# Patient Record
Sex: Male | Born: 1940 | Race: White | Hispanic: No | Marital: Married | Smoking: Current every day smoker
Health system: Southern US, Community
[De-identification: ages and names within clinical notes are randomized; demographics above are authoritative.]

## PROBLEM LIST (undated history)

## (undated) DIAGNOSIS — M1990 Unspecified osteoarthritis, unspecified site: Secondary | ICD-10-CM

## (undated) DIAGNOSIS — K219 Gastro-esophageal reflux disease without esophagitis: Secondary | ICD-10-CM

## (undated) DIAGNOSIS — I714 Abdominal aortic aneurysm, without rupture, unspecified: Secondary | ICD-10-CM

## (undated) DIAGNOSIS — I10 Essential (primary) hypertension: Secondary | ICD-10-CM

## (undated) DIAGNOSIS — R911 Solitary pulmonary nodule: Secondary | ICD-10-CM

## (undated) DIAGNOSIS — G459 Transient cerebral ischemic attack, unspecified: Secondary | ICD-10-CM

## (undated) DIAGNOSIS — E785 Hyperlipidemia, unspecified: Secondary | ICD-10-CM

## (undated) HISTORY — PX: MULTIPLE TOOTH EXTRACTIONS: SHX2053

## (undated) HISTORY — PX: CHOLECYSTECTOMY: SHX55

## (undated) HISTORY — PX: APPENDECTOMY: SHX54

## (undated) HISTORY — PX: BACK SURGERY: SHX140

---

## 2002-02-03 ENCOUNTER — Ambulatory Visit (HOSPITAL_COMMUNITY): Admission: RE | Admit: 2002-02-03 | Discharge: 2002-02-03 | Payer: Self-pay | Admitting: Internal Medicine

## 2003-06-24 ENCOUNTER — Ambulatory Visit (HOSPITAL_COMMUNITY): Admission: RE | Admit: 2003-06-24 | Discharge: 2003-06-24 | Payer: Self-pay | Admitting: Unknown Physician Specialty

## 2003-07-07 ENCOUNTER — Ambulatory Visit (HOSPITAL_COMMUNITY): Admission: RE | Admit: 2003-07-07 | Discharge: 2003-07-07 | Payer: Self-pay | Admitting: Unknown Physician Specialty

## 2003-08-19 ENCOUNTER — Encounter (INDEPENDENT_AMBULATORY_CARE_PROVIDER_SITE_OTHER): Payer: Self-pay | Admitting: *Deleted

## 2003-08-19 ENCOUNTER — Ambulatory Visit (HOSPITAL_COMMUNITY): Admission: RE | Admit: 2003-08-19 | Discharge: 2003-08-20 | Payer: Self-pay

## 2003-10-07 ENCOUNTER — Ambulatory Visit (HOSPITAL_COMMUNITY): Admission: RE | Admit: 2003-10-07 | Discharge: 2003-10-07 | Payer: Self-pay | Admitting: Unknown Physician Specialty

## 2004-01-07 ENCOUNTER — Ambulatory Visit (HOSPITAL_COMMUNITY): Admission: RE | Admit: 2004-01-07 | Discharge: 2004-01-07 | Payer: Self-pay | Admitting: Unknown Physician Specialty

## 2004-01-12 ENCOUNTER — Encounter: Admission: RE | Admit: 2004-01-12 | Discharge: 2004-02-10 | Payer: Self-pay | Admitting: Orthopaedic Surgery

## 2004-01-27 ENCOUNTER — Ambulatory Visit (HOSPITAL_COMMUNITY): Admission: RE | Admit: 2004-01-27 | Discharge: 2004-01-27 | Payer: Self-pay | Admitting: Family Medicine

## 2004-02-24 ENCOUNTER — Encounter: Admission: RE | Admit: 2004-02-24 | Discharge: 2004-02-24 | Payer: Self-pay | Admitting: Orthopaedic Surgery

## 2004-03-30 ENCOUNTER — Ambulatory Visit (HOSPITAL_COMMUNITY): Admission: RE | Admit: 2004-03-30 | Discharge: 2004-03-30 | Payer: Self-pay | Admitting: Orthopaedic Surgery

## 2004-04-27 ENCOUNTER — Ambulatory Visit: Payer: Self-pay | Admitting: Family Medicine

## 2004-06-01 ENCOUNTER — Ambulatory Visit: Payer: Self-pay | Admitting: Family Medicine

## 2004-07-11 ENCOUNTER — Ambulatory Visit (HOSPITAL_COMMUNITY): Admission: RE | Admit: 2004-07-11 | Discharge: 2004-07-11 | Payer: Self-pay | Admitting: Family Medicine

## 2004-07-28 ENCOUNTER — Ambulatory Visit: Payer: Self-pay | Admitting: Family Medicine

## 2004-09-13 ENCOUNTER — Ambulatory Visit: Payer: Self-pay | Admitting: Family Medicine

## 2004-10-04 ENCOUNTER — Ambulatory Visit: Payer: Self-pay | Admitting: Family Medicine

## 2004-12-29 ENCOUNTER — Ambulatory Visit: Payer: Self-pay | Admitting: Family Medicine

## 2005-01-25 ENCOUNTER — Ambulatory Visit: Payer: Self-pay | Admitting: Family Medicine

## 2005-02-03 ENCOUNTER — Ambulatory Visit (HOSPITAL_COMMUNITY): Admission: RE | Admit: 2005-02-03 | Discharge: 2005-02-03 | Payer: Self-pay | Admitting: Family Medicine

## 2005-03-14 ENCOUNTER — Ambulatory Visit: Payer: Self-pay | Admitting: Family Medicine

## 2005-05-02 ENCOUNTER — Ambulatory Visit: Payer: Self-pay | Admitting: Family Medicine

## 2005-05-10 ENCOUNTER — Ambulatory Visit (HOSPITAL_COMMUNITY): Admission: RE | Admit: 2005-05-10 | Discharge: 2005-05-10 | Payer: Self-pay | Admitting: Family Medicine

## 2005-05-23 ENCOUNTER — Ambulatory Visit: Payer: Self-pay | Admitting: Family Medicine

## 2005-08-29 ENCOUNTER — Ambulatory Visit: Payer: Self-pay | Admitting: Family Medicine

## 2005-12-05 ENCOUNTER — Ambulatory Visit: Payer: Self-pay | Admitting: Family Medicine

## 2005-12-27 ENCOUNTER — Ambulatory Visit (HOSPITAL_COMMUNITY): Admission: RE | Admit: 2005-12-27 | Discharge: 2005-12-27 | Payer: Self-pay | Admitting: Family Medicine

## 2006-01-02 ENCOUNTER — Ambulatory Visit (HOSPITAL_COMMUNITY): Admission: RE | Admit: 2006-01-02 | Discharge: 2006-01-02 | Payer: Self-pay | Admitting: Family Medicine

## 2006-02-09 ENCOUNTER — Ambulatory Visit: Payer: Self-pay | Admitting: Family Medicine

## 2006-04-12 ENCOUNTER — Ambulatory Visit: Payer: Self-pay | Admitting: Family Medicine

## 2006-05-24 ENCOUNTER — Ambulatory Visit: Payer: Self-pay | Admitting: Family Medicine

## 2006-08-22 ENCOUNTER — Ambulatory Visit: Payer: Self-pay | Admitting: Family Medicine

## 2007-07-11 ENCOUNTER — Ambulatory Visit (HOSPITAL_COMMUNITY): Admission: RE | Admit: 2007-07-11 | Discharge: 2007-07-11 | Payer: Self-pay | Admitting: Family Medicine

## 2007-08-06 ENCOUNTER — Ambulatory Visit (HOSPITAL_COMMUNITY): Admission: RE | Admit: 2007-08-06 | Discharge: 2007-08-06 | Payer: Self-pay | Admitting: Family Medicine

## 2007-11-06 ENCOUNTER — Ambulatory Visit (HOSPITAL_COMMUNITY): Admission: RE | Admit: 2007-11-06 | Discharge: 2007-11-06 | Payer: Self-pay | Admitting: Family Medicine

## 2008-06-11 ENCOUNTER — Ambulatory Visit (HOSPITAL_COMMUNITY): Admission: RE | Admit: 2008-06-11 | Discharge: 2008-06-11 | Payer: Self-pay | Admitting: Family Medicine

## 2009-08-03 ENCOUNTER — Ambulatory Visit: Payer: Self-pay | Admitting: Vascular Surgery

## 2010-05-01 ENCOUNTER — Encounter: Payer: Self-pay | Admitting: Family Medicine

## 2010-08-23 NOTE — Consult Note (Signed)
NEW PATIENT CONSULTATION   Raulerson, Jaidan B  DOB:  1941/01/26                                       08/03/2009  YQMVH#:[id number]   The patient is a 70 year old male patient seen by me several years ago  for a small infrarenal abdominal aortic aneurysm which at that time  measured 3.4 cm in maximum diameter.  He was referred today for  continued examination of aneurysmal disease by Dr. Lysbeth Galas who recently  obtained a CT scan at [facility] of the abdomen and chest.  I  have reviewed that CT scan today as well as the report and also have  reviewed all the medical records supplied by Dr. Lysbeth Galas.  The CT scan  today reveals the maximal infrarenal aortic diameter to be 3.5 cm with  no evidence of leak or rupture and no evidence of thoracic aneurysm.  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   CHRONIC MEDICAL PROBLEMS:  1. Hypertension.  2. Hyperlipidemia.  3. History of TIAs.  4. Tobacco abuse.  5. Lumbar spine disease.  6. GERD.   FAMILY HISTORY:  Positive for coronary artery disease with massive  myocardial infarction as well as diabetes in his mother, negative for  stroke.   SOCIAL HISTORY:  He is married, has three children and is a retired  Psychologist, occupational.  He smoked a pack and a half of cigarettes per day for 50+ years  and now smokes a half pack per day.  Does not use alcohol.   REVIEW OF SYSTEMS:  Positive for dyspnea on exertion, esophageal reflux  with heartburn, diarrhea, history of mini stroke consisting of dizziness  and diplopia now resolved.  History of diffuse arthritis, joint pain and  decreased hearing.  All other systems in review of systems are negative.   PHYSICAL EXAM:  Vital signs:  Blood pressure 140/76, heart rate 64,  temperature 98.  General:  He is a well-developed, well-nourished male  who is in no  apparent distress, alert and oriented x3.  HEENT:  Exam is  normal.  EOMs intact.  Chest:  Clear to auscultation.  No wheezing.  Cardiovascular:  Regular rhythm.  No murmurs.  Carotid pulses 3+.  No  audible bruits.  Lower extremity exam reveals 3+ femoral and dorsalis  pedis pulses bilaterally with no edema.  Abdomen:  Soft with a prominent  diastasis recti.  No pulsatile masses palpable.  No tenderness.  Musculoskeletal:  Free of major deformities.  Neurological:  Normal.  Skin:  Free of rashes.   I think this patient does have a stable infrarenal abdominal aortic  aneurysm and he has no other symptoms of vascular occlusive disease at  this time.  We will follow him every 2 years in our vascular lab on the  aneurysm protocol with an ultrasound to check for any enlargement unless  he develops any new symptoms in the interim.     Quita Skye Hart [place], M.D.  Electronically Signed   JDL/MEDQ  D:  08/03/2009  T:  08/04/2009  Job:  [id number]   cc:   Delaney Meigs, M.D.

## 2010-08-26 NOTE — Op Note (Signed)
NAME:  Andrew Hunt, Andrew Hunt                        ACCOUNT NO.:  [id number]   MEDICAL RECORD NO.:  [id number]                   PATIENT TYPE:  AMB   LOCATION:  DAY                                  FACILITY:  [facility]   PHYSICIAN:  R. Roetta Sessions, M.D.              DATE OF BIRTH:  [birth year]   DATE OF PROCEDURE:  02/03/2002  DATE OF DISCHARGE:                                 OPERATIVE REPORT   PROCEDURE:  Screening colonoscopy.   ENDOSCOPIST:  Gerrit Friends. Rourk, M.D.   INDICATIONS FOR PROCEDURE:  The patient is a 70 year old male who comes for  colon cancer screening.  He has some left-sided abdominal pain and thought  to have diverticula previously. He was last seen 01/02/02 in my office.  He  was having some suprapubic pain and he underwent a CT scan which revealed  some diverticula tags, but no evidence of diverticulitis or other  significant abnormality.  He comes for a screening colonoscopy.  This  approach has been discussed with the patient previously, and again at the  bedside.  Please see my office dictation of 01/02/02 and handwritten update  and H&P on 02/03/02.  ASA II.   DESCRIPTION OF PROCEDURE:  O2 saturation, blood pressure, pulse and  respirations were monitored throughout the entire procedure.   CONSCIOUS SEDATION:  Demerol 75 mg IV and Versed 3 gm IV in divided doses.   INSTRUMENT:  Olympus video chip colonoscope.   FINDINGS:  Digital rectal exam revealed no abnormalities.   ENDOSCOPIC FINDINGS:  The prep was adequate.   RECTUM:  Examination of the rectal mucosa including the retroflex view of  the anal verge revealed no abnormalities.   COLON:  The colonic mucosa was surveyed from the rectosigmoid junction  through the left transverse and right colon to the area of the appendiceal  orifice, ileocecal valve/cecum.  These structures were well seen and  photographed for the record.  The patient was noted to have a few scattered  left-sided diverticula.  The  remainder of the colonic mucosa appeared  normal.   From the level of the cecum and ileocecal valve the scope was slowly  withdrawn; and all previously mentioned mucosal surfaces were again seen.  No abnormalities were observed.  The patient tolerated the procedure well  and was reacted in endoscopy.   IMPRESSION:  1. Normal rectum.  2. Scattered left-sided diverticula, the remainder fo the colonic mucosa     appeared normal.   RECOMMENDATIONS:  1. Diverticulosis literature provided to the patient. He is to increase the     fiber intake in his diet daily.  2. Repeat colonoscopy in 10 years.  Jonathon Bellows, M.D.    RMR/MEDQ  D:  02/03/2002  T:  02/03/2002  Job:  [id number]   cc:   Colon Flattery  [address]  [place]  [place] [address]  Fax: [phone]

## 2010-08-26 NOTE — Op Note (Signed)
NAME:  Andrew Hunt, Andrew Hunt                        ACCOUNT NO.:  [id number]   MEDICAL RECORD NO.:  [id number]                   PATIENT TYPE:  OIB   LOCATION:  5707                                 FACILITY:  [facility]   PHYSICIAN:  Lorre Munroe., M.D.            DATE OF BIRTH:  1940/08/10   DATE OF PROCEDURE:  08/19/2003  DATE OF DISCHARGE:                                 OPERATIVE REPORT   PREOPERATIVE DIAGNOSIS:  Symptomatic gallstones.   POSTOPERATIVE DIAGNOSIS:  Symptomatic gallstones.   OPERATION PERFORMED:  Laparoscopic cholecystectomy.   SURGEON:  Lebron Conners, M.D.   ASSISTANT:  Anselm Pancoast. Zachery Dakins, M.D.   ANESTHESIA:  General.   DESCRIPTION OF PROCEDURE:  After the patient was monitored and anesthetized  and had routine preparation and draping of the abdomen, I infiltrated local  anesthetic just beneath the umbilicus at one spot in the epigastrium and two  spots in the right mid-abdomen.  I made incisions at all of those locations  and first dissected down through the subcutaneous tissues just below the  umbilicus and incised the fascia longitudinally in the midline, entered the  peritoneum bluntly and placed a 0 Vicryl pursestring suture in the fascia.  After securing a Hasson cannula, I inflated the abdomen with CO2 and  examined the contents.  I saw no abnormalities except for adhesions of  omentum to the undersurface of the gallbladder.  I placed the three  additional ports under direct vision then retracted the fundus of the  gallbladder toward the right shoulder and took down the adhesions bluntly  and with cautery.  I then grasped the infundibulum of the gallbladder and  pulled it laterally and dissected out the triangle of Calot, noting the  cystic artery and clipped two branches with three clips each and cut each  between the two clips closest to the gallbladder.  I had a very large window  in the triangle of Calot and I could clearly see the cystic duct  emerging  from the infundibulum of the gallbladder.  I clipped the cystic duct with  four clips and cut between the two closest to the gallbladder.  I then  dissected the gallbladder from the liver using the hook cautery instrument.  I removed the gallbladder intact.  There was no significant bleeding and  hemostasis was excellent when I finished.  The clips appeared to be secure.  I removed the gallbladder from the body through the umbilical incision and  tied the pursestring suture.  I then  removed the lateral ports under direct vision and let the CO2 escape from  the epigastric port and removed it.  I closed all skin incisions with  intracuticular 4-0 Vicryl and Steri-Strips and applied bandages.  The  patient tolerated the operation well.  Lorre Munroe., M.D.    WB/MEDQ  D:  08/19/2003  T:  08/19/2003  Job:  [id number]   cc:   Colon Flattery, D.O.  [address]  [place]  [place] [address]  Fax: [phone]

## 2011-10-31 ENCOUNTER — Other Ambulatory Visit (HOSPITAL_COMMUNITY): Payer: Self-pay | Admitting: Adult Health Nurse Practitioner

## 2011-10-31 DIAGNOSIS — I714 Abdominal aortic aneurysm, without rupture: Secondary | ICD-10-CM

## 2011-11-02 ENCOUNTER — Ambulatory Visit (HOSPITAL_COMMUNITY)
Admission: RE | Admit: 2011-11-02 | Discharge: 2011-11-02 | Disposition: A | Discharge status: Home / Self Care | Payer: Medicare Other | Source: Ambulatory Visit | Attending: Adult Health Nurse Practitioner | Admitting: Adult Health Nurse Practitioner

## 2011-11-02 ENCOUNTER — Encounter (HOSPITAL_COMMUNITY): Payer: Self-pay

## 2011-11-02 DIAGNOSIS — I714 Abdominal aortic aneurysm, without rupture, unspecified: Secondary | ICD-10-CM | POA: Insufficient documentation

## 2011-11-02 MED ORDER — IOHEXOL 300 MG/ML  SOLN
100.0000 mL | Freq: Once | INTRAMUSCULAR | Status: AC | PRN
  Administered 2011-11-02: 100 mL via INTRAVENOUS

## 2013-06-19 ENCOUNTER — Telehealth: Payer: Self-pay

## 2013-06-19 ENCOUNTER — Telehealth: Payer: Self-pay | Admitting: Internal Medicine

## 2013-06-19 NOTE — Telephone Encounter (Signed)
Gastroenterology Pre-Procedure Review  Request Date: Requesting Physician: RMRM  PATIENT REVIEW QUESTIONS: The patient responded to the following health history questions as indicated:    1. Diabetes Melitis: NO 2. Joint replacements in the past 12 months: NO 3. Major health problems in the past 3 months: NO 4. Has an artificial valve or MVP: NO 5. Has a defibrillator: NO 6. Has been advised in past to take antibiotics in advance of a procedure like teeth cleaning: NO 7.Family history: NO 8. Acholic: NO     MEDICATIONS & ALLERGIES:   Mobic-  nausea Patient reports the following regarding taking any blood thinners:   Plavix? NO-stop in October Aspirin? NO Coumadin? NO  Patient confirms/reports the following medications:  No current outpatient prescriptions on file.   No current facility-administered medications for this visit.    Patient confirms/reports the following allergies:  Mobic  No orders of the defined types were placed in this encounter.    AUTHORIZATION INFORMATION Primary Insurance: Medicare  ID #:[id number] ,  Group #:  Pre-Cert / Auth required: Pre-Cert / Auth #:   Secondary Insurance: Medicaid,  ID #:[id number] , Group #:  Pre-Cert / Auth required:  Pre-Cert / Auth #:  SCHEDULE INFORMATION: Procedure has been scheduled as follows:  Date: , Time:   Location:   This Gastroenterology Pre-Precedure Review Form is being routed to the following provider(s):

## 2013-06-19 NOTE — Telephone Encounter (Signed)
Pt's wife was calling to set up colonoscopy and she said he was past due. Not sure if he needs OV first or if he can be a triage. Please advise [phone]

## 2013-06-23 ENCOUNTER — Other Ambulatory Visit: Payer: Self-pay

## 2013-06-23 ENCOUNTER — Encounter (HOSPITAL_COMMUNITY): Payer: Self-pay | Admitting: Pharmacy Technician

## 2013-06-23 DIAGNOSIS — Z1211 Encounter for screening for malignant neoplasm of colon: Secondary | ICD-10-CM

## 2013-06-23 NOTE — Telephone Encounter (Signed)
Pt is scheduled for tcs on 07/08/13 at 10:30am.  Pts wife is aware of appt.   Doris, please send Rx to [facility] in [place].   Routing to [place] for completion.

## 2013-06-23 NOTE — Telephone Encounter (Signed)
LMOM for pt to call to schedule the colonoscopy.  

## 2013-06-23 NOTE — Telephone Encounter (Signed)
See separate triage.  

## 2013-06-24 NOTE — Telephone Encounter (Signed)
Appropriate.

## 2013-06-25 MED ORDER — PEG-KCL-NACL-NASULF-NA ASC-C 100 G PO SOLR: 1.0000 | ORAL | Status: DC

## 2013-06-25 NOTE — Telephone Encounter (Signed)
Rx sent to the pharmacy and instructions mailed to pt.  

## 2013-06-25 NOTE — Addendum Note (Signed)
Addended by: Everardo All on: 06/25/2013 04:38 PM   Modules accepted: Orders

## 2013-07-07 ENCOUNTER — Telehealth: Payer: Self-pay | Admitting: Internal Medicine

## 2013-07-07 NOTE — Telephone Encounter (Signed)
LMOM to cancel his procedure for tomorrow with RMR due to being sick. VM transferred to nurse that scheduled patient

## 2013-07-07 NOTE — Telephone Encounter (Signed)
I called pt's wife. She said he has a very bad cough and congestion. She canceled his colonoscopy for 07/08/2013 and said she will call back to reschedule after he sees doctor. I called and LMOM for Maudie Mercury that it has been canceled and asked her to call me back to confirm.

## 2013-07-07 NOTE — Telephone Encounter (Signed)
Andrew Hunt returned my call and is aware.

## 2013-07-08 ENCOUNTER — Encounter (HOSPITAL_COMMUNITY): Admission: RE | Payer: Self-pay | Source: Ambulatory Visit

## 2013-07-08 ENCOUNTER — Ambulatory Visit (HOSPITAL_COMMUNITY): Admission: RE | Admit: 2013-07-08 | Payer: Medicare Other | Source: Ambulatory Visit | Admitting: Internal Medicine

## 2013-07-08 SURGERY — COLONOSCOPY
Anesthesia: Moderate Sedation

## 2014-04-22 ENCOUNTER — Ambulatory Visit: Payer: Self-pay | Admitting: Nurse Practitioner

## 2014-04-22 ENCOUNTER — Telehealth: Payer: Self-pay

## 2014-04-22 ENCOUNTER — Encounter: Payer: Self-pay | Admitting: Nurse Practitioner

## 2014-04-22 ENCOUNTER — Other Ambulatory Visit: Payer: Self-pay

## 2014-04-22 VITALS — BP 158/72 | HR 50 | Temp 98.1°F | Ht 66.0 in | Wt 164.0 lb

## 2014-04-22 DIAGNOSIS — Z1211 Encounter for screening for malignant neoplasm of colon: Secondary | ICD-10-CM

## 2014-04-22 NOTE — Telephone Encounter (Addendum)
Gastroenterology Pre-Procedure Review  Request Date:04/22/2014 Requesting Physician: HAD BEEN ON RECALL LAST COLONOSCOPY WAS 02/03/2002 BY DR. Gala Romney AND NEXT RECOMMENDED IN 10 YEARS  PATIENT REVIEW QUESTIONS: The patient responded to the following health history questions as indicated:    1. Diabetes Melitis: no 2. Joint replacements in the past 12 months: no 3. Major health problems in the past 3 months: no 4. Has an artificial valve or MVP: no 5. Has a defibrillator: no 6. Has been advised in past to take antibiotics in advance of a procedure like teeth cleaning: no    MEDICATIONS & ALLERGIES:    Patient reports the following regarding taking any blood thinners:   Plavix? no Aspirin? Yes Coumadin? no  Patient confirms/reports the following medications:  Current Outpatient Prescriptions  Medication Sig Dispense Refill  . amLODipine (NORVASC) 10 MG tablet Take 5 mg by mouth.     Marland Kitchen aspirin EC 81 MG tablet Take 81 mg by mouth every other day.    Marland Kitchen atorvastatin (LIPITOR) 40 MG tablet Take 40 mg by mouth daily.    . nebivolol (BYSTOLIC) 10 MG tablet Take 10 mg by mouth daily.    Marland Kitchen omeprazole (PRILOSEC) 20 MG capsule Take 20 mg by mouth daily.    . tamsulosin (FLOMAX) 0.4 MG CAPS capsule Take 0.4 mg by mouth daily.    . Glucosamine-Chondroit-Vit C-Mn (GLUCOSAMINE 1500 COMPLEX PO) Take 1,500 mg by mouth daily.    . peg 3350 powder (MOVIPREP) 100 G SOLR Take 1 kit (200 g total) by mouth as directed. (Patient not taking: Reported on 04/22/2014) 1 kit 0  . valsartan (DIOVAN) 320 MG tablet Take 320 mg by mouth daily.     No current facility-administered medications for this visit.    Patient confirms/reports the following allergies:  Allergies  Allergen Reactions  . Mobic [Meloxicam] Nausea Only    No orders of the defined types were placed in this encounter.    AUTHORIZATION INFORMATION Primary Insurance:   ID #:  Group #:  Pre-Cert / Auth required: Pre-Cert / Auth #:    Secondary Insurance:   ID #:   Group #:  Pre-Cert / Auth required:  Pre-Cert / Auth #:   SCHEDULE INFORMATION: Procedure has been scheduled as follows:  Date: 05/05/2014             Time: 1:00 pm  Location: [facility] Short Stay  This Gastroenterology Pre-Precedure Review Form is being routed to the following provider(s): R. Garfield Cornea, MD

## 2014-04-24 NOTE — Telephone Encounter (Signed)
Ok to schedule. Ok to continue to take ASA

## 2014-04-28 NOTE — Telephone Encounter (Signed)
Pt received instructions when he was in the office to schedule the colonoscopy.  He already had a prep and it is good til 2018. He did not open it when he had to cancel before.

## 2014-04-30 DIAGNOSIS — Z1211 Encounter for screening for malignant neoplasm of colon: Secondary | ICD-10-CM | POA: Insufficient documentation

## 2014-04-30 NOTE — Assessment & Plan Note (Signed)
No visit needed, phone triage completed. Visit cancelled with no charge

## 2014-04-30 NOTE — Progress Notes (Signed)
Note opened in error, patient visit unnecessary, phone triage completed.Andrew Hunt

## 2014-05-05 ENCOUNTER — Ambulatory Visit (HOSPITAL_COMMUNITY)
Admission: RE | Admit: 2014-05-05 | Discharge: 2014-05-05 | Disposition: A | Discharge status: Home / Self Care | Payer: Medicare Other | Source: Ambulatory Visit | Attending: Internal Medicine | Admitting: Internal Medicine

## 2014-05-05 ENCOUNTER — Encounter (HOSPITAL_COMMUNITY)
Admission: RE | Disposition: A | Discharge status: Home / Self Care | Payer: Self-pay | Source: Ambulatory Visit | Attending: Internal Medicine

## 2014-05-05 ENCOUNTER — Encounter (HOSPITAL_COMMUNITY): Payer: Self-pay | Admitting: *Deleted

## 2014-05-05 DIAGNOSIS — Z79899 Other long term (current) drug therapy: Secondary | ICD-10-CM | POA: Diagnosis not present

## 2014-05-05 DIAGNOSIS — M1990 Unspecified osteoarthritis, unspecified site: Secondary | ICD-10-CM | POA: Insufficient documentation

## 2014-05-05 DIAGNOSIS — D120 Benign neoplasm of cecum: Secondary | ICD-10-CM | POA: Diagnosis not present

## 2014-05-05 DIAGNOSIS — E785 Hyperlipidemia, unspecified: Secondary | ICD-10-CM | POA: Insufficient documentation

## 2014-05-05 DIAGNOSIS — I10 Essential (primary) hypertension: Secondary | ICD-10-CM | POA: Diagnosis not present

## 2014-05-05 DIAGNOSIS — Z1211 Encounter for screening for malignant neoplasm of colon: Secondary | ICD-10-CM | POA: Diagnosis present

## 2014-05-05 DIAGNOSIS — F17210 Nicotine dependence, cigarettes, uncomplicated: Secondary | ICD-10-CM | POA: Diagnosis not present

## 2014-05-05 DIAGNOSIS — K648 Other hemorrhoids: Secondary | ICD-10-CM | POA: Insufficient documentation

## 2014-05-05 DIAGNOSIS — K219 Gastro-esophageal reflux disease without esophagitis: Secondary | ICD-10-CM | POA: Insufficient documentation

## 2014-05-05 DIAGNOSIS — Z8673 Personal history of transient ischemic attack (TIA), and cerebral infarction without residual deficits: Secondary | ICD-10-CM | POA: Insufficient documentation

## 2014-05-05 DIAGNOSIS — Z7982 Long term (current) use of aspirin: Secondary | ICD-10-CM | POA: Insufficient documentation

## 2014-05-05 DIAGNOSIS — Z86010 Personal history of colonic polyps: Secondary | ICD-10-CM | POA: Insufficient documentation

## 2014-05-05 HISTORY — DX: Essential (primary) hypertension: I10

## 2014-05-05 HISTORY — DX: Gastro-esophageal reflux disease without esophagitis: K21.9

## 2014-05-05 HISTORY — PX: COLONOSCOPY: SHX5424

## 2014-05-05 HISTORY — DX: Unspecified osteoarthritis, unspecified site: M19.90

## 2014-05-05 HISTORY — DX: Transient cerebral ischemic attack, unspecified: G45.9

## 2014-05-05 HISTORY — DX: Hyperlipidemia, unspecified: E78.5

## 2014-05-05 SURGERY — COLONOSCOPY
Anesthesia: Moderate Sedation

## 2014-05-05 MED ORDER — MIDAZOLAM HCL 5 MG/5ML IJ SOLN
INTRAMUSCULAR | Status: AC
  Filled 2014-05-05: qty 10

## 2014-05-05 MED ORDER — SODIUM CHLORIDE 0.9 % IV SOLN
INTRAVENOUS | Status: DC
  Administered 2014-05-05: 1000 mL via INTRAVENOUS

## 2014-05-05 MED ORDER — MEPERIDINE HCL 100 MG/ML IJ SOLN
INTRAMUSCULAR | Status: AC
  Filled 2014-05-05: qty 2

## 2014-05-05 MED ORDER — MIDAZOLAM HCL 5 MG/5ML IJ SOLN
INTRAMUSCULAR | Status: DC | PRN
  Administered 2014-05-05 (×2): 1 mg via INTRAVENOUS
  Administered 2014-05-05: 2 mg via INTRAVENOUS
  Administered 2014-05-05: 1 mg via INTRAVENOUS

## 2014-05-05 MED ORDER — STERILE WATER FOR IRRIGATION IR SOLN
Status: DC | PRN
  Administered 2014-05-05: 14:00:00

## 2014-05-05 MED ORDER — MEPERIDINE HCL 100 MG/ML IJ SOLN
INTRAMUSCULAR | Status: DC | PRN
  Administered 2014-05-05 (×2): 25 mg via INTRAVENOUS

## 2014-05-05 MED ORDER — ONDANSETRON HCL 4 MG/2ML IJ SOLN
INTRAMUSCULAR | Status: AC
  Filled 2014-05-05: qty 2

## 2014-05-05 MED ORDER — ONDANSETRON HCL 4 MG/2ML IJ SOLN
INTRAMUSCULAR | Status: DC | PRN
  Administered 2014-05-05: 4 mg via INTRAVENOUS

## 2014-05-05 NOTE — Discharge Instructions (Addendum)
°Colonoscopy °Discharge Instructions ° °Read the instructions outlined below and refer to this sheet in the next few weeks. These discharge instructions provide you with general information on caring for yourself after you leave the hospital. Your doctor may also give you specific instructions. While your treatment has been planned according to the most current medical practices available, unavoidable complications occasionally occur. If you have any problems or questions after discharge, call Dr. Rourk at [phone]. °ACTIVITY °· You may resume your regular activity, but move at a slower pace for the next 24 hours.  °· Take frequent rest periods for the next 24 hours.  °· Walking will help get rid of the air and reduce the bloated feeling in your belly (abdomen).  °· No driving for 24 hours (because of the medicine (anesthesia) used during the test).   °· Do not sign any important legal documents or operate any machinery for 24 hours (because of the anesthesia used during the test).  °NUTRITION °· Drink plenty of fluids.  °· You may resume your normal diet as instructed by your doctor.  °· Begin with a light meal and progress to your normal diet. Heavy or fried foods are harder to digest and may make you feel sick to your stomach (nauseated).  °· Avoid alcoholic beverages for 24 hours or as instructed.  °MEDICATIONS °· You may resume your normal medications unless your doctor tells you otherwise.  °WHAT YOU CAN EXPECT TODAY °· Some feelings of bloating in the abdomen.  °· Passage of more gas than usual.  °· Spotting of blood in your stool or on the toilet paper.  °IF YOU HAD POLYPS REMOVED DURING THE COLONOSCOPY: °· No aspirin products for 7 days or as instructed.  °· No alcohol for 7 days or as instructed.  °· Eat a soft diet for the next 24 hours.  °FINDING OUT THE RESULTS OF YOUR TEST °Not all test results are available during your visit. If your test results are not back during the visit, make an appointment  with your caregiver to find out the results. Do not assume everything is normal if you have not heard from your caregiver or the medical facility. It is important for you to follow up on all of your test results.  °SEEK IMMEDIATE MEDICAL ATTENTION IF: °· You have more than a spotting of blood in your stool.  °· Your belly is swollen (abdominal distention).  °· You are nauseated or vomiting.  °· You have a temperature over 101.  °· You have abdominal pain or discomfort that is severe or gets worse throughout the day.  ° ° °Polyp information provided ° °Further recommendations to follow pending review of pathology report ° °Colon Polyps °Polyps are lumps of extra tissue growing inside the body. Polyps can grow in the large intestine (colon). Most colon polyps are noncancerous (benign). However, some colon polyps can become cancerous over time. Polyps that are larger than a pea may be harmful. To be safe, caregivers remove and test all polyps. °CAUSES  °Polyps form when mutations in the genes cause your cells to grow and divide even though no more tissue is needed. °RISK FACTORS °There are a number of risk factors that can increase your chances of getting colon polyps. They include: °· Being older than 50 years. °· Family history of colon polyps or colon cancer. °· Long-term colon diseases, such as colitis or Crohn disease. °· Being overweight. °· Smoking. °· Being inactive. °· Drinking too much alcohol. °SYMPTOMS  °  Most small polyps do not cause symptoms. If symptoms are present, they may include: °· Blood in the stool. The stool may look dark red or black. °· Constipation or diarrhea that lasts longer than 1 week. °DIAGNOSIS °People often do not know they have polyps until their caregiver finds them during a regular checkup. Your caregiver can use 4 tests to check for polyps: °· Digital rectal exam. The caregiver wears gloves and feels inside the rectum. This test would find polyps only in the rectum. °· Barium  enema. The caregiver puts a liquid called barium into your rectum before taking X-rays of your colon. Barium makes your colon look white. Polyps are dark, so they are easy to see in the X-ray pictures. °· Sigmoidoscopy. A thin, flexible tube (sigmoidoscope) is placed into your rectum. The sigmoidoscope has a light and tiny camera in it. The caregiver uses the sigmoidoscope to look at the last third of your colon. °· Colonoscopy. This test is like sigmoidoscopy, but the caregiver looks at the entire colon. This is the most common method for finding and removing polyps. °TREATMENT  °Any polyps will be removed during a sigmoidoscopy or colonoscopy. The polyps are then tested for cancer. °PREVENTION  °To help lower your risk of getting more colon polyps: °· Eat plenty of fruits and vegetables. Avoid eating fatty foods. °· Do not smoke. °· Avoid drinking alcohol. °· Exercise every day. °· Lose weight if recommended by your caregiver. °· Eat plenty of calcium and folate. Foods that are rich in calcium include milk, cheese, and broccoli. Foods that are rich in folate include chickpeas, kidney beans, and spinach. °HOME CARE INSTRUCTIONS °Keep all follow-up appointments as directed by your caregiver. You may need periodic exams to check for polyps. °SEEK MEDICAL CARE IF: °You notice bleeding during a bowel movement. °Document Released: 12/22/2003 Document Revised: 06/19/2011 Document Reviewed: 06/06/2011 °[employer]® Patient Information ©2015 [employer]. This information is not intended to replace advice given to you by your health care provider. Make sure you discuss any questions you have with your health care provider. ° °

## 2014-05-05 NOTE — H&P (Addendum)
@LOGO @   Primary Care Physician:  Sherrie Mustache, MD Primary Gastroenterologist:  Dr.   Pre-Procedure History & Physical: HPI:  Andrew Hunt is a 74 y.o. male is here for a screening colonoscopy. No bowel symptoms. No family history of colon cancer. Last colonoscopy -13 years ago.  Past Medical History  Diagnosis Date  . Hypertension   . TIA (transient ischemic attack)   . Hyperlipidemia   . GERD (gastroesophageal reflux disease)   . Arthritis     Past Surgical History  Procedure Laterality Date  . Appendectomy    . Cholecystectomy    . Back surgery    . Multiple tooth extractions      Prior to Admission medications   Medication Sig Start Date End Date Taking? Authorizing Provider  amLODipine (NORVASC) 10 MG tablet Take 5 mg by mouth daily.    Yes Historical Provider, MD  aspirin EC 81 MG tablet Take 81 mg by mouth every other day.   Yes Historical Provider, MD  atorvastatin (LIPITOR) 40 MG tablet Take 40 mg by mouth daily.   Yes Historical Provider, MD  Glucosamine-Chondroit-Vit C-Mn (GLUCOSAMINE 1500 COMPLEX PO) Take 1,500 mg by mouth daily.   Yes Historical Provider, MD  nebivolol (BYSTOLIC) 10 MG tablet Take 10 mg by mouth daily.   Yes Historical Provider, MD  omeprazole (PRILOSEC) 20 MG capsule Take 20 mg by mouth daily.   Yes Historical Provider, MD  tamsulosin (FLOMAX) 0.4 MG CAPS capsule Take 0.4 mg by mouth daily.   Yes Historical Provider, MD  valsartan (DIOVAN) 320 MG tablet Take 320 mg by mouth daily.   Yes Historical Provider, MD  peg 3350 powder (MOVIPREP) 100 G SOLR Take 1 kit (200 g total) by mouth as directed. Patient not taking: Reported on 04/22/2014 06/25/13   Daneil Dolin, MD    Allergies as of 04/22/2014 - Review Complete 04/22/2014  Allergen Reaction Noted  . Mobic [meloxicam] Nausea Only 06/19/2013    History reviewed. No pertinent family history.  History   Social History  . Marital Status: Married    Spouse Name: N/A    Number  of Children: N/A  . Years of Education: N/A   Occupational History  . Not on file.   Social History Main Topics  . Smoking status: Current Every Day Smoker -- 1.00 packs/day for 62 years    Types: Cigarettes  . Smokeless tobacco: Not on file  . Alcohol Use: No  . Drug Use: No  . Sexual Activity: Not on file   Other Topics Concern  . Not on file   Social History Narrative    Review of Systems: See HPI, otherwise negative ROS  Physical Exam: BP 159/70 mmHg  Temp(Src) 97.6 F (36.4 C) (Oral)  Resp 18  Ht 5' 7"  (1.702 m)  Wt 165 lb (74.844 kg)  BMI 25.84 kg/m2  SpO2 92% General:   Alert,  Well-developed, well-nourished, pleasant and cooperative in NAD Head:  Normocephalic and atraumatic. Eyes:  Sclera clear, no icterus.   Conjunctiva pink. Ears:  Normal auditory acuity. Nose:  No deformity, discharge,  or lesions. Mouth:  No deformity or lesions, dentition normal. Neck:  Supple; no masses or thyromegaly. Lungs:  Clear throughout to auscultation.   No wheezes, crackles, or rhonchi. No acute distress. Heart:  Regular rate and rhythm; no murmurs, clicks, rubs,  or gallops. Abdomen:  Soft, nontender and nondistended. No masses, hepatosplenomegaly or hernias noted. Normal bowel sounds, without guarding, and without rebound.  Msk:  Symmetrical without gross deformities. Normal posture. Pulses:  Normal pulses noted. Extremities:  Without clubbing or edema. Neurologic:  Alert and  oriented x4;  grossly normal neurologically. Skin:  Intact without significant lesions or rashes. Cervical Nodes:  No significant cervical adenopathy. Psych:  Alert and cooperative. Normal mood and affect.  Impression/Plan: Andrew Hunt is now here to undergo a screening colonoscopy.  Average risk screening examination  Risks, benefits, limitations, imponderables and alternatives regarding colonoscopy have been reviewed with the patient. Questions have been answered. All parties agreeable.      Notice:  This dictation was prepared with Dragon dictation along with smaller phrase technology. Any transcriptional errors that result from this process are unintentional and may not be corrected upon review.

## 2014-05-05 NOTE — Op Note (Signed)
[facility] [address] [place], [address]   COLONOSCOPY PROCEDURE REPORT  PATIENT: Andrew Hunt, Andrew Hunt  MR#: [id number] BIRTHDATE: [birth year] , 89  yrs. old GENDER: male ENDOSCOPIST: R.  Garfield Cornea, MD FACP [facility] REFERRED [id number] Edrick Oh, M.D. PROCEDURE DATE:  2014/05/10 PROCEDURE:   Colonoscopy with biopsy INDICATIONS:Average risk colorectal cancer screening examination. MEDICATIONS: Versed 5 mg IV and Demerol 50 mg IV in divided doses. Zofran 4 mg IV. ASA CLASS:       Class II  CONSENT: The risks, benefits, alternatives and imponderables including but not limited to bleeding, perforation as well as the possibility of a missed lesion have been reviewed.  The potential for biopsy, lesion removal, etc. have also been discussed. Questions have been answered.  All parties agreeable.  Please see the history and physical in the medical record for more information.  DESCRIPTION OF PROCEDURE:   After the risks benefits and alternatives of the procedure were thoroughly explained, informed consent was obtained.  The digital rectal exam revealed no abnormalities of the rectum.   The EC-[id number] ([id number])  endoscope was introduced through the anus and advanced to the cecum, which was identified by both the appendix and ileocecal valve. No adverse events experienced.   The quality of the prep was adequate.  The instrument was then slowly withdrawn as the colon was fully examined.      COLON FINDINGS: Internal hemorrhoids; otherwise, normal rectum.  (2) diminutive cecal polyps; otherwise, the remainder of the colonic mucosa appeared normal.  Retroflexion was performed. .  Withdrawal time=13 minutes 0 seconds.  The scope was withdrawn and the procedure completed. COMPLICATIONS: There were no immediate complications.  ENDOSCOPIC IMPRESSION: Colonic polyps?"removed as described above.  RECOMMENDATIONS: Follow up on pathology.  eSigned:  R. Garfield Cornea, MD  Rosalita Chessman Marval Regal 2014-05-10 2:21 PM   cc:  CPT CODES: ICD CODES:  The ICD and CPT codes recommended by this software are interpretations from the data that the clinical staff has captured with the software.  The verification of the translation of this report to the ICD and CPT codes and modifiers is the sole responsibility of the health care institution and practicing physician where this report was generated.  [employer] will not be held responsible for the validity of the ICD and CPT codes included on this report.  AMA assumes no liability for data contained or not contained herein. CPT is a Designer, television/film set of the [employer].

## 2014-05-06 ENCOUNTER — Encounter (HOSPITAL_COMMUNITY): Payer: Self-pay | Admitting: Internal Medicine

## 2014-05-08 ENCOUNTER — Encounter: Payer: Self-pay | Admitting: Internal Medicine

## 2016-12-16 ENCOUNTER — Emergency Department (HOSPITAL_COMMUNITY)
Admission: EM | Admit: 2016-12-16 | Discharge: 2016-12-16 | Disposition: A | Discharge status: Home / Self Care | Payer: Medicare Other | Attending: Emergency Medicine | Admitting: Emergency Medicine

## 2016-12-16 ENCOUNTER — Encounter (HOSPITAL_COMMUNITY): Payer: Self-pay

## 2016-12-16 DIAGNOSIS — Z79899 Other long term (current) drug therapy: Secondary | ICD-10-CM | POA: Insufficient documentation

## 2016-12-16 DIAGNOSIS — F17219 Nicotine dependence, cigarettes, with unspecified nicotine-induced disorders: Secondary | ICD-10-CM | POA: Diagnosis not present

## 2016-12-16 DIAGNOSIS — Y929 Unspecified place or not applicable: Secondary | ICD-10-CM | POA: Diagnosis not present

## 2016-12-16 DIAGNOSIS — Z7982 Long term (current) use of aspirin: Secondary | ICD-10-CM | POA: Insufficient documentation

## 2016-12-16 DIAGNOSIS — X58XXXA Exposure to other specified factors, initial encounter: Secondary | ICD-10-CM | POA: Diagnosis not present

## 2016-12-16 DIAGNOSIS — Y999 Unspecified external cause status: Secondary | ICD-10-CM | POA: Diagnosis not present

## 2016-12-16 DIAGNOSIS — S199XXA Unspecified injury of neck, initial encounter: Secondary | ICD-10-CM | POA: Diagnosis present

## 2016-12-16 DIAGNOSIS — S29012A Strain of muscle and tendon of back wall of thorax, initial encounter: Secondary | ICD-10-CM

## 2016-12-16 DIAGNOSIS — I10 Essential (primary) hypertension: Secondary | ICD-10-CM | POA: Diagnosis not present

## 2016-12-16 DIAGNOSIS — Y939 Activity, unspecified: Secondary | ICD-10-CM | POA: Diagnosis not present

## 2016-12-16 DIAGNOSIS — M542 Cervicalgia: Secondary | ICD-10-CM

## 2016-12-16 HISTORY — DX: Solitary pulmonary nodule: R91.1

## 2016-12-16 HISTORY — DX: Abdominal aortic aneurysm, without rupture, unspecified: I71.40

## 2016-12-16 HISTORY — DX: Abdominal aortic aneurysm, without rupture: I71.4

## 2016-12-16 MED ORDER — HYDROCODONE-ACETAMINOPHEN 5-325 MG PO TABS
2.0000 | ORAL_TABLET | Freq: Once | ORAL | Status: AC
  Administered 2016-12-16: 2 via ORAL
  Filled 2016-12-16: qty 2

## 2016-12-16 MED ORDER — NAPROXEN 500 MG PO TABS: 500.0000 mg | ORAL_TABLET | Freq: Two times a day (BID) | ORAL | 0 refills | Status: DC

## 2016-12-16 MED ORDER — METHOCARBAMOL 500 MG PO TABS
500.0000 mg | ORAL_TABLET | Freq: Once | ORAL | Status: AC
  Administered 2016-12-16: 500 mg via ORAL
  Filled 2016-12-16: qty 1

## 2016-12-16 MED ORDER — METHOCARBAMOL 500 MG PO TABS: 500.0000 mg | ORAL_TABLET | Freq: Two times a day (BID) | ORAL | 0 refills | Status: DC | PRN

## 2016-12-16 NOTE — ED Provider Notes (Addendum)
[place] [facility] DEPT Provider Note   CSN: [id number] Arrival date & time: 12/16/16  1247     History   Chief Complaint Chief Complaint  Patient presents with  . Neck Pain    HPI Andrew Hunt is a 76 y.o. male.  HPI  The patient is a 76 year old male who has a known history of high blood pressure, hyperlipidemia, transient ischemic attack and reports a prior history of an abdominal aortic aneurysm. He presents with right-sided neck pain going into his right shoulder and his right upper back which occurred approximately 5 days ago after he was on a long car ride, he fell asleep with his head bent to the left, when he woke up he was having pain in the right side of his neck. He states that it is gradually gotten worse and has become very stiff and with any range of motion of his head he has pain going into his back and underneath his arm on the right. He has no numbness or tingling going down his arm to his hand or fingers and has no pain in the arm itself. There is been no fevers chills rashes and no symptoms in his legs. He denies any chest pain and has no abdominal pain whatsoever. He has no medications for this, he has not seen his family doctor for this.  Past Medical History:  Diagnosis Date  . AAA (abdominal aortic aneurysm) ([place])   . Arthritis   . GERD (gastroesophageal reflux disease)   . Hyperlipidemia   . Hypertension   . Lung nodule   . TIA (transient ischemic attack)     Patient Active Problem List   Diagnosis Date Noted  . History of colonic polyps   . Encounter for screening colonoscopy 04/30/2014    Past Surgical History:  Procedure Laterality Date  . APPENDECTOMY    . BACK SURGERY    . CHOLECYSTECTOMY    . COLONOSCOPY N/A 05/05/2014   Procedure: COLONOSCOPY;  Surgeon: Daneil Dolin, MD;  Location: [facility];  Service: Endoscopy;  Laterality: N/A;  100  . MULTIPLE TOOTH EXTRACTIONS         Home Medications    Prior to Admission medications     Medication Sig Start Date End Date Taking? Authorizing Provider  amLODipine (NORVASC) 10 MG tablet Take 5 mg by mouth daily.     [provider]  aspirin EC 81 MG tablet Take 81 mg by mouth every other day.    [provider]  atorvastatin (LIPITOR) 40 MG tablet Take 40 mg by mouth daily.    [provider]  Glucosamine-Chondroit-Vit C-Mn (GLUCOSAMINE 1500 COMPLEX PO) Take 1,500 mg by mouth daily.    [provider]  nebivolol (BYSTOLIC) 10 MG tablet Take 10 mg by mouth daily.    [provider]  omeprazole (PRILOSEC) 20 MG capsule Take 20 mg by mouth daily.    [provider]  peg 3350 powder (MOVIPREP) 100 G SOLR Take 1 kit (200 g total) by mouth as directed. Patient not taking: Reported on 04/22/2014 06/25/13   Daneil Dolin, MD  tamsulosin (FLOMAX) 0.4 MG CAPS capsule Take 0.4 mg by mouth daily.    [provider]  valsartan (DIOVAN) 320 MG tablet Take 320 mg by mouth daily.    [provider]    Family History No family history on file.  Social History Social History  Substance Use Topics  . Smoking status: Current Every Day Smoker  Packs/day: 0.50    Years: 62.00    Types: Cigarettes  . Smokeless tobacco: Not on file  . Alcohol use No     Allergies   Mobic [meloxicam] and Vioxx [rofecoxib]   Review of Systems Review of Systems  All other systems reviewed and are negative.    Physical Exam Updated Vital Signs BP (!) 157/65 (BP Location: Right Arm)   Pulse (!) 57   Temp 97.8 F (36.6 C) (Oral)   Resp 18   Ht 5' 6"  (1.676 m)   Wt 74.8 kg (165 lb)   SpO2 97%   BMI 26.63 kg/m   Physical Exam  Constitutional: He appears well-developed and well-nourished. No distress.  HENT:  Head: Normocephalic and atraumatic.  Mouth/Throat: Oropharynx is clear and moist. No oropharyngeal exudate.  Eyes: Pupils are equal, round, and reactive to light. Conjunctivae and EOM are normal. Right eye exhibits  no discharge. Left eye exhibits no discharge. No scleral icterus.  Neck: No JVD present. No thyromegaly present.  Decreased range of motion secondary to stiffness of the neck. He has tenderness of the neck over the right paracervical and strap muscles going into the trapezius. There is also tenderness down into the rhomboid muscle.  Cardiovascular: Normal rate, regular rhythm, normal heart sounds and intact distal pulses.  Exam reveals no gallop and no friction rub.   No murmur heard. Pulmonary/Chest: Effort normal and breath sounds normal. No respiratory distress. He has no wheezes. He has no rales.  Abdominal: Soft. Bowel sounds are normal. He exhibits no distension and no mass. There is no tenderness.  Musculoskeletal: Normal range of motion. He exhibits no edema or tenderness.  Totally normal range of motion of the bilateral shoulders elbows wrists and hands. He has no pain with range of motion of the right shoulder or with use of the rotator cuff or deltoid muscles. Tenderness is reproducible upon palpation over the right trapezius and rhomboid muscles  tenderness over the left wrist over the radial surface, small lump, no redness, normal grip, normal range of motion of the fingers  Lymphadenopathy:    He has no cervical adenopathy.  Neurological: He is alert. Coordination normal.  Normal strength in all 4 extremities, bilateral upper extremities with normal grips and normal strength at the shoulders elbows and wrists.  Skin: Skin is warm and dry. No rash noted. No erythema.  No rash present  Psychiatric: He has a normal mood and affect. His behavior is normal.  Nursing note and vitals reviewed.    ED Treatments / Results  Labs (all labs ordered are listed, but only abnormal results are displayed) Labs Reviewed - No data to display   Radiology No results found.  Procedures Procedures (including critical care time)  Medications Ordered in ED Medications - No data to  display   Initial Impression / Assessment and Plan / ED Course  I have reviewed the triage vital signs and the nursing notes.  Pertinent labs & imaging results that were available during my care of the patient were reviewed by me and considered in my medical decision making (see chart for details).     The patient likely has muscle spasm and strain secondary to the event when he was in the car. At this point I would recommend anti-inflammatories, muscle relaxants and I will give him 2 Vicodin prior to discharge. I will recommend hot compressesand close follow-up with his family doctor on Monday. I discussed this with the patient and his spouse and  they are in agreement. This does not seem consistent with a coronary syndrome nor does it seem consistent with a abdominal aortic aneurysm.  Final Clinical Impressions(s) / ED Diagnoses   Final diagnoses:  Neck pain  Muscle strain of right upper back, initial encounter    New Prescriptions New Prescriptions   METHOCARBAMOL (ROBAXIN) 500 MG TABLET    Take 1 tablet (500 mg total) by mouth 2 (two) times daily as needed for muscle spasms.   NAPROXEN (NAPROSYN) 500 MG TABLET    Take 1 tablet (500 mg total) by mouth 2 (two) times daily with a meal.     Noemi Chapel, MD 12/16/16 1353    Noemi Chapel, MD 12/16/16 [phone]

## 2016-12-16 NOTE — ED Triage Notes (Addendum)
Pt reports neck pain for approx 4 days. Pt reports pain and difficulty turning  head from side to side and up and down. Pt reports pain is also going down left arm

## 2016-12-16 NOTE — Discharge Instructions (Signed)
Warm compresses Naprosyn twice daily Robaxin 2 or 3 times a day for msucle spasm ER for severe or worsening symptoms including chest pain, cough, difficultyi breathing, or numbness or weakness of your arm.

## 2018-04-07 ENCOUNTER — Emergency Department (HOSPITAL_COMMUNITY): Payer: Medicare Other

## 2018-04-07 ENCOUNTER — Encounter (HOSPITAL_COMMUNITY): Payer: Self-pay | Admitting: *Deleted

## 2018-04-07 ENCOUNTER — Inpatient Hospital Stay (HOSPITAL_COMMUNITY)
Admission: EM | Admit: 2018-04-07 | Discharge: 2018-04-09 | DRG: 176 | Disposition: A | Discharge status: Home / Self Care | Payer: Medicare Other | Attending: Family Medicine | Admitting: Family Medicine

## 2018-04-07 ENCOUNTER — Emergency Department (HOSPITAL_BASED_OUTPATIENT_CLINIC_OR_DEPARTMENT_OTHER): Payer: Medicare Other

## 2018-04-07 ENCOUNTER — Other Ambulatory Visit: Payer: Self-pay

## 2018-04-07 DIAGNOSIS — Z833 Family history of diabetes mellitus: Secondary | ICD-10-CM

## 2018-04-07 DIAGNOSIS — M1990 Unspecified osteoarthritis, unspecified site: Secondary | ICD-10-CM | POA: Diagnosis present

## 2018-04-07 DIAGNOSIS — G8929 Other chronic pain: Secondary | ICD-10-CM | POA: Diagnosis present

## 2018-04-07 DIAGNOSIS — Z801 Family history of malignant neoplasm of trachea, bronchus and lung: Secondary | ICD-10-CM | POA: Diagnosis not present

## 2018-04-07 DIAGNOSIS — Z7982 Long term (current) use of aspirin: Secondary | ICD-10-CM | POA: Diagnosis not present

## 2018-04-07 DIAGNOSIS — Z886 Allergy status to analgesic agent status: Secondary | ICD-10-CM | POA: Diagnosis not present

## 2018-04-07 DIAGNOSIS — Z87891 Personal history of nicotine dependence: Secondary | ICD-10-CM | POA: Diagnosis not present

## 2018-04-07 DIAGNOSIS — K219 Gastro-esophageal reflux disease without esophagitis: Secondary | ICD-10-CM | POA: Diagnosis not present

## 2018-04-07 DIAGNOSIS — I2699 Other pulmonary embolism without acute cor pulmonale: Secondary | ICD-10-CM | POA: Diagnosis not present

## 2018-04-07 DIAGNOSIS — M542 Cervicalgia: Secondary | ICD-10-CM

## 2018-04-07 DIAGNOSIS — E7800 Pure hypercholesterolemia, unspecified: Secondary | ICD-10-CM | POA: Diagnosis not present

## 2018-04-07 DIAGNOSIS — Z9049 Acquired absence of other specified parts of digestive tract: Secondary | ICD-10-CM

## 2018-04-07 DIAGNOSIS — R0602 Shortness of breath: Secondary | ICD-10-CM

## 2018-04-07 DIAGNOSIS — R911 Solitary pulmonary nodule: Secondary | ICD-10-CM | POA: Diagnosis present

## 2018-04-07 DIAGNOSIS — L03113 Cellulitis of right upper limb: Secondary | ICD-10-CM | POA: Diagnosis present

## 2018-04-07 DIAGNOSIS — R042 Hemoptysis: Secondary | ICD-10-CM | POA: Diagnosis present

## 2018-04-07 DIAGNOSIS — M47812 Spondylosis without myelopathy or radiculopathy, cervical region: Secondary | ICD-10-CM | POA: Diagnosis present

## 2018-04-07 DIAGNOSIS — Z841 Family history of disorders of kidney and ureter: Secondary | ICD-10-CM

## 2018-04-07 DIAGNOSIS — E785 Hyperlipidemia, unspecified: Secondary | ICD-10-CM | POA: Diagnosis present

## 2018-04-07 DIAGNOSIS — I2693 Single subsegmental pulmonary embolism without acute cor pulmonale: Secondary | ICD-10-CM | POA: Diagnosis not present

## 2018-04-07 DIAGNOSIS — I119 Hypertensive heart disease without heart failure: Secondary | ICD-10-CM | POA: Diagnosis present

## 2018-04-07 DIAGNOSIS — I10 Essential (primary) hypertension: Secondary | ICD-10-CM | POA: Diagnosis present

## 2018-04-07 DIAGNOSIS — I714 Abdominal aortic aneurysm, without rupture: Secondary | ICD-10-CM | POA: Diagnosis present

## 2018-04-07 DIAGNOSIS — Z8673 Personal history of transient ischemic attack (TIA), and cerebral infarction without residual deficits: Secondary | ICD-10-CM | POA: Diagnosis not present

## 2018-04-07 DIAGNOSIS — E872 Acidosis: Secondary | ICD-10-CM | POA: Diagnosis present

## 2018-04-07 DIAGNOSIS — G459 Transient cerebral ischemic attack, unspecified: Secondary | ICD-10-CM | POA: Diagnosis present

## 2018-04-07 DIAGNOSIS — M7989 Other specified soft tissue disorders: Secondary | ICD-10-CM

## 2018-04-07 LAB — COMPREHENSIVE METABOLIC PANEL
ALT: 21 U/L (ref 0–44)
ANION GAP: 11 (ref 5–15)
AST: 19 U/L (ref 15–41)
Albumin: 3.6 g/dL (ref 3.5–5.0)
Alkaline Phosphatase: 44 U/L (ref 38–126)
BUN: 14 mg/dL (ref 8–23)
CO2: 23 mmol/L (ref 22–32)
Calcium: 9.4 mg/dL (ref 8.9–10.3)
Chloride: 101 mmol/L (ref 98–111)
Creatinine, Ser: 0.94 mg/dL (ref 0.61–1.24)
GFR calc Af Amer: >60 mL/min (ref >60)
Glucose, Bld: 143 mg/dL — ABNORMAL HIGH (ref 70–99)
Potassium: 3.7 mmol/L (ref 3.5–5.1)
Sodium: 135 mmol/L (ref 135–145)
TOTAL PROTEIN: 7.7 g/dL (ref 6.5–8.1)
Total Bilirubin: 0.5 mg/dL (ref 0.3–1.2)

## 2018-04-07 LAB — CBC
HCT: 39.7 % (ref 39.0–52.0)
Hemoglobin: 13.3 g/dL (ref 13.0–17.0)
MCH: 30.9 pg (ref 26.0–34.0)
MCHC: 33.5 g/dL (ref 30.0–36.0)
MCV: 92.1 fL (ref 80.0–100.0)
PLATELETS: 238 10*3/uL (ref 150–400)
RBC: 4.31 MIL/uL (ref 4.22–5.81)
RDW: 12.4 % (ref 11.5–15.5)
WBC: 13.3 10*3/uL — ABNORMAL HIGH (ref 4.0–10.5)
nRBC: 0 % (ref 0.0–0.2)

## 2018-04-07 LAB — LACTIC ACID, PLASMA
Lactic Acid, Venous: 1.3 mmol/L (ref 0.5–1.9)
Lactic Acid, Venous: 1.1 mmol/L (ref 0.5–1.9)

## 2018-04-07 LAB — I-STAT CG4 LACTIC ACID, ED: Lactic Acid, Venous: 2.32 mmol/L (ref 0.5–1.9)

## 2018-04-07 LAB — BRAIN NATRIURETIC PEPTIDE: B Natriuretic Peptide: 113.1 pg/mL — ABNORMAL HIGH (ref 0.0–100.0)

## 2018-04-07 LAB — HEPARIN LEVEL (UNFRACTIONATED): Heparin Unfractionated: 0.29 IU/mL — ABNORMAL LOW (ref 0.30–0.70)

## 2018-04-07 MED ORDER — GLUCOSAMINE 1500 COMPLEX PO CAPS: 1.0000 | ORAL_CAPSULE | Freq: Every day | ORAL | Status: DC

## 2018-04-07 MED ORDER — PANTOPRAZOLE SODIUM 40 MG PO TBEC
40.0000 mg | DELAYED_RELEASE_TABLET | Freq: Every day | ORAL | Status: DC
  Administered 2018-04-08 – 2018-04-09 (×2): 40 mg via ORAL
  Filled 2018-04-07 (×2): qty 1

## 2018-04-07 MED ORDER — ACETAMINOPHEN 325 MG PO TABS: 650.0000 mg | ORAL_TABLET | Freq: Four times a day (QID) | ORAL | Status: DC | PRN

## 2018-04-07 MED ORDER — AMLODIPINE BESYLATE 5 MG PO TABS
5.0000 mg | ORAL_TABLET | Freq: Every day | ORAL | Status: DC
  Administered 2018-04-08 – 2018-04-09 (×2): 5 mg via ORAL
  Filled 2018-04-07 (×2): qty 1

## 2018-04-07 MED ORDER — LIDOCAINE 5 % EX PTCH
1.0000 | MEDICATED_PATCH | CUTANEOUS | Status: DC
  Administered 2018-04-07 – 2018-04-09 (×3): 1 via TRANSDERMAL
  Filled 2018-04-07 (×4): qty 1

## 2018-04-07 MED ORDER — VARENICLINE TARTRATE 0.5 MG PO TABS
0.5000 mg | ORAL_TABLET | Freq: Two times a day (BID) | ORAL | Status: DC
  Administered 2018-04-07 – 2018-04-09 (×4): 0.5 mg via ORAL
  Filled 2018-04-07 (×5): qty 1

## 2018-04-07 MED ORDER — METHOCARBAMOL 750 MG PO TABS
750.0000 mg | ORAL_TABLET | Freq: Three times a day (TID) | ORAL | Status: DC
  Administered 2018-04-07 – 2018-04-08 (×2): 750 mg via ORAL
  Filled 2018-04-07 (×2): qty 1

## 2018-04-07 MED ORDER — NEBIVOLOL HCL 10 MG PO TABS
10.0000 mg | ORAL_TABLET | Freq: Every day | ORAL | Status: DC
  Administered 2018-04-08 – 2018-04-09 (×2): 10 mg via ORAL
  Filled 2018-04-07 (×2): qty 1

## 2018-04-07 MED ORDER — HYDROMORPHONE HCL 1 MG/ML IJ SOLN
0.5000 mg | Freq: Once | INTRAMUSCULAR | Status: AC
  Administered 2018-04-07: 0.5 mg via INTRAVENOUS
  Filled 2018-04-07: qty 1

## 2018-04-07 MED ORDER — SODIUM CHLORIDE 0.9 % IV BOLUS
500.0000 mL | Freq: Once | INTRAVENOUS | Status: AC
  Administered 2018-04-07: 500 mL via INTRAVENOUS

## 2018-04-07 MED ORDER — IBUPROFEN 400 MG PO TABS
400.0000 mg | ORAL_TABLET | Freq: Four times a day (QID) | ORAL | Status: DC | PRN
  Administered 2018-04-07 – 2018-04-08 (×2): 400 mg via ORAL
  Filled 2018-04-07 (×2): qty 1

## 2018-04-07 MED ORDER — SODIUM CHLORIDE 0.9% FLUSH
3.0000 mL | Freq: Two times a day (BID) | INTRAVENOUS | Status: DC
  Administered 2018-04-07 – 2018-04-08 (×2): 3 mL via INTRAVENOUS

## 2018-04-07 MED ORDER — ACETAMINOPHEN 650 MG RE SUPP: 650.0000 mg | Freq: Four times a day (QID) | RECTAL | Status: DC | PRN

## 2018-04-07 MED ORDER — HEPARIN (PORCINE) 25000 UT/250ML-% IV SOLN
1500.0000 [IU]/h | INTRAVENOUS | Status: DC
  Administered 2018-04-07: 1400 [IU]/h via INTRAVENOUS
  Administered 2018-04-08 – 2018-04-09 (×2): 1500 [IU]/h via INTRAVENOUS
  Filled 2018-04-07 (×3): qty 250

## 2018-04-07 MED ORDER — IOPAMIDOL (ISOVUE-370) INJECTION 76%
75.0000 mL | Freq: Once | INTRAVENOUS | Status: AC | PRN
  Administered 2018-04-07: 75 mL via INTRAVENOUS

## 2018-04-07 MED ORDER — HYDROMORPHONE HCL 1 MG/ML IJ SOLN
1.0000 mg | INTRAMUSCULAR | Status: DC | PRN
  Administered 2018-04-07 – 2018-04-08 (×5): 1 mg via INTRAVENOUS
  Filled 2018-04-07 (×5): qty 1

## 2018-04-07 MED ORDER — TAMSULOSIN HCL 0.4 MG PO CAPS
0.4000 mg | ORAL_CAPSULE | Freq: Every day | ORAL | Status: DC
  Administered 2018-04-07 – 2018-04-08 (×2): 0.4 mg via ORAL
  Filled 2018-04-07 (×2): qty 1

## 2018-04-07 MED ORDER — HEPARIN BOLUS VIA INFUSION
5000.0000 [IU] | Freq: Once | INTRAVENOUS | Status: AC
  Administered 2018-04-07: 5000 [IU] via INTRAVENOUS
  Filled 2018-04-07: qty 5000

## 2018-04-07 MED ORDER — ASPIRIN EC 81 MG PO TBEC
81.0000 mg | DELAYED_RELEASE_TABLET | Freq: Every day | ORAL | Status: DC
  Administered 2018-04-08 – 2018-04-09 (×2): 81 mg via ORAL
  Filled 2018-04-07 (×2): qty 1

## 2018-04-07 MED ORDER — IOPAMIDOL (ISOVUE-370) INJECTION 76%
INTRAVENOUS | Status: AC
  Filled 2018-04-07: qty 100

## 2018-04-07 MED ORDER — ATORVASTATIN CALCIUM 40 MG PO TABS
40.0000 mg | ORAL_TABLET | Freq: Every day | ORAL | Status: DC
  Administered 2018-04-08 – 2018-04-09 (×2): 40 mg via ORAL
  Filled 2018-04-07 (×2): qty 1

## 2018-04-07 MED ORDER — DOXYCYCLINE HYCLATE 100 MG PO TABS
100.0000 mg | ORAL_TABLET | Freq: Two times a day (BID) | ORAL | Status: DC
  Administered 2018-04-07 – 2018-04-09 (×4): 100 mg via ORAL
  Filled 2018-04-07 (×4): qty 1

## 2018-04-07 MED ORDER — METHOCARBAMOL 500 MG PO TABS
1000.0000 mg | ORAL_TABLET | Freq: Once | ORAL | Status: AC
  Administered 2018-04-07: 1000 mg via ORAL
  Filled 2018-04-07: qty 2

## 2018-04-07 NOTE — Progress Notes (Signed)
ANTICOAGULATION CONSULT NOTE - Initial Consult  Pharmacy Consult for IV Heparin Indication: pulmonary embolus  Allergies  Allergen Reactions  . Vioxx [Rofecoxib] Swelling and Other (See Comments)    Caused mini-strokes    Patient Measurements: Height: 5\' 6"  (167.6 cm) Weight: 175 lb (79.4 kg) IBW/kg (Calculated) : 63.8 Heparin Dosing Weight: 79.4 kg  Vital Signs: Temp: 98.5 F (36.9 C) (12/29 1106) Temp Source: Oral (12/29 1106) BP: 136/58 (12/29 1138) Pulse Rate: 64 (12/29 1138)  Labs: Recent Labs    04/07/18 1136  HGB 13.3  HCT 39.7  PLT 238  CREATININE 0.94    Estimated Creatinine Clearance: 65.2 mL/min (by C-G formula based on SCr of 0.94 mg/dL).   Medical History: Past Medical History:  Diagnosis Date  . AAA (abdominal aortic aneurysm) ([place])   . Arthritis   . GERD (gastroesophageal reflux disease)   . Hyperlipidemia   . Hypertension   . Lung nodule   . TIA (transient ischemic attack)    Assessment: 77 year old male with confirmed PE on CT Angio (RV:LV 0.69) to start IV Heparin per pharmacy consult.   CBC is within normal limits. SCr 0.94.  No bleeding noted.  No anticoagulation prior to admission.   Goal of Therapy:  Heparin level 0.3-0.7 units/ml Monitor platelets by anticoagulation protocol: Yes   Plan:  Heparin bolus 5000 units x1 Heparin rate of 1400 units/hr.  Heparin level in 6-8 hours Daily Heparin level and CBC  Sloan Leiter, PharmD, BCPS, BCCCP Clinical Pharmacist Please refer to [facility] for [place] numbers 04/07/2018,2:49 PM

## 2018-04-07 NOTE — Progress Notes (Signed)
[place] for IV Heparin Indication: pulmonary embolus  Allergies  Allergen Reactions  . Vioxx [Rofecoxib] Swelling and Other (See Comments)    Caused mini-strokes    Patient Measurements: Height: 5\' 6"  (167.6 cm) Weight: 175 lb (79.4 kg) IBW/kg (Calculated) : 63.8 Heparin Dosing Weight: 79.4 kg  Vital Signs: Temp: 98.2 F (36.8 C) (12/29 1800) Temp Source: Oral (12/29 1800) BP: 144/82 (12/29 1800) Pulse Rate: 66 (12/29 1800)  Labs: Recent Labs    04/07/18 1136 04/07/18 2032  HGB 13.3  --   HCT 39.7  --   PLT 238  --   HEPARINUNFRC  --  0.29*  CREATININE 0.94  --     Estimated Creatinine Clearance: 65.2 mL/min (by C-G formula based on SCr of 0.94 mg/dL).   Medical History: Past Medical History:  Diagnosis Date  . AAA (abdominal aortic aneurysm) ([place])   . Arthritis   . GERD (gastroesophageal reflux disease)   . Hyperlipidemia   . Hypertension   . Lung nodule   . TIA (transient ischemic attack)    Assessment: 77 year old male with confirmed PE on CT Angio (RV:LV 0.69) to start IV Heparin per pharmacy consult.   Heparin level is slightly subtherapeutic at 0.29, on 1400 units/hr. Hgb 13.3, plt 238. No s/sx of bleeding. No infusion issues.   Goal of Therapy:  Heparin level 0.3-0.7 units/ml Monitor platelets by anticoagulation protocol: Yes   Plan:  Increase heparin infusion to 1500 units/hr Monitor heparin level in 6 hours Daily Heparin level and CBC  Antonietta Jewel, PharmD, BCCCP Clinical Pharmacist  Pager: [phone] Phone: [phone] Please refer to [facility] for [place] numbers 04/07/2018,10:08 PM

## 2018-04-07 NOTE — H&P (Addendum)
History and Physical    ERLE GUSTER EHM:[id number] DOB: 07/29/1940 DOA: 04/07/2018  PCP: Dione Housekeeper, MD  Patient coming from: Home  Chief Complaint: SOB, neck pain  HPI: Andrew Hunt is a 77 y.o. male with medical history significant of H/O TIA, HTN, HLD, GERD, AAA (7-8 years, gets yearly ultrasound), arthritis who presents for SOB and hemoptysis.  He reports noticing the hemoptysis about 2 weeks ago, streaky and with morning coughing.  He didn't think much of it.  He had also developed worsening SOB for about 2-3 weeks.  About 1 week ago, on Christmas day, he noticed right hand and arm swelling with decreased ability to move that arm.  Family states that he has had decreased movement of that arm as well.  He went to his primary doctor on 12/26 and was diagnosed with cellulitis and the swelling and erythema have slowly gotten better with doxycycline.  The main reason he came in to day was neck stiffness.  He has chronic neck stiffness and inability to turn the head a full 90 degrees, however, he slept on the couch 2 nights ago and awoke with neck pain and stiffness and then this morning was unable to turn his head to the right.  He has no fever, chills, nausea, vomiting, diarrhea, AMS, confusion, eye pain or difficulty moving his eyes.  He does have intermittent leg swelling but none today.  He has no recent travel or surgery.  He has no diagnosis of cancer that he is aware of (being monitored for pulmonary nodules).  He has no history of personal clotting or family history of clotting.   ED Course: In the ED, he was given robaxin and dilaudid for his neck pain and these helped some but have warn off.  Given his h/o hemoptysis and arm swelling, a CT chest was done and showed a new PE, without right heart strain.  CT spine was also done which showed some cervical spondylosis, but no acute issues.  He further had an elevated WBC to 13, an elevated lactate to 2.3.  He has not had any  tachycardia or hypoxia.  Heparin was started.   Review of Systems: As per HPI otherwise 10 point review of systems negative.   Past Medical History:  Diagnosis Date  . AAA (abdominal aortic aneurysm) ([place])   . Arthritis   . GERD (gastroesophageal reflux disease)   . Hyperlipidemia   . Hypertension   . Lung nodule   . TIA (transient ischemic attack)     Past Surgical History:  Procedure Laterality Date  . APPENDECTOMY    . BACK SURGERY    . CHOLECYSTECTOMY    . COLONOSCOPY N/A 05/05/2014   Procedure: COLONOSCOPY;  Surgeon: Daneil Dolin, MD;  Location[facility];  Service: Endoscopy;  Laterality: N/A;  100  . MULTIPLE TOOTH EXTRACTIONS     Reviewed with patient.   reports that he has quit smoking. His smoking use included cigarettes. He has a 31.00 pack-year smoking history. He has never used smokeless tobacco. He reports that he does not drink alcohol or use drugs.  Allergies  Allergen Reactions  . Vioxx [Rofecoxib] Swelling and Other (See Comments)    Caused mini-strokes   Reviewed with patient Family History  Problem Relation Age of Onset  . Diabetes Mellitus II Mother   . Kidney disease Mother   . Lung cancer Sister     Prior to Admission medications   Medication Sig Start Date End  Date Taking? Authorizing Provider  amLODipine (NORVASC) 10 MG tablet Take 5 mg by mouth daily.    Yes [provider]  aspirin EC 81 MG tablet Take 81 mg by mouth daily.    Yes [provider]  atorvastatin (LIPITOR) 40 MG tablet Take 40 mg by mouth daily.   Yes [provider]  doxycycline (VIBRAMYCIN) 100 MG capsule Take 100 mg by mouth 2 (two) times daily. 21 doses remaining 04/04/18 04/18/18 Yes [provider]  Glucosamine-Chondroit-Vit C-Mn (GLUCOSAMINE 1500 COMPLEX PO) Take 1,500 mg by mouth daily.   Yes [provider]  ibuprofen (ADVIL,MOTRIN) 200 MG tablet Take 600 mg by mouth every 6 (six) hours as needed for moderate pain.   Yes  [provider]  nebivolol (BYSTOLIC) 10 MG tablet Take 10 mg by mouth daily.   Yes [provider]  omeprazole (PRILOSEC) 20 MG capsule Take 20 mg by mouth daily.   Yes [provider]  tamsulosin (FLOMAX) 0.4 MG CAPS capsule Take 0.4 mg by mouth at bedtime.    Yes [provider]  varenicline (CHANTIX) 0.5 MG tablet Take 0.5 mg by mouth 2 (two) times daily.   Yes [provider]  methocarbamol (ROBAXIN) 500 MG tablet Take 1 tablet (500 mg total) by mouth 2 (two) times daily as needed for muscle spasms. Patient not taking: Reported on 04/07/2018 12/16/16   Noemi Chapel, MD  naproxen (NAPROSYN) 500 MG tablet Take 1 tablet (500 mg total) by mouth 2 (two) times daily with a meal. Patient not taking: Reported on 04/07/2018 12/16/16   Noemi Chapel, MD  peg 3350 powder (MOVIPREP) 100 G SOLR Take 1 kit (200 g total) by mouth as directed. Patient not taking: Reported on 04/22/2014 06/25/13   Daneil Dolin, MD    Physical Exam:  Constitutional: lying in bed, some distress, cannot move head due to neck strain Vitals:   04/07/18 1106 04/07/18 1110 04/07/18 1138  BP:   (!) 136/58  Pulse:   64  Resp:   18  Temp: 98.5 F (36.9 C)    TempSrc: Oral    SpO2:   97%  Weight:  79.4 kg   Height:  5' 6"  (1.676 m)    Eyes: lids normal and conjunctivae injected ENMT: Mucous membranes are moist. Multiple missing teeth Neck: He has muscle spasm of right trapezius, greater than left, he has no LAD. He can move his head toward his chest, but not completely.  He cannot look to the right.  TTP over trapezius and scalp.  Respiratory: CTAB, no wheezing noted Cardiovascular: RR, NR, no murmur.  Pulses are 2+ bilateral radial arteries and DP arteries.  He has some non pitting residual swelling in his right lower arm, hand Abdomen: + distended but soft, +BS, NT Musculoskeletal: no clubbing / cyanosis. Normal muscle tone.  Skin: He has thin skin, multiple areas of bruising  on arms, chronic changes to nails Neurologic: Grossly intact, able to move all extremities and sit up in bed easily, EOMI Psychiatric: Normal judgment and insight. Alert and oriented x 3. Normal mood.   Labs on Admission: I have personally reviewed following labs and imaging studies  CBC: Recent Labs  Lab 04/07/18 1136  WBC 13.3*  HGB 13.3  HCT 39.7  MCV 92.1  PLT 350   Basic Metabolic Panel: Recent Labs  Lab 04/07/18 1136  NA 135  K 3.7  CL 101  CO2 23  GLUCOSE 143*  BUN 14  CREATININE  0.94  CALCIUM 9.4   GFR: Estimated Creatinine Clearance: 65.2 mL/min (by C-G formula based on SCr of 0.94 mg/dL). Liver Function Tests: Recent Labs  Lab 04/07/18 1136  AST 19  ALT 21  ALKPHOS 44  BILITOT 0.5  PROT 7.7  ALBUMIN 3.6   No results for input(s): LIPASE, AMYLASE in the last 168 hours. No results for input(s): AMMONIA in the last 168 hours. Coagulation Profile: No results for input(s): INR, PROTIME in the last 168 hours. Cardiac Enzymes: No results for input(s): CKTOTAL, CKMB, CKMBINDEX, TROPONINI in the last 168 hours. BNP (last 3 results) No results for input(s): PROBNP in the last 8760 hours. HbA1C: No results for input(s): HGBA1C in the last 72 hours. CBG: No results for input(s): GLUCAP in the last 168 hours. Lipid Profile: No results for input(s): CHOL, HDL, LDLCALC, TRIG, CHOLHDL, LDLDIRECT in the last 72 hours. Thyroid Function Tests: No results for input(s): TSH, T4TOTAL, FREET4, T3FREE, THYROIDAB in the last 72 hours. Anemia Panel: No results for input(s): VITAMINB12, FOLATE, FERRITIN, TIBC, IRON, RETICCTPCT in the last 72 hours. Urine analysis: No results found for: COLORURINE, APPEARANCEUR, [place], [place], GLUCOSEU, HGBUR, BILIRUBINUR, KETONESUR, PROTEINUR, UROBILINOGEN, NITRITE, LEUKOCYTESUR  Radiological Exams on Admission: Ct Angio Chest Pe W Or Wo Contrast  Result Date: 04/07/2018 CLINICAL DATA:  Dyspnea and hemoptysis with right arm  swelling and neck pain. EXAM: CT ANGIOGRAPHY CHEST WITH CONTRAST TECHNIQUE: Multidetector CT imaging of the chest was performed using the standard protocol during bolus administration of intravenous contrast. Multiplanar CT image reconstructions and MIPs were obtained to evaluate the vascular anatomy. CONTRAST:  32m ISOVUE-370 IOPAMIDOL (ISOVUE-370) INJECTION 76% COMPARISON:  12/10/2014 FINDINGS: Cardiovascular: Nonocclusive bilateral lower lobe segmental and subsegmental pulmonary emboli. No heart strain with RV/LV ratio approximately 0.69, within normal. Cardiomegaly is noted without pericardial effusion. Left main and three-vessel coronary arteriosclerosis is identified. Aortic atherosclerosis without aneurysm or dissection. Mediastinum/Nodes: Mildly enlarged mediastinal nodes to 13 mm. Small bilateral hilar lymph nodes without pathologic enlargement. Trachea is patent as are the mainstem bronchi. The CT appearance of the esophagus is unremarkable. No thyromegaly or mass. Lungs/Pleura: 5 mm subpleural right upper lobe pulmonary nodule noted posteriorly, series 6/32. Similar sized calcified nodule in the superior segment of right lower lobe, series 6/44. Two adjacent nodular densities along the minor fissure may reflect fissural nodes these measure up to 7 mm, series 6/70. Centrilobular and paraseptal emphysema is seen bilaterally. Upper Abdomen: Small hiatal hernia. No acute abnormality. Cholecystectomy. Musculoskeletal: No chest wall abnormality. No acute or significant osseous findings. Review of the MIP images confirms the above findings. IMPRESSION: 1. Positive for acute PE without CT evidence of right heart strain (RV/LV Ratio = 0.69). Centrilobular and paraseptal emphysema. These results were called by telephone at the time of interpretation on 04/07/2018 at 2:29 pm to Dr. MGerlene Fee, who verbally acknowledged these results. 2. Stable right upper, superior segment right lower and right middle lobe  pulmonary nodules measuring up to 7 mm since 2016 comparison. Findings may be secondary to granulomatous disease. Aortic Atherosclerosis (ICD10-I70.0) and Emphysema (ICD10-J43.9). Emphysema (ICD10-J43.9). Electronically Signed   By: DAshley RoyaltyM.D.   On: 04/07/2018 14:29   Ct Cervical Spine Wo Contrast  Result Date: 04/07/2018 CLINICAL DATA:  Right-sided neck pain radiating to the shoulder and upper back starting 5 days ago after long car ride. EXAM: CT CERVICAL SPINE WITHOUT CONTRAST TECHNIQUE: Multidetector CT imaging of the cervical spine was performed without intravenous contrast. Multiplanar CT image reconstructions were also  generated. COMPARISON:  None. FINDINGS: Alignment: Maintained cervical lordosis with slight grade 1 retrolisthesis of C5 on C6 by 2 mm likely on the basis of degenerative disc and facet arthropathy. Intact atlantodental interval. Intact craniocervical relationship. No splaying of the lateral masses of C1 on C2. Skull base and vertebrae: Intact skull base. No acute cervical spine fracture or suspicious osseous lesions. Soft tissues and spinal canal: No prevertebral fluid or swelling. No visible canal hematoma. Disc levels: No significant central canal stenosis. Moderate disc flattening at C5-6 and C6-7. Multilevel degenerative facet arthropathy most prominent on the right at C3-4 and C4-5. Mild uncovertebral joint osteoarthritis on the right at C3-4, bilaterally at C4-5, C5-6 and C6-7. Slight foraminal encroachment from uncinate spurring is identified on the right at C4-5, bilaterally at C5-6 and C6-7. Upper chest: Negative. Other: None. 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 Electronically Signed   By: Ashley Royalty M.D.   On: 04/07/2018 14:12   Dg Chest Port 1 View  Result Date: 04/07/2018 CLINICAL DATA:  Shortness of breath. Neck pain that goes down left arm. EXAM: PORTABLE CHEST 1 VIEW COMPARISON:  Chest CT 12/10/2014  FINDINGS: Two AP views of the chest were obtained. Lung bases are incompletely evaluated on both images. Few densities at the left lung base are nonspecific and could be related to atelectasis. Heart size appears to be mildly enlarged. Atherosclerotic calcifications at the aortic arch. IMPRESSION: Incomplete evaluation of the lung bases. Patchy densities at the left lung base are nonspecific. Findings could be related to atelectasis but cannot exclude consolidation or airspace disease. Recommend repeat imaging with PA and lateral chest views. Electronically Signed   By: Markus Daft M.D.   On: 04/07/2018 12:32    EKG: Independently reviewed. Flipped Twaves in lead III, sinus rhythm  Assessment/Plan Pulmonary embolus  - Started on heparin by ED, consider transition to [place] in 24 - 48 hours -  Monitor on telemetry - Unclear cause of clot, possibly from swelling in arm.  He has no recent stasis or injury.  No surgery.  No travel.  I am concerned for a possible malignancy.  He has no change in bowel habits.  He recently quit smoking with chantix. CT scan showed stable nodules.  UA for possible urinary blood - ? Bladder malignancy given smoking history.  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   Neck spasm, MSK pain - Robaxin TID for 3 days - PRN dilaudid - Monitor for worsening or changes.   Right arm cellulitis - Continue doxycycline to complete course.   Lactic acidosis, leukocytosis - Possibly related to recent infection - Bolus 500cc in the ED - Trend lactic acid.     Lung nodule - Stable on imaging, no need for further work up inpatient    Hypertension - Continue home amlodipine, nebivolol    Hyperlipidemia - Contniue home atorvastatin    GERD (gastroesophageal reflux disease) - Continue home omeprazole  H/O TIA (transient ischemic attack) - Continue aspirin     DVT prophylaxis: Heparin ggt  Code Status: Full  Family Communication: Wife and daughter at bedside  Disposition  Plan: Admit for heparin ggt  Consults called: None  Admission status: INP medical telemetry    Gilles Chiquito MD [facility] Pager [phone]  If 7PM-7AM, please contact night-coverage [email or web address] Password [facility]  04/07/2018, 3:38 PM

## 2018-04-07 NOTE — Progress Notes (Signed)
Andrew Hunt [id number] Admission Data: 04/07/2018 6:42 PM Attending Provider: Sid Falcon, MD  GYF:VCBSWH, Hollice Espy, MD Consults/ Treatment Team:   Andrew Hunt is a 77 y.o. male patient admitted from ED awake, alert  & orientated  X 3,  Full Code, VSS - Blood pressure (!) 144/82, pulse 66, temperature 98.2 F (36.8 C), temperature source Oral, resp. rate 18, height 5\' 6"  (1.676 m), weight 79.4 kg, SpO2 98 %. no c/o shortness of breath, no c/o chest pain, no distress noted. Tele # 19 placed and pt is currently running:normal sinus rhythm.   IV site WDL:  antecubital left, condition patent and no redness with a transparent dsg that's clean dry and intact.  Allergies:   Allergies  Allergen Reactions  . Vioxx [Rofecoxib] Swelling and Other (See Comments)    Caused mini-strokes     Past Medical History:  Diagnosis Date  . AAA (abdominal aortic aneurysm) ([place])   . Arthritis   . GERD (gastroesophageal reflux disease)   . Hyperlipidemia   . Hypertension   . Lung nodule   . TIA (transient ischemic attack)     History:  obtained from chart review. Tobacco/alcohol: denied none  Pt orientation to unit, room and routine. Information packet given to patient/family and safety video watched.  Admission INP armband ID verified with patient/family, and in place. SR up x 2, fall risk assessment complete with Patient and family verbalizing understanding of risks associated with falls. Pt verbalizes an understanding of how to use the call bell and to call for help before getting out of bed.  Skin, clean-dry- intact without evidence of bruising, or skin tears.   No evidence of skin break down noted on exam. no rashes, no ecchymoses, no petechiae, no nodules, no jaundice, no purpura, no wounds    Will cont to monitor and assist as needed.  Salley Slaughter, RN 04/07/2018 6:42 PM

## 2018-04-07 NOTE — Progress Notes (Signed)
VASCULAR LAB PRELIMINARY  PRELIMINARY  PRELIMINARY  PRELIMINARY  Right upper extremity venous duplex completed.    Preliminary report:  There is no DVT or SVT noted in the right upper extremity.  Gave results to Dr. Leontine Locket, [facility], RVT 04/07/2018, 2:48 PM

## 2018-04-07 NOTE — ED Provider Notes (Signed)
[facility] Emergency Department Provider Note MRN:  [id number]  Arrival date & time: 04/07/18     Chief Complaint   Arm Injury   History of Present Illness   Andrew Hunt is a 77 y.o. year-old male with a history of hypertension, hyperlipidemia, TIA, AAA presenting to the ED with chief complaint of arm swelling.  Patient explains that 9 days ago, he was cutting cabbage.  4 days after that, began having right arm swelling and redness.  Was diagnosed with cellulitis, has been taking p.o. doxycycline at home for the past 3 days.  The redness has greatly improved, but the arm is still very swollen.  Patient is also endorsing gradual onset shortness of breath for the past several days, denies chest pain.  Also endorsing gradual onset neck pain for the past several days, patient has chronic neck pain but it is much worse today.  Denies falls or trauma to the neck or arm.  Denies fever, no cold-like symptoms.  Yesterday and today endorsing cough productive of bloody sputum.  Denies abdominal pain, no lightheadedness or fainting spells, no bowel or bladder dysfunction, no numbness or weakness to the arms or legs.  Review of Systems  A complete 10 system review of systems was obtained and all systems are negative except as noted in the HPI and PMH.   Patient's Health History    Past Medical History:  Diagnosis Date  . AAA (abdominal aortic aneurysm) ([place])   . Arthritis   . GERD (gastroesophageal reflux disease)   . Hyperlipidemia   . Hypertension   . Lung nodule   . TIA (transient ischemic attack)     Past Surgical History:  Procedure Laterality Date  . APPENDECTOMY    . BACK SURGERY    . CHOLECYSTECTOMY    . COLONOSCOPY N/A 05/05/2014   Procedure: COLONOSCOPY;  Surgeon: Daneil Dolin, MD;  Location: [facility];  Service: Endoscopy;  Laterality: N/A;  100  . MULTIPLE TOOTH EXTRACTIONS      History reviewed. No pertinent family history.  Social History    Socioeconomic History  . Marital status: Married    Spouse name: Not on file  . Number of children: Not on file  . Years of education: Not on file  . Highest education level: Not on file  Occupational History  . Not on file  Social Needs  . Financial resource strain: Not on file  . Food insecurity:    Worry: Not on file    Inability: Not on file  . Transportation needs:    Medical: Not on file    Non-medical: Not on file  Tobacco Use  . Smoking status: Current Every Day Smoker    Packs/day: 0.50    Years: 62.00    Pack years: 31.00    Types: Cigarettes  . Smokeless tobacco: Never Used  Substance and Sexual Activity  . Alcohol use: No    Alcohol/week: 0.0 standard drinks  . Drug use: No  . Sexual activity: Not on file  Lifestyle  . Physical activity:    Days per week: Not on file    Minutes per session: Not on file  . Stress: Not on file  Relationships  . Social connections:    Talks on phone: Not on file    Gets together: Not on file    Attends religious service: Not on file    Active member of club or organization: Not on file    Attends  meetings of clubs or organizations: Not on file    Relationship status: Not on file  . Intimate partner violence:    Fear of current or ex partner: Not on file    Emotionally abused: Not on file    Physically abused: Not on file    Forced sexual activity: Not on file  Other Topics Concern  . Not on file  Social History Narrative  . Not on file     Physical Exam  Vital Signs and Nursing Notes reviewed Vitals:   04/07/18 1106 04/07/18 1138  BP:  (!) 136/58  Pulse:  64  Resp:  18  Temp: 98.5 F (36.9 C)   SpO2:  97%    CONSTITUTIONAL: Well-appearing, NAD NEURO:  Alert and oriented x 3, no focal deficits EYES:  eyes equal and reactive ENT/NECK:  no LAD, no JVD CARDIO: Regular rate, well-perfused, normal S1 and S2 PULM:  CTAB no wheezing or rhonchi GI/GU:  normal bowel sounds, non-distended, non-tender MSK/SPINE:   No gross deformities, no edema; tenderness palpation to the midline cervical spine with reduced range of motion due to pain SKIN:  no rash, atraumatic; 2+ pitting edema to the right upper extremity PSYCH:  Appropriate speech and behavior  Diagnostic and Interventional Summary    EKG Interpretation  Date/Time:  Sunday April 07 2018 11:42:11 EST Ventricular Rate:  63 PR Interval:    QRS Duration: 98 QT Interval:  407 QTC Calculation: 417 R Axis:   18 Text Interpretation:  Sinus rhythm Borderline repol abnormality, diffuse leads Baseline wander in lead(s) V4 V5 V6 Confirmed by Gerlene Fee [phone]) on 04/07/2018 12:14:27 PM      Labs Reviewed  CBC - Abnormal; Notable for the following components:      Result Value   WBC 13.3 (*)    All other components within normal limits  COMPREHENSIVE METABOLIC PANEL - Abnormal; Notable for the following components:   Glucose, Bld 143 (*)    All other components within normal limits  BRAIN NATRIURETIC PEPTIDE - Abnormal; Notable for the following components:   B Natriuretic Peptide 113.1 (*)    All other components within normal limits  I-STAT CG4 LACTIC ACID, ED - Abnormal; Notable for the following components:   Lactic Acid, Venous 2.32 (*)    All other components within normal limits  I-STAT TROPONIN, ED    CT ANGIO CHEST PE W OR WO CONTRAST  Final Result    CT CERVICAL SPINE WO CONTRAST  Final Result    DG Chest Port 1 View  Final Result    UE Venous Duplex ([facility] and [facility] ONLY)    (Results Pending)    Medications  sodium chloride 0.9 % bolus 500 mL (has no administration in time range)  lidocaine (LIDODERM) 5 % 1 patch (1 patch Transdermal Patch Applied 04/07/18 1411)  iopamidol (ISOVUE-370) 76 % injection (has no administration in time range)  HYDROmorphone (DILAUDID) injection 0.5 mg (0.5 mg Intravenous Given 04/07/18 1131)  methocarbamol (ROBAXIN) tablet 1,000 mg (1,000 mg Oral Given 04/07/18 1411)  iopamidol (ISOVUE-370) 76  % injection 75 mL (75 mLs Intravenous Contrast Given 04/07/18 1338)     Procedures Critical Care Critical Care Documentation Critical care time provided by me (excluding procedures): 37 minutes  Condition necessitating critical care: Acute pulmonary embolism  Components of critical care management: reviewing of prior records, laboratory and imaging interpretation, frequent re-examination and reassessment of vital signs, administration of IV heparin, discussion with consulting services.    ED  Course and Medical Decision Making  I have reviewed the triage vital signs and the nursing notes.  Pertinent labs & imaging results that were available during my care of the patient were reviewed by me and considered in my medical decision making (see below for details).  Differential diagnosis includes pulmonary embolism given the hemoptysis and shortness of breath, upper extremity DVT given the swelling.  Swelling related to cellulitis still a possibility, seems to be improving with antibiotics.  Also considering SVC syndrome related to intrathoracic malignancy.  Patient is afebrile, nontoxic, does not want to move his neck but seems to be musculoskeletal in nature.  Very low concern for meningitis.  Work-up pending.  CTs reveal acute pulmonary embolism, DVT study is negative for clot in the right arm.  Admitted to hospital service for further care and evaluation.  Patient states his neck pain is mildly improved after interventions listed above.  Barth Kirks. [place] Small, [place] [email or web address]  Final Clinical Impressions(s) / ED Diagnoses     ICD-10-CM   1. Arm swelling M79.89   2. SOB (shortness of breath) R06.02 DG Chest [facility] 1 View    DG Chest [place] 1 View  3. Other acute pulmonary embolism without acute cor pulmonale (HCC) I26.99   4. Neck pain M54.2     ED Discharge Orders    None         Maudie Flakes, MD 04/07/18 1447

## 2018-04-08 ENCOUNTER — Inpatient Hospital Stay (HOSPITAL_COMMUNITY): Payer: Medicare Other

## 2018-04-08 DIAGNOSIS — I2693 Single subsegmental pulmonary embolism without acute cor pulmonale: Secondary | ICD-10-CM

## 2018-04-08 LAB — BASIC METABOLIC PANEL
Anion gap: 9 (ref 5–15)
BUN: 17 mg/dL (ref 8–23)
CO2: 25 mmol/L (ref 22–32)
Calcium: 8.8 mg/dL — ABNORMAL LOW (ref 8.9–10.3)
Chloride: 103 mmol/L (ref 98–111)
Creatinine, Ser: 0.94 mg/dL (ref 0.61–1.24)
GFR calc Af Amer: >60 mL/min (ref >60)
GFR calc non Af Amer: >60 mL/min (ref >60)
Glucose, Bld: 130 mg/dL — ABNORMAL HIGH (ref 70–99)
Potassium: 3.3 mmol/L — ABNORMAL LOW (ref 3.5–5.1)
Sodium: 137 mmol/L (ref 135–145)

## 2018-04-08 LAB — HEPARIN LEVEL (UNFRACTIONATED): Heparin Unfractionated: 0.49 IU/mL (ref 0.30–0.70)

## 2018-04-08 LAB — CBC
HCT: 36.6 % — ABNORMAL LOW (ref 39.0–52.0)
Hemoglobin: 12.8 g/dL — ABNORMAL LOW (ref 13.0–17.0)
MCH: 31.8 pg (ref 26.0–34.0)
MCHC: 35 g/dL (ref 30.0–36.0)
MCV: 90.8 fL (ref 80.0–100.0)
Platelets: 219 10*3/uL (ref 150–400)
RBC: 4.03 MIL/uL — ABNORMAL LOW (ref 4.22–5.81)
RDW: 12.3 % (ref 11.5–15.5)
WBC: 10.3 10*3/uL (ref 4.0–10.5)
nRBC: 0 % (ref 0.0–0.2)

## 2018-04-08 LAB — POCT I-STAT TROPONIN I: TROPONIN I, POC: 0 ng/mL (ref 0.00–0.08)

## 2018-04-08 MED ORDER — HYDROXYZINE HCL 10 MG PO TABS
10.0000 mg | ORAL_TABLET | Freq: Three times a day (TID) | ORAL | Status: DC | PRN
  Administered 2018-04-08: 10 mg via ORAL
  Filled 2018-04-08 (×3): qty 1

## 2018-04-08 MED ORDER — OXYCODONE HCL 5 MG PO TABS
5.0000 mg | ORAL_TABLET | ORAL | Status: DC | PRN
  Administered 2018-04-08 – 2018-04-09 (×4): 5 mg via ORAL
  Filled 2018-04-08 (×4): qty 1

## 2018-04-08 MED ORDER — METHOCARBAMOL 500 MG PO TABS
500.0000 mg | ORAL_TABLET | Freq: Three times a day (TID) | ORAL | Status: DC
  Administered 2018-04-08 – 2018-04-09 (×3): 500 mg via ORAL
  Filled 2018-04-08 (×3): qty 1

## 2018-04-08 MED ORDER — SENNOSIDES-DOCUSATE SODIUM 8.6-50 MG PO TABS
2.0000 | ORAL_TABLET | Freq: Two times a day (BID) | ORAL | Status: DC
  Administered 2018-04-08 – 2018-04-09 (×3): 2 via ORAL
  Filled 2018-04-08 (×3): qty 2

## 2018-04-08 NOTE — Care Management (Signed)
#   7. PRIMARY INS : MEDICARE        SECONDARY  INS : MEDICAID  OF [place]       EFF-DATE : 03/10/2018      CO-PAY- $ 3.80  FOR EACH PRESCRIPTION  ELIQUIS  5 MG BID  XARELTO  15 MG BID

## 2018-04-08 NOTE — Progress Notes (Signed)
[place] for IV Heparin Indication: pulmonary embolus  Allergies  Allergen Reactions  . Vioxx [Rofecoxib] Swelling and Other (See Comments)    Caused mini-strokes    Patient Measurements: Height: 5\' 6"  (167.6 cm) Weight: 175 lb (79.4 kg) IBW/kg (Calculated) : 63.8 Heparin Dosing Weight: 79.4 kg  Vital Signs: Temp: 98.2 F (36.8 C) (12/30 0454) Temp Source: Oral (12/30 0454) BP: 158/75 (12/30 0949) Pulse Rate: 60 (12/30 0454)  Labs: Recent Labs    04/07/18 1136 04/07/18 2032 04/08/18 0448  HGB 13.3  --  12.8*  HCT 39.7  --  36.6*  PLT 238  --  219  HEPARINUNFRC  --  0.29* 0.49  CREATININE 0.94  --  0.94    Estimated Creatinine Clearance: 65.2 mL/min (by C-G formula based on SCr of 0.94 mg/dL).   Medical History: Past Medical History:  Diagnosis Date  . AAA (abdominal aortic aneurysm) ([place])   . Arthritis   . GERD (gastroesophageal reflux disease)   . Hyperlipidemia   . Hypertension   . Lung nodule   . TIA (transient ischemic attack)    Assessment: 77 year old male with confirmed PE on CT Angio (RV:LV 0.69) to start IV Heparin per pharmacy consult.   Heparin level is therapeutic this AM at 0.49. Will count first level as therapeutic at 0.29.   Goal of Therapy:  Heparin level 0.3-0.7 units/ml Monitor platelets by anticoagulation protocol: Yes   Plan:  Continue heparin at 1500 units/hr Daily Heparin level and CBC F/u with oral [facility]  Onnie Boer, PharmD, BCIDP, AAHIVP, CPP Infectious Disease Pharmacist 04/08/2018 12:23 PM

## 2018-04-08 NOTE — Progress Notes (Signed)
Patient Demographics:    Andrew Hunt, is a 77 y.o. male, DOB - 10-May-1940, KVQ:[id number]  Admit date - 04/07/2018   Admitting Physician Sid Falcon, MD  Outpatient Primary MD for the patient is Dione Housekeeper, MD  LOS - 1   Chief Complaint  Patient presents with  . Arm Injury        Subjective:    [employer] today has no fevers, no emesis, no significant hemoptysis at this time, complains of headache and neck pain, CT head and CT C-spine report noted no acute findings, wife at bedside, questions answered  Assessment  & Plan :    Active Problems:   Lung nodule   Hypertension   Hyperlipidemia   GERD (gastroesophageal reflux disease)   Arthritis   TIA (transient ischemic attack)   Pulmonary embolus (HCC)   Neck pain   SOB (shortness of breath)  Brief summary  77 y.o. male with medical history significant of H/O TIA, HTN, HLD, GERD, AAA (7-8 years, gets yearly ultrasound), arthritis who presents for SOB and hemoptysis admitted on 04/07/2018 with hemoptysis and found to have unprovoked pulmonary embolism   Plan:- 1) unprovoked PE--- continue IV heparin patient continues to complain of headache and neck pain, CT head and CT C-spine without acute findings specifically no bleeding, if no bleeding concerns on 04/09/2018 may switch from IV heparin to oral anticoagulant--- patient may need lifelong anticoagulation, outpatient malignancy work-up advised  2)HTN--stable, continue Bystolic and amlodipine  3)Rt ARM cellulitis--- clinically improving, continue doxycycline, venous Dopplers without DVT  4) neck spasms/headaches--- see #1 above,, methocarbamol as prescribed, oxycodone as prescribed,  5)HLD + history of TIA--continue aspirin and Lipitor  6) reformed smoker--- congratulated on quitting smoking continues to use Chantix  Disposition/Need for in-Hospital Stay- patient unable to be  discharged at this time due to acute PE requiring IV heparin in the setting of headaches with concerns about possible bleeding risk  Code Status : Full  Family Communication:   Wife at bedside   Disposition Plan  : TBD  Consults  :  n/a  DVT Prophylaxis  :  Iv heparin  Lab Results  Component Value Date   PLT 219 04/08/2018    Inpatient Medications  Scheduled Meds: . amLODipine  5 mg Oral Daily  . aspirin EC  81 mg Oral Daily  . atorvastatin  40 mg Oral Daily  . doxycycline  100 mg Oral BID  . lidocaine  1 patch Transdermal Q24H  . methocarbamol  500 mg Oral TID  . nebivolol  10 mg Oral Daily  . pantoprazole  40 mg Oral Daily  . senna-docusate  2 tablet Oral BID  . sodium chloride flush  3 mL Intravenous Q12H  . tamsulosin  0.4 mg Oral QHS  . varenicline  0.5 mg Oral BID   Continuous Infusions: . heparin 1,500 Units/hr (04/08/18 0721)   PRN Meds:.acetaminophen **OR** acetaminophen, HYDROmorphone (DILAUDID) injection, hydrOXYzine, oxyCODONE    Anti-infectives (From admission, onward)   Start     Dose/Rate Route Frequency Ordered Stop   04/07/18 2200  doxycycline (VIBRA-TABS) tablet 100 mg    Note to Pharmacy:  21 doses remaining     100 mg Oral 2 times daily 04/07/18 1806  Objective:   Vitals:   04/07/18 2048 04/08/18 0454 04/08/18 0949 04/08/18 1430  BP: (!) 147/64 (!) 155/82 (!) 158/75 (!) 158/68  Pulse: (!) 58 60  (!) 55  Resp: 18     Temp: 98.7 F (37.1 C) 98.2 F (36.8 C)  98.6 F (37 C)  TempSrc: Oral Oral  Oral  SpO2: 93% 97%    Weight:      Height:        Wt Readings from Last 3 Encounters:  04/07/18 79.4 kg  12/16/16 74.8 kg  05/05/14 74.8 kg     Intake/Output Summary (Last 24 hours) at 04/08/2018 1931 Last data filed at 04/08/2018 1500 Gross per 24 hour  Intake 249.83 ml  Output -  Net 249.83 ml    Physical Exam Patient is examined daily including today on 04/08/18 , exams remain the same as of yesterday except that has  changed   Gen:- Awake Alert,  In no apparent distress  HEENT:- [place].AT, No sclera icterus Neck-discomfort with range of motion of the neck, no step deformity or crepitus on palpation over the spinous processes  lungs-  CTAB , fair symmetrical air movement CV- S1, S2 normal, regular  Abd-  +ve B.Sounds, Abd Soft, No tenderness,    Extremity/Skin:-Right upper extremity edema improving, pedal pulses present  Psych-affect is appropriate, oriented x3 Neuro-no new focal deficits, no tremors   Data Review:   Micro Results No results found for this or any previous visit (from the past 240 hour(s)).  Radiology Reports Ct Head Wo Contrast  Result Date: 04/08/2018 CLINICAL DATA:  Occipital headaches EXAM: CT HEAD WITHOUT CONTRAST TECHNIQUE: Contiguous axial images were obtained from the base of the skull through the vertex without intravenous contrast. COMPARISON:  None. FINDINGS: Brain: There is mild to moderate diffuse atrophy. There is no intracranial mass, hemorrhage, extra-axial fluid collection, or midline shift. There is small vessel disease in the centra semiovale bilaterally. There are prior lacunar infarcts in each lentiform nucleus, more anteriorly on the right and more posteriorly on the left. There is also does small vessel disease in the anterior limb of each internal capsule. There is a small prior lacunar infarct in the left thalamus. There is evidence of a prior small infarct at the junction of the basal ganglia and inferior left centrum semiovale near the midportion of the left lateral ventricle. No acute infarct is demonstrable on this study. Vascular: There is no appreciable hyperdense vessel. There is calcification in each distal vertebral artery and carotid siphon region. Skull: The bony calvarium appears intact. Sinuses/Orbits: There is mucosal thickening in several ethmoid air cells. There is also mucosal thickening in each anterior aspect of the sphenoid sinuses. Other visualized  paranasal sinuses are clear. Orbits appear symmetric bilaterally. Other: Mastoid air cells are clear. 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 There is small vessel disease in the centra semiovale bilaterally. No acute infarct evident. No mass or hemorrhage. There are foci of arterial vascular calcification. There are several areas of paranasal sinus disease. Electronically Signed   By: Lowella Grip III M.D.   On: 04/08/2018 13:47   Ct Angio Chest Pe W Or Wo Contrast  Result Date: 04/07/2018 CLINICAL DATA:  Dyspnea and hemoptysis with right arm swelling and neck pain. EXAM: CT ANGIOGRAPHY CHEST WITH CONTRAST TECHNIQUE: Multidetector CT imaging of the chest was performed using the standard protocol during bolus administration of intravenous contrast. Multiplanar CT image reconstructions and  MIPs were obtained to evaluate the vascular anatomy. CONTRAST:  52mL ISOVUE-370 IOPAMIDOL (ISOVUE-370) INJECTION 76% COMPARISON:  12/10/2014 FINDINGS: Cardiovascular: Nonocclusive bilateral lower lobe segmental and subsegmental pulmonary emboli. No heart strain with RV/LV ratio approximately 0.69, within normal. Cardiomegaly is noted without pericardial effusion. Left main and three-vessel coronary arteriosclerosis is identified. Aortic atherosclerosis without aneurysm or dissection. Mediastinum/Nodes: Mildly enlarged mediastinal nodes to 13 mm. Small bilateral hilar lymph nodes without pathologic enlargement. Trachea is patent as are the mainstem bronchi. The CT appearance of the esophagus is unremarkable. No thyromegaly or mass. Lungs/Pleura: 5 mm subpleural right upper lobe pulmonary nodule noted posteriorly, series 6/32. Similar sized calcified nodule in the superior segment of right lower lobe, series 6/44. Two adjacent nodular densities along the minor fissure may reflect fissural nodes these measure up to 7 mm, series 6/70. Centrilobular and paraseptal  emphysema is seen bilaterally. Upper Abdomen: Small hiatal hernia. No acute abnormality. Cholecystectomy. Musculoskeletal: No chest wall abnormality. No acute or significant osseous findings. Review of the MIP images confirms the above findings. IMPRESSION: 1. Positive for acute PE without CT evidence of right heart strain (RV/LV Ratio = 0.69). Centrilobular and paraseptal emphysema. These results were called by telephone at the time of interpretation on 04/07/2018 at 2:29 pm to Dr. Gerlene Fee , who verbally acknowledged these results. 2. Stable right upper, superior segment right lower and right middle lobe pulmonary nodules measuring up to 7 mm since 2016 comparison. Findings may be secondary to granulomatous disease. Aortic Atherosclerosis (ICD10-I70.0) and Emphysema (ICD10-J43.9). Emphysema (ICD10-J43.9). Electronically Signed   By: Ashley Royalty M.D.   On: 04/07/2018 14:29   Ct Cervical Spine Wo Contrast  Result Date: 04/07/2018 CLINICAL DATA:  Right-sided neck pain radiating to the shoulder and upper back starting 5 days ago after long car ride. EXAM: CT CERVICAL SPINE WITHOUT CONTRAST TECHNIQUE: Multidetector CT imaging of the cervical spine was performed without intravenous contrast. Multiplanar CT image reconstructions were also generated. COMPARISON:  None. FINDINGS: Alignment: Maintained cervical lordosis with slight grade 1 retrolisthesis of C5 on C6 by 2 mm likely on the basis of degenerative disc and facet arthropathy. Intact atlantodental interval. Intact craniocervical relationship. No splaying of the lateral masses of C1 on C2. Skull base and vertebrae: Intact skull base. No acute cervical spine fracture or suspicious osseous lesions. Soft tissues and spinal canal: No prevertebral fluid or swelling. No visible canal hematoma. Disc levels: No significant central canal stenosis. Moderate disc flattening at C5-6 and C6-7. Multilevel degenerative facet arthropathy most prominent on the right at  C3-4 and C4-5. Mild uncovertebral joint osteoarthritis on the right at C3-4, bilaterally at C4-5, C5-6 and C6-7. Slight foraminal encroachment from uncinate spurring is identified on the right at C4-5, bilaterally at C5-6 and C6-7. Upper chest: Negative. Other: None. 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 Electronically Signed   By: Ashley Royalty M.D.   On: 04/07/2018 14:12   Dg Chest Port 1 View  Result Date: 04/07/2018 CLINICAL DATA:  Shortness of breath. Neck pain that goes down left arm. EXAM: PORTABLE CHEST 1 VIEW COMPARISON:  Chest CT 12/10/2014 FINDINGS: Two AP views of the chest were obtained. Lung bases are incompletely evaluated on both images. Few densities at the left lung base are nonspecific and could be related to atelectasis. Heart size appears to be mildly enlarged. Atherosclerotic calcifications at the aortic arch. IMPRESSION: Incomplete evaluation of the lung bases. Patchy densities at the left lung base are nonspecific. Findings could  be related to atelectasis but cannot exclude consolidation or airspace disease. Recommend repeat imaging with PA and lateral chest views. Electronically Signed   By: Markus Daft M.D.   On: 04/07/2018 12:32   Ue Venous Duplex ([facility] And [facility] Only)  Result Date: 04/07/2018 UPPER VENOUS STUDY  Indications: Pain Comparison Study: No prior study Performing Technologist: Sharion Dove RVS  Examination Guidelines: A complete evaluation includes B-mode imaging, spectral Doppler, color Doppler, and power Doppler as needed of all accessible portions of each vessel. Bilateral testing is considered an integral part of a complete examination. Limited examinations for reoccurring indications may be performed as noted.  Right Findings: +----------+------------+----------+---------+-----------+----------+ RIGHT     CompressiblePropertiesPhasicitySpontaneous Summary    +----------+------------+----------+---------+-----------+----------+ IJV           Full                 Yes       Yes               +----------+------------+----------+---------+-----------+----------+ Subclavian                         Yes       Yes    visualized +----------+------------+----------+---------+-----------+----------+ Axillary      Full                                             +----------+------------+----------+---------+-----------+----------+ Brachial      Full                                             +----------+------------+----------+---------+-----------+----------+ Radial                                              visualized +----------+------------+----------+---------+-----------+----------+ Ulnar                                               visualized +----------+------------+----------+---------+-----------+----------+ Cephalic      Full                                             +----------+------------+----------+---------+-----------+----------+ Basilic       Full                                             +----------+------------+----------+---------+-----------+----------+  Left Findings: +----------+------------+----------+---------+-----------+-------+ LEFT      CompressiblePropertiesPhasicitySpontaneousSummary +----------+------------+----------+---------+-----------+-------+ Subclavian                         Yes       Yes            +----------+------------+----------+---------+-----------+-------+  Summary:  Right: No evidence of deep vein thrombosis in the upper extremity. No evidence of superficial vein thrombosis in the upper extremity.  Left: No evidence of thrombosis in the subclavian.  *See table(s) above for measurements and observations.    Preliminary      CBC Recent Labs  Lab 04/07/18 1136 04/08/18 0448  WBC 13.3* 10.3  HGB 13.3 12.8*  HCT 39.7 36.6*  PLT 238 219  MCV 92.1  90.8  MCH 30.9 31.8  MCHC 33.5 35.0  RDW 12.4 12.3    Chemistries  Recent Labs  Lab 04/07/18 1136 04/08/18 0448  NA 135 137  K 3.7 3.3*  CL 101 103  CO2 23 25  GLUCOSE 143* 130*  BUN 14 17  CREATININE 0.94 0.94  CALCIUM 9.4 8.8*  AST 19  --   ALT 21  --   ALKPHOS 44  --   BILITOT 0.5  --    ------------------------------------------------------------------------------------------------------------------ No results for input(s): CHOL, HDL, LDLCALC, TRIG, CHOLHDL, LDLDIRECT in the last 72 hours.  No results found for: HGBA1C ------------------------------------------------------------------------------------------------------------------ No results for input(s): TSH, T4TOTAL, T3FREE, THYROIDAB in the last 72 hours.  Invalid input(s): FREET3 ------------------------------------------------------------------------------------------------------------------ No results for input(s): VITAMINB12, FOLATE, FERRITIN, TIBC, IRON, RETICCTPCT in the last 72 hours.  Coagulation profile No results for input(s): INR, PROTIME in the last 168 hours.  No results for input(s): DDIMER in the last 72 hours.  Cardiac Enzymes No results for input(s): CKMB, TROPONINI, MYOGLOBIN in the last 168 hours.  Invalid input(s): CK ------------------------------------------------------------------------------------------------------------------    Component Value Date/Time   BNP 113.1 (H) 04/07/2018 1136     Miran Kautzman M.D on 04/08/2018 at 7:31 PM  Pager---[phone] Go to [email or web address] - password [email or web address] for contact info  [facility] - Office  [phone]

## 2018-04-09 LAB — CBC
HCT: 37.1 % — ABNORMAL LOW (ref 39.0–52.0)
Hemoglobin: 12.5 g/dL — ABNORMAL LOW (ref 13.0–17.0)
MCH: 30.4 pg (ref 26.0–34.0)
MCHC: 33.7 g/dL (ref 30.0–36.0)
MCV: 90.3 fL (ref 80.0–100.0)
NRBC: 0 % (ref 0.0–0.2)
Platelets: 235 10*3/uL (ref 150–400)
RBC: 4.11 MIL/uL — ABNORMAL LOW (ref 4.22–5.81)
RDW: 12.3 % (ref 11.5–15.5)
WBC: 11.4 10*3/uL — ABNORMAL HIGH (ref 4.0–10.5)

## 2018-04-09 LAB — HEPARIN LEVEL (UNFRACTIONATED): Heparin Unfractionated: 0.34 IU/mL (ref 0.30–0.70)

## 2018-04-09 MED ORDER — DOXYCYCLINE HYCLATE 100 MG PO CAPS: 100.0000 mg | ORAL_CAPSULE | Freq: Two times a day (BID) | ORAL | 0 refills | Status: AC

## 2018-04-09 MED ORDER — NEBIVOLOL HCL 10 MG PO TABS: 10.0000 mg | ORAL_TABLET | Freq: Every day | ORAL | 5 refills | Status: DC

## 2018-04-09 MED ORDER — METHOCARBAMOL 500 MG PO TABS: 500.0000 mg | ORAL_TABLET | Freq: Three times a day (TID) | ORAL | 1 refills | Status: DC

## 2018-04-09 MED ORDER — ACETAMINOPHEN 325 MG PO TABS: 650.0000 mg | ORAL_TABLET | Freq: Four times a day (QID) | ORAL | 1 refills | Status: AC | PRN

## 2018-04-09 MED ORDER — ELIQUIS 5 MG VTE STARTER PACK: ORAL_TABLET | ORAL | 0 refills | Status: DC

## 2018-04-09 MED ORDER — APIXABAN 5 MG PO TABS: 5.0000 mg | ORAL_TABLET | Freq: Two times a day (BID) | ORAL | Status: DC

## 2018-04-09 MED ORDER — APIXABAN 5 MG PO TABS
10.0000 mg | ORAL_TABLET | Freq: Two times a day (BID) | ORAL | Status: DC
  Administered 2018-04-09: 10 mg via ORAL
  Filled 2018-04-09: qty 2

## 2018-04-09 MED ORDER — OXYCODONE HCL 5 MG PO TABS: 5.0000 mg | ORAL_TABLET | Freq: Four times a day (QID) | ORAL | 0 refills | Status: DC | PRN

## 2018-04-09 MED ORDER — APIXABAN 5 MG PO TABS: 5.0000 mg | ORAL_TABLET | Freq: Two times a day (BID) | ORAL | 2 refills | Status: DC

## 2018-04-09 MED ORDER — SENNOSIDES-DOCUSATE SODIUM 8.6-50 MG PO TABS: 2.0000 | ORAL_TABLET | Freq: Two times a day (BID) | ORAL | 2 refills | Status: DC

## 2018-04-09 MED FILL — ELIQUIS STARTER PACK 5 MG T: 5 | 30 days supply | Qty: 74 | Fill #0

## 2018-04-09 NOTE — Discharge Summary (Signed)
Andrew Hunt, is a 77 y.o. male  DOB 1940-05-03  MRN [id number].  Admission date:  04/07/2018  Admitting Physician  Sid Falcon, MD  Discharge Date:  04/09/2018   Primary MD  Dione Housekeeper, MD  Recommendations for primary care physician for things to follow:   1)You are taking the blood thinner apixaban/Eliquis so Avoid ibuprofen/Advil/Aleve/Motrin/Goody Powders/Naproxen/BC powders/Meloxicam/Diclofenac/Indomethacin and other Nonsteroidal anti-inflammatory medications as these will make you more likely to bleed and can cause stomach ulcers, can also cause Kidney problems.  2) avoid falls or injury as the risk of bleeding is increased while on blood thinner 3) follow-up with your primary care physician within a week for recheck and reevaluation  Admission Diagnosis  Neck pain [M54.2] SOB (shortness of breath) [R06.02] Arm swelling [M79.89] Other acute pulmonary embolism without acute cor pulmonale (HCC) [I26.99]   Discharge Diagnosis  Neck pain [M54.2] SOB (shortness of breath) [R06.02] Arm swelling [M79.89] Other acute pulmonary embolism without acute cor pulmonale (HCC) [I26.99]   Active Problems:   Lung nodule   Hypertension   Hyperlipidemia   GERD (gastroesophageal reflux disease)   Arthritis   TIA (transient ischemic attack)   Pulmonary embolus (HCC)   Neck pain   SOB (shortness of breath)      Past Medical History:  Diagnosis Date  . AAA (abdominal aortic aneurysm) ([place])   . Arthritis   . GERD (gastroesophageal reflux disease)   . Hyperlipidemia   . Hypertension   . Lung nodule   . TIA (transient ischemic attack)     Past Surgical History:  Procedure Laterality Date  . APPENDECTOMY    . BACK SURGERY    . CHOLECYSTECTOMY    . COLONOSCOPY N/A 05/05/2014   Procedure: COLONOSCOPY;  Surgeon: Daneil Dolin, MD;  Location: [facility];  Service: Endoscopy;  Laterality:  N/A;  100  . MULTIPLE TOOTH EXTRACTIONS       HPI  from the history and physical done on the day of admission:    PCP: Dione Housekeeper, MD  Patient coming from: Home  Chief Complaint: SOB, neck pain  HPI: Andrew Hunt is a 77 y.o. male with medical history significant of H/O TIA, HTN, HLD, GERD, AAA (7-8 years, gets yearly ultrasound), arthritis who presents for SOB and hemoptysis.  He reports noticing the hemoptysis about 2 weeks ago, streaky and with morning coughing.  He didn't think much of it.  He had also developed worsening SOB for about 2-3 weeks.  About 1 week ago, on Christmas day, he noticed right hand and arm swelling with decreased ability to move that arm.  Family states that he has had decreased movement of that arm as well.  He went to his primary doctor on 12/26 and was diagnosed with cellulitis and the swelling and erythema have slowly gotten better with doxycycline.  The main reason he came in to day was neck stiffness.  He has chronic neck stiffness and inability to turn the head a full 90 degrees, however, he  slept on the couch 2 nights ago and awoke with neck pain and stiffness and then this morning was unable to turn his head to the right.  He has no fever, chills, nausea, vomiting, diarrhea, AMS, confusion, eye pain or difficulty moving his eyes.  He does have intermittent leg swelling but none today.  He has no recent travel or surgery.  He has no diagnosis of cancer that he is aware of (being monitored for pulmonary nodules).  He has no history of personal clotting or family history of clotting.   ED Course: In the ED, he was given robaxin and dilaudid for his neck pain and these helped some but have warn off.  Given his h/o hemoptysis and arm swelling, a CT chest was done and showed a new PE, without right heart strain.  CT spine was also done which showed some cervical spondylosis, but no acute issues.  He further had an elevated WBC to 13, an elevated lactate to 2.3.   He has not had any tachycardia or hypoxia.  Heparin was started.     Hospital Course:     Brief Summary  77 y.o.malewith medical history significant ofH/O TIA, HTN, HLD, GERD, AAA (7-8 years, gets yearly ultrasound), arthritis who presents for SOB and hemoptysis admitted on 04/07/2018 with hemoptysis and found to have unprovoked pulmonary embolism   Plan:- 1) unprovoked PE--- treated with IV heparin, headache and neck pain is improving, CT head and CT C-spine without acute findings, specifically no bleeding. Discharged on Eliquis- patient may need lifelong anticoagulation, outpatient malignancy work-up advised  2)HTN--stable, continue Bystolic and Amlodipine  3)Rt ARM cellulitis--- clinically improving, continue doxycycline, venous Dopplers without DVT  4) neck spasms/headaches--- see #1 above,, methocarbamol as prescribed, oxycodone as prescribed,  5)HLD + history of TIA--continue aspirin and Lipitor  6) reformed smoker--- congratulated on quitting smoking continues to use Chantix  Code Status : Full  Family Communication:   Wife at bedside   Disposition Plan  :  Home  Discharge Condition: stable  Follow UP---   Diet and Activity recommendation:  As advised  Discharge Instructions    Discharge Instructions    Call MD for:  difficulty breathing, headache or visual disturbances   Complete by:  As directed    Call MD for:  persistant dizziness or light-headedness   Complete by:  As directed    Call MD for:  persistant nausea and vomiting   Complete by:  As directed    Call MD for:  severe uncontrolled pain   Complete by:  As directed    Call MD for:  temperature >100.4   Complete by:  As directed    Diet - low sodium heart healthy   Complete by:  As directed    Discharge instructions   Complete by:  As directed    1) you are taking the blood thinner apixaban/Eliquis so Avoid ibuprofen/Advil/Aleve/Motrin/Goody Powders/Naproxen/BC  powders/Meloxicam/Diclofenac/Indomethacin and other Nonsteroidal anti-inflammatory medications as these will make you more likely to bleed and can cause stomach ulcers, can also cause Kidney problems.  2) avoid falls or injury as the risk of bleeding is increased while on blood thinner 3) follow-up with your primary care physician within a week for recheck and reevaluation   Increase activity slowly   Complete by:  As directed        Discharge Medications     Allergies as of 04/09/2018      Reactions   Vioxx [rofecoxib] Swelling, Other (See Comments)  Caused mini-strokes      Medication List    STOP taking these medications   aspirin EC 81 MG tablet   ibuprofen 200 MG tablet Commonly known as:  ADVIL,MOTRIN   naproxen 500 MG tablet Commonly known as:  NAPROSYN     TAKE these medications   acetaminophen 325 MG tablet Commonly known as:  TYLENOL Take 2 tablets (650 mg total) by mouth every 6 (six) hours as needed for mild pain, fever or headache (or Fever >/= 101).   amLODipine 10 MG tablet Commonly known as:  NORVASC Take 5 mg by mouth daily.   atorvastatin 40 MG tablet Commonly known as:  LIPITOR Take 40 mg by mouth daily.   doxycycline 100 MG capsule Commonly known as:  VIBRAMYCIN Take 1 capsule (100 mg total) by mouth 2 (two) times daily for 3 days. What changed:  additional instructions   ELIQUIS STARTER PACK 5 MG Tabs Take as directed on package: start with two-63m tablets twice daily for 7 days. On day 8, switch to one-549mtablet twice daily.   apixaban 5 MG Tabs tablet Commonly known as:  ELIQUIS Take 1 tablet (5 mg total) by mouth 2 (two) times daily. Start on 01/272020 after completing the starter pack Start taking on:  April 16, 2018   GLUCOSAMINE 1500 COMPLEX PO Take 1,500 mg by mouth daily.   methocarbamol 500 MG tablet Commonly known as:  ROBAXIN Take 1 tablet (500 mg total) by mouth 3 (three) times daily. What changed:    when to take  this  reasons to take this   nebivolol 10 MG tablet Commonly known as:  BYSTOLIC Take 1 tablet (10 mg total) by mouth daily. Start taking on:  April 10, 2018   omeprazole 20 MG capsule Commonly known as:  PRILOSEC Take 20 mg by mouth daily.   oxyCODONE 5 MG immediate release tablet Commonly known as:  Oxy IR/ROXICODONE Take 1 tablet (5 mg total) by mouth every 6 (six) hours as needed for moderate pain or severe pain.   peg 3350 powder 100 g Solr Commonly known as:  MOVIPREP Take 1 kit (200 g total) by mouth as directed.   senna-docusate 8.6-50 MG tablet Commonly known as:  Senokot-S Take 2 tablets by mouth 2 (two) times daily.   tamsulosin 0.4 MG Caps capsule Commonly known as:  FLOMAX Take 0.4 mg by mouth at bedtime.   varenicline 0.5 MG tablet Commonly known as:  CHANTIX Take 0.5 mg by mouth 2 (two) times daily.      Major procedures and Radiology Reports - PLEASE review detailed and final reports for all details, in brief -   Ct Head Wo Contrast  Result Date: 04/08/2018 CLINICAL DATA:  Occipital headaches EXAM: CT HEAD WITHOUT CONTRAST TECHNIQUE: Contiguous axial images were obtained from the base of the skull through the vertex without intravenous contrast. COMPARISON:  None. FINDINGS: Brain: There is mild to moderate diffuse atrophy. There is no intracranial mass, hemorrhage, extra-axial fluid collection, or midline shift. There is small vessel disease in the centra semiovale bilaterally. There are prior lacunar infarcts in each lentiform nucleus, more anteriorly on the right and more posteriorly on the left. There is also does small vessel disease in the anterior limb of each internal capsule. There is a small prior lacunar infarct in the left thalamus. There is evidence of a prior small infarct at the junction of the basal ganglia and inferior left centrum semiovale near the midportion of the left lateral  ventricle. No acute infarct is demonstrable on this study.  Vascular: There is no appreciable hyperdense vessel. There is calcification in each distal vertebral artery and carotid siphon region. Skull: The bony calvarium appears intact. Sinuses/Orbits: There is mucosal thickening in several ethmoid air cells. There is also mucosal thickening in each anterior aspect of the sphenoid sinuses. Other visualized paranasal sinuses are clear. Orbits appear symmetric bilaterally. Other: Mastoid air cells are clear. 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 There is small vessel disease in the centra semiovale bilaterally. No acute infarct evident. No mass or hemorrhage. There are foci of arterial vascular calcification. There are several areas of paranasal sinus disease. Electronically Signed   By: Lowella Grip III M.D.   On: 04/08/2018 13:47   Ct Angio Chest Pe W Or Wo Contrast  Result Date: 04/07/2018 CLINICAL DATA:  Dyspnea and hemoptysis with right arm swelling and neck pain. EXAM: CT ANGIOGRAPHY CHEST WITH CONTRAST TECHNIQUE: Multidetector CT imaging of the chest was performed using the standard protocol during bolus administration of intravenous contrast. Multiplanar CT image reconstructions and MIPs were obtained to evaluate the vascular anatomy. CONTRAST:  5m ISOVUE-370 IOPAMIDOL (ISOVUE-370) INJECTION 76% COMPARISON:  12/10/2014 FINDINGS: Cardiovascular: Nonocclusive bilateral lower lobe segmental and subsegmental pulmonary emboli. No heart strain with RV/LV ratio approximately 0.69, within normal. Cardiomegaly is noted without pericardial effusion. Left main and three-vessel coronary arteriosclerosis is identified. Aortic atherosclerosis without aneurysm or dissection. Mediastinum/Nodes: Mildly enlarged mediastinal nodes to 13 mm. Small bilateral hilar lymph nodes without pathologic enlargement. Trachea is patent as are the mainstem bronchi. The CT appearance of the esophagus is unremarkable. No  thyromegaly or mass. Lungs/Pleura: 5 mm subpleural right upper lobe pulmonary nodule noted posteriorly, series 6/32. Similar sized calcified nodule in the superior segment of right lower lobe, series 6/44. Two adjacent nodular densities along the minor fissure may reflect fissural nodes these measure up to 7 mm, series 6/70. Centrilobular and paraseptal emphysema is seen bilaterally. Upper Abdomen: Small hiatal hernia. No acute abnormality. Cholecystectomy. Musculoskeletal: No chest wall abnormality. No acute or significant osseous findings. Review of the MIP images confirms the above findings. IMPRESSION: 1. Positive for acute PE without CT evidence of right heart strain (RV/LV Ratio = 0.69). Centrilobular and paraseptal emphysema. These results were called by telephone at the time of interpretation on 04/07/2018 at 2:29 pm to Dr. MGerlene Fee, who verbally acknowledged these results. 2. Stable right upper, superior segment right lower and right middle lobe pulmonary nodules measuring up to 7 mm since 2016 comparison. Findings may be secondary to granulomatous disease. Aortic Atherosclerosis (ICD10-I70.0) and Emphysema (ICD10-J43.9). Emphysema (ICD10-J43.9). Electronically Signed   By: DAshley RoyaltyM.D.   On: 04/07/2018 14:29   Ct Cervical Spine Wo Contrast  Result Date: 04/07/2018 CLINICAL DATA:  Right-sided neck pain radiating to the shoulder and upper back starting 5 days ago after long car ride. EXAM: CT CERVICAL SPINE WITHOUT CONTRAST TECHNIQUE: Multidetector CT imaging of the cervical spine was performed without intravenous contrast. Multiplanar CT image reconstructions were also generated. COMPARISON:  None. FINDINGS: Alignment: Maintained cervical lordosis with slight grade 1 retrolisthesis of C5 on C6 by 2 mm likely on the basis of degenerative disc and facet arthropathy. Intact atlantodental interval. Intact craniocervical relationship. No splaying of the lateral masses of C1 on C2. Skull base and  vertebrae: Intact skull base. No acute cervical spine fracture or suspicious osseous lesions. Soft tissues and spinal canal: No prevertebral  fluid or swelling. No visible canal hematoma. Disc levels: No significant central canal stenosis. Moderate disc flattening at C5-6 and C6-7. Multilevel degenerative facet arthropathy most prominent on the right at C3-4 and C4-5. Mild uncovertebral joint osteoarthritis on the right at C3-4, bilaterally at C4-5, C5-6 and C6-7. Slight foraminal encroachment from uncinate spurring is identified on the right at C4-5, bilaterally at C5-6 and C6-7. Upper chest: Negative. Other: None. 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 Electronically Signed   By: Ashley Royalty M.D.   On: 04/07/2018 14:12   Dg Chest Port 1 View  Result Date: 04/07/2018 CLINICAL DATA:  Shortness of breath. Neck pain that goes down left arm. EXAM: PORTABLE CHEST 1 VIEW COMPARISON:  Chest CT 12/10/2014 FINDINGS: Two AP views of the chest were obtained. Lung bases are incompletely evaluated on both images. Few densities at the left lung base are nonspecific and could be related to atelectasis. Heart size appears to be mildly enlarged. Atherosclerotic calcifications at the aortic arch. IMPRESSION: Incomplete evaluation of the lung bases. Patchy densities at the left lung base are nonspecific. Findings could be related to atelectasis but cannot exclude consolidation or airspace disease. Recommend repeat imaging with PA and lateral chest views. Electronically Signed   By: Markus Daft M.D.   On: 04/07/2018 12:32   Ue Venous Duplex ([facility] And [facility] Only)  Result Date: 04/09/2018 UPPER VENOUS STUDY  Indications: Pain Comparison Study: No prior study Performing Technologist: Sharion Dove RVS  Examination Guidelines: A complete evaluation includes B-mode imaging, spectral Doppler, color Doppler, and power Doppler as needed of all accessible portions of each vessel.  Bilateral testing is considered an integral part of a complete examination. Limited examinations for reoccurring indications may be performed as noted.  Right Findings: +----------+------------+----------+---------+-----------+----------+ RIGHT     CompressiblePropertiesPhasicitySpontaneous Summary   +----------+------------+----------+---------+-----------+----------+ IJV           Full                 Yes       Yes               +----------+------------+----------+---------+-----------+----------+ Subclavian                         Yes       Yes    visualized +----------+------------+----------+---------+-----------+----------+ Axillary      Full                                             +----------+------------+----------+---------+-----------+----------+ Brachial      Full                                             +----------+------------+----------+---------+-----------+----------+ Radial                                              visualized +----------+------------+----------+---------+-----------+----------+ Ulnar  visualized +----------+------------+----------+---------+-----------+----------+ Cephalic      Full                                             +----------+------------+----------+---------+-----------+----------+ Basilic       Full                                             +----------+------------+----------+---------+-----------+----------+  Left Findings: +----------+------------+----------+---------+-----------+-------+ LEFT      CompressiblePropertiesPhasicitySpontaneousSummary +----------+------------+----------+---------+-----------+-------+ Subclavian                         Yes       Yes            +----------+------------+----------+---------+-----------+-------+  Summary:  Right: No evidence of deep vein thrombosis in the upper extremity. No evidence of  superficial vein thrombosis in the upper extremity.  Left: No evidence of thrombosis in the subclavian.  *See table(s) above for measurements and observations.  Diagnosing physician: Deitra Mayo MD Electronically signed by Deitra Mayo MD on 04/09/2018 at 6:38:52 AM.    Final     Micro Results   No results found for this or any previous visit (from the past 240 hour(s)).  Today   Subjective    Meko Bachtel today has no new complaints, No fever  Or chills, no nausea, vomiting, diarrhea           Patient has been seen and examined prior to discharge   Objective   Blood pressure (!) 152/60, pulse 61, temperature 98.7 F (37.1 C), temperature source Oral, resp. rate 18, height 5' 6"  (1.676 m), weight 79.4 kg, SpO2 97 %.   Intake/Output Summary (Last 24 hours) at 04/09/2018 1247 Last data filed at 04/09/2018 0900 Gross per 24 hour  Intake 483.5 ml  Output 250 ml  Net 233.5 ml    Exam Gen:- Awake Alert,  In no apparent distress  HEENT:- [place].AT, No sclera icterus Neck-discomfort with range of motion of the neck, no step deformity or crepitus on palpation over the spinous processes  Lungs-  CTAB , fair symmetrical air movement CV- S1, S2 normal, regular  Abd-  +ve B.Sounds, Abd Soft, No tenderness,    Extremity/Skin:-Right upper extremity edema improving, pedal pulses present  Psych-affect is appropriate, oriented x3 Neuro-no new focal deficits, no tremors   Data Review   CBC w Diff:  Lab Results  Component Value Date   WBC 11.4 (H) 04/09/2018   HGB 12.5 (L) 04/09/2018   HCT 37.1 (L) 04/09/2018   PLT 235 04/09/2018    CMP:  Lab Results  Component Value Date   NA 137 04/08/2018   K 3.3 (L) 04/08/2018   CL 103 04/08/2018   CO2 25 04/08/2018   BUN 17 04/08/2018   CREATININE 0.94 04/08/2018   PROT 7.7 04/07/2018   ALBUMIN 3.6 04/07/2018   BILITOT 0.5 04/07/2018   ALKPHOS 44 04/07/2018   AST 19 04/07/2018   ALT 21 04/07/2018  . Total Discharge  time is about 33 minutes  Roxan Hockey M.D on 04/09/2018 at 12:47 PM  Pager---[phone]  Go to [email or web address] - password [email or web address] for contact info  [facility] - Office  [phone]

## 2018-04-09 NOTE — Progress Notes (Signed)
[place] for IV Heparin Indication: pulmonary embolus  Allergies  Allergen Reactions  . Vioxx [Rofecoxib] Swelling and Other (See Comments)    Caused mini-strokes    Patient Measurements: Height: 5\' 6"  (167.6 cm) Weight: 175 lb (79.4 kg) IBW/kg (Calculated) : 63.8 Heparin Dosing Weight: 79.4 kg  Vital Signs: Temp: 98.7 F (37.1 C) (12/31 0545) Temp Source: Oral (12/31 0545) BP: 152/60 (12/31 0545) Pulse Rate: 61 (12/31 0545)  Labs: Recent Labs    04/07/18 1136 04/07/18 2032 04/08/18 0448 04/09/18 0401  HGB 13.3  --  12.8* 12.5*  HCT 39.7  --  36.6* 37.1*  PLT 238  --  219 235  HEPARINUNFRC  --  0.29* 0.49 0.34  CREATININE 0.94  --  0.94  --     Estimated Creatinine Clearance: 65.2 mL/min (by C-G formula based on SCr of 0.94 mg/dL).   Medical History: Past Medical History:  Diagnosis Date  . AAA (abdominal aortic aneurysm) ([place])   . Arthritis   . GERD (gastroesophageal reflux disease)   . Hyperlipidemia   . Hypertension   . Lung nodule   . TIA (transient ischemic attack)    Assessment: 77 year old male with confirmed PE on CT Angio (RV:LV 0.69) to start IV Heparin per pharmacy consult.   He will be transitioned to apixaban today. Age<80, wt>60, scr<1.5. Plan to dc today  Goal of Therapy:  Heparin level 0.3-0.7 units/ml Monitor platelets by anticoagulation protocol: Yes   Plan:  Dc heparin and levels Apixaban 10mg  BID x7d then 5mg  BID   Onnie Boer, PharmD, BCIDP, AAHIVP, CPP Infectious Disease Pharmacist 04/09/2018 10:35 AM

## 2018-04-09 NOTE — Progress Notes (Signed)
Spoke w 5W pharmacist who will send Rx to [facility] pharmacy to be filled for DC today.

## 2018-04-09 NOTE — Discharge Instructions (Signed)
1) you are taking the blood thinner apixaban/Eliquis so Avoid ibuprofen/Advil/Aleve/Motrin/Goody Powders/Naproxen/BC powders/Meloxicam/Diclofenac/Indomethacin and other Nonsteroidal anti-inflammatory medications as these will make you more likely to bleed and can cause stomach ulcers, can also cause Kidney problems.  2) avoid falls or injury as the risk of bleeding is increased while on blood thinner 3) follow-up with your primary care physician within a week for recheck and reevaluation    Information on my medicine - ELIQUIS (apixaban)  This medication education was reviewed with me or my healthcare representative as part of my discharge preparation.  The pharmacist that spoke with me during my hospital stay was:  Onnie Boer, RPH-CPP  Why was Eliquis prescribed for you? Eliquis was prescribed to treat blood clots that may have been found in the veins of your legs (deep vein thrombosis) or in your lungs (pulmonary embolism) and to reduce the risk of them occurring again.  What do You need to know about Eliquis ? The starting dose is 10 mg (two 5 mg tablets) taken TWICE daily for the FIRST SEVEN (7) DAYS, then on (enter date)  04/16/18  the dose is reduced to ONE 5 mg tablet taken TWICE daily.  Eliquis may be taken with or without food.   Try to take the dose about the same time in the morning and in the evening. If you have difficulty swallowing the tablet whole please discuss with your pharmacist how to take the medication safely.  Take Eliquis exactly as prescribed and DO NOT stop taking Eliquis without talking to the doctor who prescribed the medication.  Stopping may increase your risk of developing a new blood clot.  Refill your prescription before you run out.  After discharge, you should have regular check-up appointments with your healthcare provider that is prescribing your Eliquis.    What do you do if you miss a dose? If a dose of ELIQUIS is not taken at the scheduled time,  take it as soon as possible on the same day and twice-daily administration should be resumed. The dose should not be doubled to make up for a missed dose.  Important Safety Information A possible side effect of Eliquis is bleeding. You should call your healthcare provider right away if you experience any of the following: ? Bleeding from an injury or your nose that does not stop. ? Unusual colored urine (red or dark brown) or unusual colored stools (red or black). ? Unusual bruising for unknown reasons. ? A serious fall or if you hit your head (even if there is no bleeding).  Some medicines may interact with Eliquis and might increase your risk of bleeding or clotting while on Eliquis. To help avoid this, consult your healthcare provider or pharmacist prior to using any new prescription or non-prescription medications, including herbals, vitamins, non-steroidal anti-inflammatory drugs (NSAIDs) and supplements.  This website has more information on Eliquis (apixaban): http://www.eliquis.com/eliquis/home    1) you are taking the blood thinner apixaban/Eliquis so Avoid ibuprofen/Advil/Aleve/Motrin/Goody Powders/Naproxen/BC powders/Meloxicam/Diclofenac/Indomethacin and other Nonsteroidal anti-inflammatory medications as these will make you more likely to bleed and can cause stomach ulcers, can also cause Kidney problems.  2) avoid falls or injury as the risk of bleeding is increased while on blood thinner 3) follow-up with your primary care physician within a week for recheck and reevaluation

## 2018-04-16 NOTE — Progress Notes (Signed)
Transitions of Care Follow Up Call Note  Andrew Hunt is an 78 y.o. male who presented to [facility] on 04/07/2018.  The patient had the following prescriptions filled at [facility]: Eliquis, methocarbamol, senna, oxycodone  Patient was called by pharmacist and HIPAA identifiers were verified. The following questions were asked about the prescriptions filled at [facility]:  Has the patient been experiencing any side effects to the medications prescribed? no Understanding of regimen: good Understanding of indications: good Potential of compliance: good  Pharmacist comments: none  [x]  Patient's prescriptions filled at the [facility] were transferred to the following pharmacy: [facility] ([address], [place]).   []  Patient unable to be reached after calling three times and prescriptions filled at the [facility] Transitions of Care Pharmacy were transferred to preferred pharmacy found within their chart.  Ronna Polio 04/16/2018, 5:31 PM Transitions of Care Pharmacy Hours: Monday - Friday 8:30am to 5:00 PM  Phone - [phone]

## 2018-06-26 ENCOUNTER — Other Ambulatory Visit (HOSPITAL_COMMUNITY): Payer: Self-pay | Admitting: Family Medicine

## 2018-06-26 ENCOUNTER — Ambulatory Visit (HOSPITAL_COMMUNITY)
Admission: RE | Admit: 2018-06-26 | Discharge: 2018-06-26 | Disposition: A | Discharge status: Home / Self Care | Payer: Medicare Other | Source: Ambulatory Visit | Attending: Family Medicine | Admitting: Family Medicine

## 2018-06-26 ENCOUNTER — Other Ambulatory Visit: Payer: Self-pay

## 2018-06-26 ENCOUNTER — Other Ambulatory Visit: Payer: Self-pay | Admitting: Family Medicine

## 2018-06-26 DIAGNOSIS — M7989 Other specified soft tissue disorders: Secondary | ICD-10-CM | POA: Diagnosis present

## 2019-03-16 ENCOUNTER — Emergency Department (HOSPITAL_COMMUNITY): Payer: Medicare Other

## 2019-03-16 ENCOUNTER — Encounter (HOSPITAL_COMMUNITY): Payer: Self-pay

## 2019-03-16 ENCOUNTER — Emergency Department (HOSPITAL_COMMUNITY)
Admission: EM | Admit: 2019-03-16 | Discharge: 2019-03-16 | Disposition: A | Discharge status: Home / Self Care | Payer: Medicare Other | Attending: Emergency Medicine | Admitting: Emergency Medicine

## 2019-03-16 ENCOUNTER — Other Ambulatory Visit: Payer: Self-pay

## 2019-03-16 DIAGNOSIS — R1013 Epigastric pain: Secondary | ICD-10-CM | POA: Diagnosis present

## 2019-03-16 DIAGNOSIS — I714 Abdominal aortic aneurysm, without rupture, unspecified: Secondary | ICD-10-CM

## 2019-03-16 DIAGNOSIS — Z79899 Other long term (current) drug therapy: Secondary | ICD-10-CM | POA: Insufficient documentation

## 2019-03-16 DIAGNOSIS — N10 Acute tubulo-interstitial nephritis: Secondary | ICD-10-CM | POA: Diagnosis not present

## 2019-03-16 DIAGNOSIS — Z7901 Long term (current) use of anticoagulants: Secondary | ICD-10-CM | POA: Insufficient documentation

## 2019-03-16 DIAGNOSIS — I10 Essential (primary) hypertension: Secondary | ICD-10-CM | POA: Diagnosis not present

## 2019-03-16 DIAGNOSIS — E876 Hypokalemia: Secondary | ICD-10-CM | POA: Insufficient documentation

## 2019-03-16 DIAGNOSIS — Z87891 Personal history of nicotine dependence: Secondary | ICD-10-CM | POA: Insufficient documentation

## 2019-03-16 DIAGNOSIS — Z20828 Contact with and (suspected) exposure to other viral communicable diseases: Secondary | ICD-10-CM | POA: Diagnosis not present

## 2019-03-16 DIAGNOSIS — Z8673 Personal history of transient ischemic attack (TIA), and cerebral infarction without residual deficits: Secondary | ICD-10-CM | POA: Insufficient documentation

## 2019-03-16 DIAGNOSIS — N12 Tubulo-interstitial nephritis, not specified as acute or chronic: Secondary | ICD-10-CM

## 2019-03-16 LAB — URINALYSIS, ROUTINE W REFLEX MICROSCOPIC
Bacteria, UA: NONE SEEN
Bilirubin Urine: NEGATIVE
Glucose, UA: NEGATIVE mg/dL
Hgb urine dipstick: NEGATIVE
Ketones, ur: NEGATIVE mg/dL
Nitrite: NEGATIVE
Protein, ur: NEGATIVE mg/dL
Specific Gravity, Urine: 1.018 (ref 1.005–1.030)
pH: 6 (ref 5.0–8.0)

## 2019-03-16 LAB — CBC WITH DIFFERENTIAL/PLATELET
Abs Immature Granulocytes: 0.12 10*3/uL — ABNORMAL HIGH (ref 0.00–0.07)
Basophils Absolute: 0.1 10*3/uL (ref 0.0–0.1)
Basophils Relative: 1 %
Eosinophils Absolute: 0.1 10*3/uL (ref 0.0–0.5)
Eosinophils Relative: 1 %
HCT: 31.5 % — ABNORMAL LOW (ref 39.0–52.0)
Hemoglobin: 10.8 g/dL — ABNORMAL LOW (ref 13.0–17.0)
Immature Granulocytes: 1 %
Lymphocytes Relative: 10 %
Lymphs Abs: 0.8 10*3/uL (ref 0.7–4.0)
MCH: 30.9 pg (ref 26.0–34.0)
MCHC: 34.3 g/dL (ref 30.0–36.0)
MCV: 90.3 fL (ref 80.0–100.0)
Monocytes Absolute: 1.7 10*3/uL — ABNORMAL HIGH (ref 0.1–1.0)
Monocytes Relative: 21 %
Neutro Abs: 5.6 10*3/uL (ref 1.7–7.7)
Neutrophils Relative %: 66 %
Platelets: 146 10*3/uL — ABNORMAL LOW (ref 150–400)
RBC: 3.49 MIL/uL — ABNORMAL LOW (ref 4.22–5.81)
RDW: 14.3 % (ref 11.5–15.5)
WBC: 8.4 10*3/uL (ref 4.0–10.5)
nRBC: 0 % (ref 0.0–0.2)

## 2019-03-16 LAB — COMPREHENSIVE METABOLIC PANEL
ALT: 60 U/L — ABNORMAL HIGH (ref 0–44)
AST: 46 U/L — ABNORMAL HIGH (ref 15–41)
Albumin: 2.7 g/dL — ABNORMAL LOW (ref 3.5–5.0)
Alkaline Phosphatase: 95 U/L (ref 38–126)
Anion gap: 10 (ref 5–15)
BUN: 33 mg/dL — ABNORMAL HIGH (ref 8–23)
CO2: 23 mmol/L (ref 22–32)
Calcium: 8.4 mg/dL — ABNORMAL LOW (ref 8.9–10.3)
Chloride: 101 mmol/L (ref 98–111)
Creatinine, Ser: 1.33 mg/dL — ABNORMAL HIGH (ref 0.61–1.24)
GFR calc Af Amer: 59 mL/min — ABNORMAL LOW (ref >60)
GFR calc non Af Amer: 51 mL/min — ABNORMAL LOW (ref >60)
Glucose, Bld: 124 mg/dL — ABNORMAL HIGH (ref 70–99)
Potassium: 3.1 mmol/L — ABNORMAL LOW (ref 3.5–5.1)
Sodium: 134 mmol/L — ABNORMAL LOW (ref 135–145)
Total Bilirubin: 0.7 mg/dL (ref 0.3–1.2)
Total Protein: 6.3 g/dL — ABNORMAL LOW (ref 6.5–8.1)

## 2019-03-16 LAB — LIPASE, BLOOD: Lipase: 30 U/L (ref 11–51)

## 2019-03-16 LAB — LACTIC ACID, PLASMA: Lactic Acid, Venous: 1.2 mmol/L (ref 0.5–1.9)

## 2019-03-16 LAB — POC SARS CORONAVIRUS 2 AG -  ED: SARS Coronavirus 2 Ag: NEGATIVE

## 2019-03-16 MED ORDER — MORPHINE SULFATE (PF) 4 MG/ML IV SOLN
4.0000 mg | Freq: Once | INTRAVENOUS | Status: AC
  Administered 2019-03-16: 4 mg via INTRAVENOUS
  Filled 2019-03-16: qty 1

## 2019-03-16 MED ORDER — OXYCODONE-ACETAMINOPHEN 5-325 MG PO TABS: 1.0000 | ORAL_TABLET | Freq: Three times a day (TID) | ORAL | 0 refills | Status: DC | PRN

## 2019-03-16 MED ORDER — OXYCODONE-ACETAMINOPHEN 5-325 MG PO TABS
1.0000 | ORAL_TABLET | Freq: Once | ORAL | Status: DC
  Filled 2019-03-16: qty 1

## 2019-03-16 MED ORDER — CEPHALEXIN 500 MG PO CAPS: 500.0000 mg | ORAL_CAPSULE | Freq: Four times a day (QID) | ORAL | 0 refills | Status: AC

## 2019-03-16 MED ORDER — SODIUM CHLORIDE (PF) 0.9 % IJ SOLN
INTRAMUSCULAR | Status: AC
  Administered 2019-03-16: 15:00:00
  Filled 2019-03-16: qty 50

## 2019-03-16 MED ORDER — ONDANSETRON HCL 4 MG/2ML IJ SOLN
4.0000 mg | Freq: Once | INTRAMUSCULAR | Status: AC
  Administered 2019-03-16: 4 mg via INTRAVENOUS
  Filled 2019-03-16: qty 2

## 2019-03-16 MED ORDER — SODIUM CHLORIDE 0.9 % IV BOLUS
1000.0000 mL | Freq: Once | INTRAVENOUS | Status: AC
  Administered 2019-03-16: 1000 mL via INTRAVENOUS

## 2019-03-16 MED ORDER — POTASSIUM CHLORIDE CRYS ER 20 MEQ PO TBCR
40.0000 meq | EXTENDED_RELEASE_TABLET | Freq: Once | ORAL | Status: AC
  Administered 2019-03-16: 40 meq via ORAL
  Filled 2019-03-16: qty 2

## 2019-03-16 MED ORDER — IOHEXOL 350 MG/ML SOLN
100.0000 mL | Freq: Once | INTRAVENOUS | Status: AC | PRN
  Administered 2019-03-16: 100 mL via INTRAVENOUS

## 2019-03-16 MED ORDER — SODIUM CHLORIDE 0.9 % IV SOLN
2.0000 g | Freq: Once | INTRAVENOUS | Status: AC
  Administered 2019-03-16: 2 g via INTRAVENOUS
  Filled 2019-03-16: qty 20

## 2019-03-16 NOTE — ED Notes (Signed)
Portable X-ray at patient bedside.

## 2019-03-16 NOTE — ED Notes (Signed)
ED PA at bedside with patient. 

## 2019-03-16 NOTE — ED Triage Notes (Signed)
Patient is AOx4 and ambulatory. Patient arrived via [facility] from home. Patient chief complaint is abdominal pain, fever, SOB. Patient stated symptoms began 5 days ago on or around Tuesday of last week December 1st. Patient is on room air.   Highest Temperature recorded by EMS was 100.6 Temporal.

## 2019-03-16 NOTE — ED Provider Notes (Signed)
[facility] DEPT Provider Note   CSN: [id number] Arrival date & time: 03/16/19  1044     History   Chief Complaint Chief Complaint  Patient presents with   Abdominal Pain   Shortness of Breath   Fever    HPI Andrew Hunt is a 78 y.o. male with PMH/o AAA, GERD, HTN, HLD, TIA brought in by EMS for evaluation of abdominal pain, fever, shortness of breath that has been ongoing for the last 5 days.  Patient states that he started noticing some abdominal pain that is worse in the epigastric region about 5 days ago.  He states that since then, is continued to hurt.  He has not had any associated nausea/vomiting.  He states he has been able to eat and drink without difficulty though he does feel like he had a slight decrease in appetite.  He also reports he has had some shortness of breath.  He states he feels like it is worse with exertion.  He does not feel he is having any wheezing or coughing.  He does not have any history of COPD or asthma.  Patient states that he had some intermittent chest pain but currently denies any chest pain right now.  He states that he does have some pain when he takes a deep breath in but states that is not the case today.  He reports he has been running fever and the highest fever measured was 103.  He states that he was tested for Covid at the beginning of symptoms which was negative.  Patient states that the pain continued to get worse, prompting ED visit.  Patient with history of unprovoked PE in 2018.  Patient is on Eliquis and states he has been taking his medications.  He denies any recent travel, known COVID-19 exposure.     The history is provided by the patient.    Past Medical History:  Diagnosis Date   AAA (abdominal aortic aneurysm) (HCC)    Arthritis    GERD (gastroesophageal reflux disease)    Hyperlipidemia    Hypertension    Lung nodule    TIA (transient ischemic attack)     Patient Active Problem  List   Diagnosis Date Noted   Pulmonary embolus ([place]) 04/07/2018   Lung nodule    Hypertension    Hyperlipidemia    GERD (gastroesophageal reflux disease)    Arthritis    TIA (transient ischemic attack)    Neck pain    SOB (shortness of breath)    History of colonic polyps    Encounter for screening colonoscopy 04/30/2014    Past Surgical History:  Procedure Laterality Date   APPENDECTOMY     BACK SURGERY     CHOLECYSTECTOMY     COLONOSCOPY N/A 05/05/2014   Procedure: COLONOSCOPY;  Surgeon: Daneil Dolin, MD;  Location: [facility];  Service: Endoscopy;  Laterality: N/A;  100   MULTIPLE TOOTH EXTRACTIONS          Home Medications    Prior to Admission medications   Medication Sig Start Date End Date Taking? Authorizing Provider  acetaminophen (TYLENOL) 325 MG tablet Take 2 tablets (650 mg total) by mouth every 6 (six) hours as needed for mild pain, fever or headache (or Fever >/= 101). 04/09/18  Yes Emokpae, Courage, MD  amLODipine (NORVASC) 10 MG tablet Take 5 mg by mouth daily.    Yes [provider]  apixaban (ELIQUIS) 5 MG TABS tablet Take 1  tablet (5 mg total) by mouth 2 (two) times daily. Start on 01/272020 after completing the starter pack 04/16/18  Yes Emokpae, Courage, MD  atorvastatin (LIPITOR) 40 MG tablet Take 40 mg by mouth daily.   Yes [provider]  Glucosamine-Chondroit-Vit C-Mn (GLUCOSAMINE 1500 COMPLEX PO) Take 1,500 mg by mouth daily.   Yes [provider]  ibuprofen (ADVIL) 200 MG tablet Take 400 mg by mouth every 6 (six) hours as needed.   Yes [provider]  losartan (COZAAR) 100 MG tablet Take 100 mg by mouth daily. 12/24/18  Yes [provider]  nebivolol (BYSTOLIC) 10 MG tablet Take 1 tablet (10 mg total) by mouth daily. 04/10/18  Yes Emokpae, Courage, MD  omeprazole (PRILOSEC) 20 MG capsule Take 20 mg by mouth daily.   Yes [provider]  tamsulosin (FLOMAX) 0.4 MG CAPS capsule  Take 0.4 mg by mouth at bedtime.    Yes [provider]  varenicline (CHANTIX) 0.5 MG tablet Take 0.5 mg by mouth 2 (two) times daily.   Yes [provider]  cephALEXin (KEFLEX) 500 MG capsule Take 1 capsule (500 mg total) by mouth 4 (four) times daily for 7 days. 03/16/19 03/23/19  Volanda Napoleon, PA-C  ELIQUIS STARTER PACK (ELIQUIS STARTER PACK) 5 MG TABS Take as directed on package: start with two-63m tablets twice daily for 7 days. On day 8, switch to one-572mtablet twice daily. Patient not taking: Reported on 03/16/2019 04/09/18   EmRoxan HockeyMD  methocarbamol (ROBAXIN) 500 MG tablet Take 1 tablet (500 mg total) by mouth 3 (three) times daily. Patient not taking: Reported on 03/16/2019 04/09/18   EmRoxan HockeyMD  oxyCODONE (OXY IR/ROXICODONE) 5 MG immediate release tablet Take 1 tablet (5 mg total) by mouth every 6 (six) hours as needed for moderate pain or severe pain. Patient not taking: Reported on 03/16/2019 04/09/18   EmRoxan HockeyMD  oxyCODONE-acetaminophen (PERCOCET/ROXICET) 5-325 MG tablet Take 1-2 tablets by mouth every 8 (eight) hours as needed for severe pain. 03/16/19   LaProvidence Lanius, PA-C  peg 3350 powder (MOVIPREP) 100 G SOLR Take 1 kit (200 g total) by mouth as directed. Patient not taking: Reported on 04/22/2014 06/25/13   RoDaneil DolinMD  senna-docusate (SENOKOT-S) 8.6-50 MG tablet Take 2 tablets by mouth 2 (two) times daily. Patient not taking: Reported on 03/16/2019 04/09/18   EmRoxan HockeyMD    Family History Family History  Problem Relation Age of Onset   Diabetes Mellitus II Mother    Kidney disease Mother    Lung cancer Sister     Social History Social History   Tobacco Use   Smoking status: Former Smoker    Packs/day: 0.50    Years: 62.00    Pack years: 31.00    Types: Cigarettes   Smokeless tobacco: Never Used   Tobacco comment: quit 2 months ago (October 2019)  Substance Use Topics   Alcohol use: No     Alcohol/week: 0.0 standard drinks   Drug use: No     Allergies   Oxycodone and Vioxx [rofecoxib]   Review of Systems Review of Systems  Constitutional: Negative for fever.  Respiratory: Positive for shortness of breath. Negative for cough.   Cardiovascular: Negative for chest pain and leg swelling.  Gastrointestinal: Positive for abdominal pain. Negative for nausea and vomiting.  Genitourinary: Negative for dysuria and hematuria.  Neurological: Negative for weakness, numbness and headaches.  All other systems reviewed and are negative.  Physical Exam Updated Vital Signs BP (!) 183/69    Pulse (!) 51    Temp (S) 99.6 F (37.6 C) (Rectal)    Resp 19    Ht 5' 5"  (1.651 m)    Wt 80.7 kg    SpO2 95%    BMI 29.62 kg/m   Physical Exam Vitals signs and nursing note reviewed.  Constitutional:      Appearance: Normal appearance. He is well-developed.  HENT:     Head: Normocephalic and atraumatic.  Eyes:     General: Lids are normal.     Conjunctiva/sclera: Conjunctivae normal.     Pupils: Pupils are equal, round, and reactive to light.  Neck:     Musculoskeletal: Full passive range of motion without pain.  Cardiovascular:     Rate and Rhythm: Normal rate and regular rhythm.     Pulses: Normal pulses.          Radial pulses are 2+ on the right side and 2+ on the left side.       Dorsalis pedis pulses are 2+ on the right side and 2+ on the left side.     Heart sounds: Normal heart sounds. No murmur. No friction rub. No gallop.   Pulmonary:     Effort: Pulmonary effort is normal.     Breath sounds: Normal breath sounds.     Comments: Lungs clear to auscultation bilaterally.  Symmetric chest rise.  No wheezing, rales, rhonchi. Abdominal:     Palpations: Abdomen is soft. Abdomen is not rigid.     Tenderness: There is generalized abdominal tenderness and tenderness in the epigastric area. There is no guarding.     Comments: Abdomen soft, nondistended.  Tenderness noted  particularly in the epigastric region but he also has some diffuse tenderness throughout.  Musculoskeletal: Normal range of motion.  Skin:    General: Skin is warm and dry.     Capillary Refill: Capillary refill takes less than 2 seconds.  Neurological:     Mental Status: He is alert and oriented to person, place, and time.  Psychiatric:        Speech: Speech normal.      ED Treatments / Results  Labs (all labs ordered are listed, but only abnormal results are displayed) Labs Reviewed  COMPREHENSIVE METABOLIC PANEL - Abnormal; Notable for the following components:      Result Value   Sodium 134 (*)    Potassium 3.1 (*)    Glucose, Bld 124 (*)    BUN 33 (*)    Creatinine, Ser 1.33 (*)    Calcium 8.4 (*)    Total Protein 6.3 (*)    Albumin 2.7 (*)    AST 46 (*)    ALT 60 (*)    GFR calc non Af Amer 51 (*)    GFR calc Af Amer 59 (*)    All other components within normal limits  CBC WITH DIFFERENTIAL/PLATELET - Abnormal; Notable for the following components:   RBC 3.49 (*)    Hemoglobin 10.8 (*)    HCT 31.5 (*)    Platelets 146 (*)    Monocytes Absolute 1.7 (*)    Abs Immature Granulocytes 0.12 (*)    All other components within normal limits  URINALYSIS, ROUTINE W REFLEX MICROSCOPIC - Abnormal; Notable for the following components:   Leukocytes,Ua SMALL (*)    All other components within normal limits  URINE CULTURE  LIPASE, BLOOD  LACTIC ACID, PLASMA  POC SARS  CORONAVIRUS 2 AG -  ED    EKG EKG Interpretation  Date/Time:  Sunday March 16 2019 12:28:13 EST Ventricular Rate:  55 PR Interval:    QRS Duration: 112 QT Interval:  480 QTC Calculation: 460 R Axis:   43 Text Interpretation: Sinus rhythm Nonspecific ST abnormality No significant change since last tracing Confirmed by Lajean Saver [phone]) on 03/16/2019 12:38:57 PM   Radiology Dg Chest Portable 1 View  Result Date: 03/16/2019 CLINICAL DATA:  Shortness of breath. Fever. EXAM: PORTABLE CHEST 1 VIEW  COMPARISON:  04/07/2018 FINDINGS: Cardiomediastinal contours remain moderately enlarged. Signs of calcified atherosclerosis in the thoracic aorta. Lungs are clear. No acute bone finding. IMPRESSION: Cardiomegaly without acute cardiopulmonary disease. Electronically Signed   By: Zetta Bills M.D.   On: 03/16/2019 13:20   Ct Angio Chest/abd/pel For Dissection W And/or Wo Contrast  Result Date: 03/16/2019 CLINICAL DATA:  Abdominal pain, fever, shortness of breath EXAM: CT ANGIOGRAPHY CHEST, ABDOMEN AND PELVIS TECHNIQUE: Multidetector CT imaging through the chest, abdomen and pelvis was performed using the standard protocol during bolus administration of intravenous contrast. Multiplanar reconstructed images and MIPs were obtained and reviewed to evaluate the vascular anatomy. CONTRAST:  18m OMNIPAQUE IOHEXOL 350 MG/ML SOLN COMPARISON:  04/07/2018 FINDINGS: CTA CHEST FINDINGS Cardiovascular: Mild cardiomegaly. No significant pericardial effusion. Dilated central pulmonary arteries. Satisfactory opacification of pulmonary arteries noted, and there is no evidence of pulmonary emboli. Moderate coronary calcifications. Adequate contrast opacification of the thoracic aorta with no evidence of dissection, aneurysm, or stenosis. There is classic 3-vessel brachiocephalic arch anatomy without proximal stenosis. Scattered calcified plaque in the arch and descending thoracic segment. Mediastinum/Nodes: Small hiatal hernia. 14 mm precarinal node. No hilar adenopathy. Lungs/Pleura: No pleural effusion. No pneumothorax. Mild septal thickening peripherally in both lungs suggesting mild interstitial edema. No confluent airspace disease. Musculoskeletal: No chest wall abnormality. No acute or significant osseous findings. Review of the MIP images confirms the above findings. CTA ABDOMEN AND PELVIS FINDINGS VASCULAR Aorta: Fusiform infrarenal aneurysm 4.5 x 4.1 cm maximum transverse dimensions (previously 3.8 cm on 11/02/2011),  tapering to normal caliber above the bifurcation. No leak or signs of impending rupture. No stenosis or dissection. Celiac: Partially calcified ostial plaque resulting in short segment mild stenosis, patent distally. SMA: Patent without evidence of aneurysm, dissection, vasculitis or significant stenosis. Renals: Single left, widely patent. Single right, with calcified ostial plaque resulting in short segment stenosis of possible hemodynamic significance, patent distally. IMA: Patent without evidence of aneurysm, dissection, vasculitis or significant stenosis. Arises proximal to the aneurysmal segment. Inflow: Scattered calcified plaque throughout the iliac arterial system without aneurysm, dissection, or stenosis. Visualized proximal bilateral lower extremity arterial outflow is mildly atheromatous but patent. Veins: No obvious venous abnormality within the limitations of this arterial phase study. Review of the MIP images confirms the above findings. NON-VASCULAR Hepatobiliary: No focal liver abnormality is seen. Status post cholecystectomy. No biliary dilatation. Pancreas: Unremarkable. No pancreatic ductal dilatation or surrounding inflammatory changes. Spleen: Normal in size without focal abnormality. Adrenals/Urinary Tract: Decreased enhancement of the left kidney compared to the right. The left kidney appears mildly enlarged, with some increase in mild surrounding inflammatory/edematous change. No hydronephrosis. Urinary bladder physiologically distended. Stomach/Bowel: Small hiatal hernia. Stomach is nondistended. Small bowel decompressed. Appendix not discretely identified. Colon is nondilated, unremarkable. Lymphatic: No abdominal or pelvic adenopathy. Reproductive: Prostate enlargement Other: No ascites. No free air. Musculoskeletal: Multilevel lumbar spondylitic changes most marked L5-S1. No fracture or worrisome bone lesion. Review of the MIP images  confirms the above findings. IMPRESSION: 1. Enlarged,  poorly enhancing left kidney with surrounding inflammatory/edematous change suggesting pyelonephritis. 2. 4.5 cm fusiform infrarenal abdominal aortic aneurysm (previously 3.8 cm on 14/48/1856 without complicating features. Recommend followup by abdomen and pelvis CTA in 6 months, and vascular surgery referral/consultation if not already obtained. This recommendation follows ACR consensus guidelines: White Paper of the ACR Incidental Findings Committee II on Vascular Findings. J Am Coll Radiol 2013; 10:789-794. 3. Mild septal thickening peripherally in both lungs suggesting mild interstitial edema. Coronary and aortic Atherosclerosis (ICD10-I70.0). Electronically Signed   By: Lucrezia Europe M.D.   On: 03/16/2019 15:16    Procedures Procedures (including critical care time)  Medications Ordered in ED Medications  oxyCODONE-acetaminophen (PERCOCET/ROXICET) 5-325 MG per tablet 1 tablet (1 tablet Oral Refused 03/16/19 1715)  morphine 4 MG/ML injection 4 mg (4 mg Intravenous Given 03/16/19 1233)  ondansetron (ZOFRAN) injection 4 mg (4 mg Intravenous Given 03/16/19 1232)  sodium chloride 0.9 % bolus 1,000 mL (0 mLs Intravenous Stopped 03/16/19 1444)  sodium chloride (PF) 0.9 % injection (  Given by Other 03/16/19 1442)  iohexol (OMNIPAQUE) 350 MG/ML injection 100 mL (100 mLs Intravenous Contrast Given 03/16/19 1451)  cefTRIAXone (ROCEPHIN) 2 g in sodium chloride 0.9 % 100 mL IVPB (0 g Intravenous Stopped 03/16/19 1707)  potassium chloride SA (KLOR-CON) CR tablet 40 mEq (40 mEq Oral Given 03/16/19 1715)     Initial Impression / Assessment and Plan / ED Course  I have reviewed the triage vital signs and the nursing notes.  Pertinent labs & imaging results that were available during my care of the patient were reviewed by me and considered in my medical decision making (see chart for details).        78 year old male who presents for evaluation of abdominal pain, shortness of breath, fever that is been ongoing  for 5 days.  Initial ED arrival, he is afebrile but is hypertensive.  Vitals otherwise stable.  Patient with tenderness into the epigastric region.  Concern for infectious process versus cardiac process.  He has a history of AAA.  Question of this is the source of his symptoms.  Additionally, he has a history of PE.  He does report he is on Eliquis but is having some pleuritic pain. History/Physical exam not concerning for ACS etiology.  We will plan to check labs, chest x-ray.  Additionally, given his aneurysm, plan for CTA of chest abdomen pelvis.  Covid negative.  CMP shows potassium of 3.1, BUN of 33, creatinine of 1.33.  CBC shows hemoglobin of 10.8.  No leukocytosis.  Lipase is unremarkable.  UA shows pyuria and small leukocytes.  Lipase is unremarkable.  CTA shows left kidney with inflammatory edematous changes suggesting pyelonephritis.  His aneurysm has increased from 3.8 to 4.5 cm.  No other acute findings noted.  Chest x-ray shows cardiomegaly.  No acute abnormalities.  Given concerns of possible pyelonephritis, patient started on Rocephin here in the emergency department.  Son is at bedside.  Originally, they had a death in the family.  I discussed with patient regarding his disposition.  I discussed at length with patient and son and engaged in shared decision making.  Patient reports he feels better.  He has been able to tolerate p.o. and has not any vomiting.  Repeat abdominal exam shows improvement in tenderness.  I discussed at length with both patient and son and after extensive conversations, patient would like to try oral by not antibiotics at home.  He  does not wish to be admitted at this time.  Son is in agreement and understands that if anything worsens, he will bring his dad back to the emergency department immediately. Discussed patient with Dr. Ashok Cordia who agrees with plan.  Patient stated to RN that he had allergy to oxycodone.  He states that it made him itch.  No rash, swelling  tongue or lips or breathing.  I discussed with patient at length.  He cannot remember if it is oxycodone or hydrocodone.  He has been on oxycodone in the past and has had recent prescription for it.  I discussed with him that we will still give him a short course if he has pain, he can take it.  If he is uncomfortable taking or makes him itch, he should not take it. Patient had ample opportunity for questions and discussion. All patient's questions were answered with full understanding. Strict return precautions discussed. Patient expresses understanding and agreement to plan.   Portions of this note were generated with Lobbyist. Dictation errors may occur despite best attempts at proofreading.   Final Clinical Impressions(s) / ED Diagnoses   Final diagnoses:  Pyelonephritis  Hypokalemia  Abdominal aortic aneurysm (AAA) 3.0 cm to 5.5 cm in diameter in male [facility])    ED Discharge Orders         Ordered    cephALEXin (KEFLEX) 500 MG capsule  4 times daily     03/16/19 1700    oxyCODONE-acetaminophen (PERCOCET/ROXICET) 5-325 MG tablet  Every 8 hours PRN     03/16/19 1700           Desma Mcgregor 03/16/19 2003    Lajean Saver, MD 03/17/19 1316

## 2019-03-16 NOTE — ED Notes (Signed)
Patient transported to CT 

## 2019-03-16 NOTE — Discharge Instructions (Signed)
Your work-up today did show evidence of urinary tract infection that has caused slight infection in the kidney which may be the source of your symptoms.  We have treated you with antibiotics.  Make sure you complete antibiotics.  Take pain medications as directed for break through pain. Do not drive or operate machinery while taking this medication.   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  This just needs follow-up by your primary care doctor and repeat ultrasound evaluation.  Your potassium was slightly low here in the emergency department.  I provided you with oral replacement.  As we discussed, closely monitor your symptoms.  Return to the emergency department for any worsening pain, vomiting, fever, difficulty breathing, chest pain or any other worsening concerning symptoms.

## 2019-03-18 LAB — URINE CULTURE
Culture: <10000 — AB
Special Requests: NORMAL

## 2019-05-20 ENCOUNTER — Emergency Department (HOSPITAL_COMMUNITY)
Admission: EM | Admit: 2019-05-20 | Discharge: 2019-05-20 | Disposition: A | Discharge status: Home / Self Care | Payer: Medicare Other | Attending: Emergency Medicine | Admitting: Emergency Medicine

## 2019-05-20 ENCOUNTER — Emergency Department (HOSPITAL_COMMUNITY): Payer: Medicare Other

## 2019-05-20 DIAGNOSIS — I714 Abdominal aortic aneurysm, without rupture: Secondary | ICD-10-CM | POA: Diagnosis not present

## 2019-05-20 DIAGNOSIS — R1032 Left lower quadrant pain: Secondary | ICD-10-CM | POA: Insufficient documentation

## 2019-05-20 DIAGNOSIS — Y9389 Activity, other specified: Secondary | ICD-10-CM | POA: Insufficient documentation

## 2019-05-20 DIAGNOSIS — Z86711 Personal history of pulmonary embolism: Secondary | ICD-10-CM | POA: Diagnosis not present

## 2019-05-20 DIAGNOSIS — Z79899 Other long term (current) drug therapy: Secondary | ICD-10-CM | POA: Insufficient documentation

## 2019-05-20 DIAGNOSIS — W010XXA Fall on same level from slipping, tripping and stumbling without subsequent striking against object, initial encounter: Secondary | ICD-10-CM | POA: Insufficient documentation

## 2019-05-20 DIAGNOSIS — Y999 Unspecified external cause status: Secondary | ICD-10-CM | POA: Insufficient documentation

## 2019-05-20 DIAGNOSIS — R519 Headache, unspecified: Secondary | ICD-10-CM | POA: Diagnosis present

## 2019-05-20 DIAGNOSIS — Y92099 Unspecified place in other non-institutional residence as the place of occurrence of the external cause: Secondary | ICD-10-CM | POA: Diagnosis not present

## 2019-05-20 DIAGNOSIS — R531 Weakness: Secondary | ICD-10-CM | POA: Insufficient documentation

## 2019-05-20 DIAGNOSIS — Z7901 Long term (current) use of anticoagulants: Secondary | ICD-10-CM | POA: Diagnosis not present

## 2019-05-20 DIAGNOSIS — I10 Essential (primary) hypertension: Secondary | ICD-10-CM | POA: Diagnosis not present

## 2019-05-20 DIAGNOSIS — R5383 Other fatigue: Secondary | ICD-10-CM | POA: Insufficient documentation

## 2019-05-20 DIAGNOSIS — W19XXXA Unspecified fall, initial encounter: Secondary | ICD-10-CM

## 2019-05-20 LAB — COMPREHENSIVE METABOLIC PANEL
ALT: 20 U/L (ref 0–44)
AST: 21 U/L (ref 15–41)
Albumin: 3.4 g/dL — ABNORMAL LOW (ref 3.5–5.0)
Alkaline Phosphatase: 60 U/L (ref 38–126)
Anion gap: 9 (ref 5–15)
BUN: 20 mg/dL (ref 8–23)
CO2: 26 mmol/L (ref 22–32)
Calcium: 9.3 mg/dL (ref 8.9–10.3)
Chloride: 106 mmol/L (ref 98–111)
Creatinine, Ser: 1.13 mg/dL (ref 0.61–1.24)
GFR calc Af Amer: >60 mL/min (ref >60)
GFR calc non Af Amer: >60 mL/min (ref >60)
Glucose, Bld: 141 mg/dL — ABNORMAL HIGH (ref 70–99)
Potassium: 3.8 mmol/L (ref 3.5–5.1)
Sodium: 141 mmol/L (ref 135–145)
Total Bilirubin: 0.8 mg/dL (ref 0.3–1.2)
Total Protein: 7.9 g/dL (ref 6.5–8.1)

## 2019-05-20 LAB — URINALYSIS, ROUTINE W REFLEX MICROSCOPIC
Bacteria, UA: NONE SEEN
Bilirubin Urine: NEGATIVE
Glucose, UA: NEGATIVE mg/dL
Hgb urine dipstick: NEGATIVE
Ketones, ur: NEGATIVE mg/dL
Leukocytes,Ua: NEGATIVE
Nitrite: NEGATIVE
Protein, ur: 30 mg/dL — AB
Specific Gravity, Urine: 1.013 (ref 1.005–1.030)
pH: 6 (ref 5.0–8.0)

## 2019-05-20 LAB — CBC
HCT: 37.7 % — ABNORMAL LOW (ref 39.0–52.0)
Hemoglobin: 12.2 g/dL — ABNORMAL LOW (ref 13.0–17.0)
MCH: 30.3 pg (ref 26.0–34.0)
MCHC: 32.4 g/dL (ref 30.0–36.0)
MCV: 93.8 fL (ref 80.0–100.0)
Platelets: 219 10*3/uL (ref 150–400)
RBC: 4.02 MIL/uL — ABNORMAL LOW (ref 4.22–5.81)
RDW: 13.3 % (ref 11.5–15.5)
WBC: 10.9 10*3/uL — ABNORMAL HIGH (ref 4.0–10.5)
nRBC: 0 % (ref 0.0–0.2)

## 2019-05-20 LAB — CBG MONITORING, ED: Glucose-Capillary: 147 mg/dL — ABNORMAL HIGH (ref 70–99)

## 2019-05-20 MED ORDER — MORPHINE SULFATE (PF) 4 MG/ML IV SOLN
4.0000 mg | Freq: Once | INTRAVENOUS | Status: AC
  Administered 2019-05-20: 4 mg via INTRAVENOUS
  Filled 2019-05-20: qty 1

## 2019-05-20 MED ORDER — ONDANSETRON HCL 4 MG/2ML IJ SOLN
4.0000 mg | Freq: Once | INTRAMUSCULAR | Status: AC
  Administered 2019-05-20: 4 mg via INTRAVENOUS
  Filled 2019-05-20: qty 2

## 2019-05-20 MED ORDER — DIPHENHYDRAMINE HCL 25 MG PO CAPS
25.0000 mg | ORAL_CAPSULE | Freq: Once | ORAL | Status: AC
  Administered 2019-05-20: 25 mg via ORAL
  Filled 2019-05-20: qty 1

## 2019-05-20 MED ORDER — LABETALOL HCL 5 MG/ML IV SOLN
5.0000 mg | Freq: Once | INTRAVENOUS | Status: AC
  Administered 2019-05-20: 5 mg via INTRAVENOUS

## 2019-05-20 MED ORDER — METOCLOPRAMIDE HCL 5 MG/ML IJ SOLN
10.0000 mg | Freq: Once | INTRAMUSCULAR | Status: AC
  Administered 2019-05-20: 16:00:00 10 mg via INTRAVENOUS
  Filled 2019-05-20: qty 2

## 2019-05-20 NOTE — ED Triage Notes (Signed)
Pt bib ems from home with progressive weakness and headaches. Hx sepsis in December. Pt also c/o R flank pain. Pt did fall yesterday and struck his head. Pt on eliquis. Per family pt with frequent falls. A&OX4 with ems.  220/100 HR 60 RR 16 99% RA CBG 153

## 2019-05-20 NOTE — Discharge Instructions (Addendum)
Please continue take your blood pressure medications as prescribed.  Please continue take your Eliquis.  Please call of our neurology to make an appointment with them for evaluation of your headaches.

## 2019-05-20 NOTE — ED Provider Notes (Signed)
Care assumed from [facility], PA-C at shift change. See his note for full H&P.   Per his note, "Patient is a 79 year old male with history of HTN, HLD, arthritis, TIA, AAA  Patient brought in from home by EMS for weakness and headaches for the past 2 months.  Patient was hospitalized for sepsis in December and states that he has not felt the same since.  He is felt more fatigued and also had persistent headaches.  Patient states that for the past month or so he has also has some blurry vision bilaterally however he states that his vision is bad at baseline.  He does wear glasses which he has with him today.  Patient states that yesterday he fell and hit the back of his head on the wooden floor of the back of his house.  He states he did not lose consciousness but able to stand up and walk afterwards and denies any confusion, altered mental status, slurred speech, weakness or numbness.  He states that he came in today more because of his chronic headaches because of his fall.  He states that he has been on Eliquis for 1 to 2 months after he was found to have a PE after his hospitalization in December.  States that he takes his medications as prescribed.  States that he took his blood pressure medications this morning.  He describes his headache as severe and achy it is present more than is absent but is intermittent with sometimes 1 to 2 hours with relief.  He states that he uses Tylenol without any improvement in symptoms.  Denies any fevers, neck pain, neck stiffness.  Discussed with patient's wife over the phone who states that patient has just not been the same since his hospitalization.  She states that he has had no sudden changes in mentation or abilities but seems to just be less mobile time difficulty standing and walking progressively over the past couple months.  She states that he has been complaining of headaches as well.  Additionally patient states that he has had some left flank  pain for approximately 1 year that occurs when he urinates but not at any other time."   Physical Exam  BP (!) 175/71   Pulse (!) 59   Temp 98 F (36.7 C) (Oral)   Resp 18   SpO2 100%   Physical Exam Constitutional:      General: He is not in acute distress.    Appearance: He is well-developed.  Eyes:     Conjunctiva/sclera: Conjunctivae normal.  Cardiovascular:     Rate and Rhythm: Normal rate.  Pulmonary:     Effort: Pulmonary effort is normal.  Abdominal:     General: Abdomen is flat.  Skin:    General: Skin is warm and dry.  Neurological:     Mental Status: He is alert and oriented to person, place, and time.     Comments: Moving all extremities, clear speech     ED Course/Procedures     Procedures Results for orders placed or performed during the hospital encounter of 05/20/19  Comprehensive metabolic panel  Result Value Ref Range   Sodium 141 135 - 145 mmol/L   Potassium 3.8 3.5 - 5.1 mmol/L   Chloride 106 98 - 111 mmol/L   CO2 26 22 - 32 mmol/L   Glucose, Bld 141 (H) 70 - 99 mg/dL   BUN 20 8 - 23 mg/dL   Creatinine, Ser 1.13 0.61 - 1.24 mg/dL  Calcium 9.3 8.9 - 10.3 mg/dL   Total Protein 7.9 6.5 - 8.1 g/dL   Albumin 3.4 (L) 3.5 - 5.0 g/dL   AST 21 15 - 41 U/L   ALT 20 0 - 44 U/L   Alkaline Phosphatase 60 38 - 126 U/L   Total Bilirubin 0.8 0.3 - 1.2 mg/dL   GFR calc non Af Amer >60 >60 mL/min   GFR calc Af Amer >60 >60 mL/min   Anion gap 9 5 - 15  CBC  Result Value Ref Range   WBC 10.9 (H) 4.0 - 10.5 K/uL   RBC 4.02 (L) 4.22 - 5.81 MIL/uL   Hemoglobin 12.2 (L) 13.0 - 17.0 g/dL   HCT 37.7 (L) 39.0 - 52.0 %   MCV 93.8 80.0 - 100.0 fL   MCH 30.3 26.0 - 34.0 pg   MCHC 32.4 30.0 - 36.0 g/dL   RDW 13.3 11.5 - 15.5 %   Platelets 219 150 - 400 K/uL   nRBC 0.0 0.0 - 0.2 %  Urinalysis, Routine w reflex microscopic  Result Value Ref Range   Color, Urine YELLOW YELLOW   APPearance CLEAR CLEAR   Specific Gravity, Urine 1.013 1.005 - 1.030   pH 6.0 5.0  - 8.0   Glucose, UA NEGATIVE NEGATIVE mg/dL   Hgb urine dipstick NEGATIVE NEGATIVE   Bilirubin Urine NEGATIVE NEGATIVE   Ketones, ur NEGATIVE NEGATIVE mg/dL   Protein, ur 30 (A) NEGATIVE mg/dL   Nitrite NEGATIVE NEGATIVE   Leukocytes,Ua NEGATIVE NEGATIVE   RBC / HPF 0-5 0 - 5 RBC/hpf   WBC, UA 0-5 0 - 5 WBC/hpf   Bacteria, UA NONE SEEN NONE SEEN   Mucus PRESENT   CBG monitoring, ED  Result Value Ref Range   Glucose-Capillary 147 (H) 70 - 99 mg/dL   CT Head Wo Contrast  Result Date: 05/20/2019 CLINICAL DATA:  79 year old male with fall and head injury. EXAM: CT HEAD WITHOUT CONTRAST TECHNIQUE: Contiguous axial images were obtained from the base of the skull through the vertex without intravenous contrast. COMPARISON:  Head CT dated 04/08/2018. FINDINGS: Brain: There is moderate age-related atrophy and chronic microvascular ischemic changes. Bilateral old basal ganglia lacunar infarcts noted. There is no acute intracranial hemorrhage. No mass effect or midline shift. No extra-axial fluid collection. Vascular: No hyperdense vessel or unexpected calcification. Skull: Normal. Negative for fracture or focal lesion. Sinuses/Orbits: No acute finding. Other: None IMPRESSION: 1. No acute intracranial hemorrhage. 2. Age-related atrophy and chronic microvascular ischemic changes. 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 Electronically Signed   By: Anner Crete M.D.   On: 05/20/2019 17:19    MDM   79 y/o M presenting for eval of weakness and headaches ongoing for several months. Fell yesterday and hit head. He is anticoagulated.  At shift change, pending CT head and UA. If w/u neg plan for d/c home with neuro f/u for headaches.  VS reassuring. Exam benign  Reviewed labs, CBC with mild leukocytosis, anemia present but stable CMP without acute electrolyte derangement, normal kidney and liver function UA w/o evidence for UTI  CT head 1. No acute intracranial hemorrhage. 2. Age-related  atrophy and chronic microvascular ischemic changes. 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    Pt w/u is reassuring. He is in no acute distress. Advised f/u with pcp and strict return precautions. He voices understanding and is in agreement. All questions answered, pt stable for d/c.    Tivis Ringer, Akeylah Hendel S, PA-C 05/20/19 2240    Steinl,  Lennette Bihari, MD 05/20/19 2332

## 2019-05-20 NOTE — ED Notes (Signed)
This tech walked pt to bathroom. Pt was unable to give urine sample and unable to urinate at that time. Pt is aware that a urine sample is needed. Will try again later.

## 2019-05-20 NOTE — ED Provider Notes (Signed)
[facility] Provider Note   CSN: [id number] Arrival date & time: 05/20/19  1326     History Chief Complaint  Patient presents with  . Weakness  . Fall  . Flank Pain    [facility] is a 79 y.o. male.  HPI  Patient is a 79 year old male with history of HTN, HLD, arthritis, TIA, AAA  Patient brought in from home by EMS for weakness and headaches for the past 2 months.  Patient was hospitalized for sepsis in December and states that he has not felt the same since.  He is felt more fatigued and also had persistent headaches.  Patient states that for the past month or so he has also has some blurry vision bilaterally however he states that his vision is bad at baseline.  He does wear glasses which he has with him today.  Patient states that yesterday he fell and hit the back of his head on the wooden floor of the back of his house.  He states he did not lose consciousness but able to stand up and walk afterwards and denies any confusion, altered mental status, slurred speech, weakness or numbness.  He states that he came in today more because of his chronic headaches because of his fall.  He states that he has been on Eliquis for 1 to 2 months after he was found to have a PE after his hospitalization in December.  States that he takes his medications as prescribed.  States that he took his blood pressure medications this morning.  He describes his headache as severe and achy it is present more than is absent but is intermittent with sometimes 1 to 2 hours with relief.  He states that he uses Tylenol without any improvement in symptoms.  Denies any fevers, neck pain, neck stiffness.  Discussed with patient's wife over the phone who states that patient has just not been the same since his hospitalization.  She states that he has had no sudden changes in mentation or abilities but seems to just be less mobile time difficulty standing and walking  progressively over the past couple months.  She states that he has been complaining of headaches as well.  Additionally patient states that he has had some left flank pain for approximately 1 year that occurs when he urinates but not at any other time.     Past Medical History:  Diagnosis Date  . AAA (abdominal aortic aneurysm) ([place])   . Arthritis   . GERD (gastroesophageal reflux disease)   . Hyperlipidemia   . Hypertension   . Lung nodule   . TIA (transient ischemic attack)     Patient Active Problem List   Diagnosis Date Noted  . Pulmonary embolus ([place]) 04/07/2018  . Lung nodule   . Hypertension   . Hyperlipidemia   . GERD (gastroesophageal reflux disease)   . Arthritis   . TIA (transient ischemic attack)   . Neck pain   . SOB (shortness of breath)   . History of colonic polyps   . Encounter for screening colonoscopy 04/30/2014    Past Surgical History:  Procedure Laterality Date  . APPENDECTOMY    . BACK SURGERY    . CHOLECYSTECTOMY    . COLONOSCOPY N/A 05/05/2014   Procedure: COLONOSCOPY;  Surgeon: Daneil Dolin, MD;  Location: [facility];  Service: Endoscopy;  Laterality: N/A;  100  . MULTIPLE TOOTH EXTRACTIONS  Family History  Problem Relation Age of Onset  . Diabetes Mellitus II Mother   . Kidney disease Mother   . Lung cancer Sister     Social History   Tobacco Use  . Smoking status: Former Smoker    Packs/day: 0.50    Years: 62.00    Pack years: 31.00    Types: Cigarettes  . Smokeless tobacco: Never Used  . Tobacco comment: quit 2 months ago (October 2019)  Substance Use Topics  . Alcohol use: No    Alcohol/week: 0.0 standard drinks  . Drug use: No    Home Medications Prior to Admission medications   Medication Sig Start Date End Date Taking? Authorizing Provider  acetaminophen (TYLENOL) 325 MG tablet Take 2 tablets (650 mg total) by mouth every 6 (six) hours as needed for mild pain, fever or headache (or Fever >/= 101).  04/09/18   Roxan Hockey, MD  amLODipine (NORVASC) 10 MG tablet Take 5 mg by mouth daily.     [provider]  apixaban (ELIQUIS) 5 MG TABS tablet Take 1 tablet (5 mg total) by mouth 2 (two) times daily. Start on 01/272020 after completing the starter pack 04/16/18   Roxan Hockey, MD  atorvastatin (LIPITOR) 40 MG tablet Take 40 mg by mouth daily.    [provider]  ELIQUIS STARTER PACK (ELIQUIS STARTER PACK) 5 MG TABS Take as directed on package: start with two-13m tablets twice daily for 7 days. On day 8, switch to one-538mtablet twice daily. Patient not taking: Reported on 03/16/2019 04/09/18   EmRoxan HockeyMD  Glucosamine-Chondroit-Vit C-Mn (GLUCOSAMINE 1500 COMPLEX PO) Take 1,500 mg by mouth daily.    [provider]  ibuprofen (ADVIL) 200 MG tablet Take 400 mg by mouth every 6 (six) hours as needed.    [provider]  losartan (COZAAR) 100 MG tablet Take 100 mg by mouth daily. 12/24/18   [provider]  methocarbamol (ROBAXIN) 500 MG tablet Take 1 tablet (500 mg total) by mouth 3 (three) times daily. Patient not taking: Reported on 03/16/2019 04/09/18   EmRoxan HockeyMD  nebivolol (BYSTOLIC) 10 MG tablet Take 1 tablet (10 mg total) by mouth daily. 04/10/18   EmRoxan HockeyMD  omeprazole (PRILOSEC) 20 MG capsule Take 20 mg by mouth daily.    [provider]  oxyCODONE (OXY IR/ROXICODONE) 5 MG immediate release tablet Take 1 tablet (5 mg total) by mouth every 6 (six) hours as needed for moderate pain or severe pain. Patient not taking: Reported on 03/16/2019 04/09/18   EmRoxan HockeyMD  oxyCODONE-acetaminophen (PERCOCET/ROXICET) 5-325 MG tablet Take 1-2 tablets by mouth every 8 (eight) hours as needed for severe pain. 03/16/19   [place] Lanius, PA-C  peg 3350 powder (MOVIPREP) 100 G SOLR Take 1 kit (200 g total) by mouth as directed. Patient not taking: Reported on 04/22/2014 06/25/13   RoDaneil DolinMD  senna-docusate  (SENOKOT-S) 8.6-50 MG tablet Take 2 tablets by mouth 2 (two) times daily. Patient not taking: Reported on 03/16/2019 04/09/18   EmRoxan HockeyMD  tamsulosin (FLOMAX) 0.4 MG CAPS capsule Take 0.4 mg by mouth at bedtime.     [provider]  varenicline (CHANTIX) 0.5 MG tablet Take 0.5 mg by mouth 2 (two) times daily.    [provider]    Allergies    Oxycodone and Vioxx [rofecoxib]  Review of Systems   Review of Systems  Constitutional: Negative for chills and fever.  HENT: Negative  for congestion.   Eyes: Positive for visual disturbance. Negative for pain.  Respiratory: Negative for cough and shortness of breath.   Cardiovascular: Negative for chest pain and leg swelling.  Gastrointestinal: Negative for abdominal pain and vomiting.  Genitourinary: Positive for flank pain. Negative for dysuria.  Musculoskeletal: Negative for myalgias.  Skin: Negative for rash.  Neurological: Positive for headaches. Negative for dizziness.    Physical Exam Updated Vital Signs BP (!) 180/102 (BP Location: Right Arm)   Pulse 60   Temp 98 F (36.7 C) (Oral)   Resp 16   SpO2 98%   Physical Exam Vitals and nursing note reviewed.  Constitutional:      General: He is not in acute distress.    Comments: Overweight 79 year old male appears stated age.  Is pleasant but hard of hearing.  HENT:     Head: Normocephalic and atraumatic.     Nose: Nose normal.     Mouth/Throat:     Mouth: Mucous membranes are moist.  Eyes:     General: No scleral icterus. Cardiovascular:     Rate and Rhythm: Normal rate and regular rhythm.     Pulses: Normal pulses.     Heart sounds: Normal heart sounds.  Pulmonary:     Effort: Pulmonary effort is normal. No respiratory distress.     Breath sounds: No wheezing.  Abdominal:     Palpations: Abdomen is soft.     Tenderness: There is no abdominal tenderness.     Hernia: A hernia is present.     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 No focal tenderness on abdominal exam.  There is generalized tenderness with deep palpation.  Negative Rovsing, negative McBurney, negative Murphy.  Negative psoas and obturator.  Abdomen is protuberant.  Musculoskeletal:     Cervical back: Normal range of motion.     Right lower leg: No edema.     Left lower leg: No edema.  Skin:    General: Skin is warm and dry.     Capillary Refill: Capillary refill takes less than 2 seconds.  Neurological:     Mental Status: He is alert. Mental status is at baseline.     Comments: Alert and oriented to self, place, time and event.   Speech is fluent, clear without dysarthria or dysphasia.   Strength 5/5 in upper/lower extremities  Sensation intact in upper/lower extremities   Normal gait.  Negative Romberg. No pronator drift.  Normal finger-to-nose and feet tapping.  CN I not tested  CN II grossly intact visual fields bilaterally. Did not visualize posterior eye.   CN III, IV, VI PERRLA and EOMs intact bilaterally  CN V Intact sensation to sharp and light touch to the face  CN VII facial movements symmetric  CN VIII not tested  CN IX, X no uvula deviation, symmetric rise of soft palate  CN XI 5/5 SCM and trapezius strength bilaterally  CN XII Midline tongue protrusion, symmetric L/R movements   Psychiatric:        Mood and Affect: Mood normal.        Behavior: Behavior normal.     ED Results / Procedures / Treatments   Labs (all labs ordered are listed, but only abnormal results are displayed) Labs Reviewed  CBC - Abnormal; Notable for the following components:      Result Value   WBC 10.9 (*)    RBC 4.02 (*)    Hemoglobin 12.2 (*)  HCT 37.7 (*)    All other components within normal limits  CBG MONITORING, ED - Abnormal; Notable for the following components:   Glucose-Capillary 147 (*)    All other components within normal limits  COMPREHENSIVE METABOLIC PANEL  URINALYSIS, ROUTINE W REFLEX  MICROSCOPIC    EKG None  Radiology No results found.  Procedures Procedures (including critical care time)  Medications Ordered in ED Medications  metoCLOPramide (REGLAN) injection 10 mg (has no administration in time range)  diphenhydrAMINE (BENADRYL) capsule 25 mg (has no administration in time range)    ED Course  I have reviewed the triage vital signs and the nursing notes.  Pertinent labs & imaging results that were available during my care of the patient were reviewed by me and considered in my medical decision making (see chart for details).    MDM Rules/Calculators/A&P                      Patient 79 year old male history of HTN, HLD, AAA presented today with generalized fatigue as well as headaches for the past 2 months.  Patient was recently hospitalized and states he has been declining in health since.  Patient is neurologically intact and has reassuring physical exam.  He does have large ventral hernia but no abnormalities on abdominal exam no chest pain shortness of breath.  He does endorse flank pain is appears to have been going on for over a 1 year.  Will obtain urinalysis to evaluate.  Will obtain CBC and CMP to evaluate for anemia and electrolyte abnormalities.  UA will be obtained due to patient's history of urosepsis and current fatigue however low suspicion for urosepsis today as he is not tachycardic or hyper thermic.  CT of head obtained due to patient's recent fall on Eliquis.  No cough, cold, congestion, body aches to suggest viral illness/Covid.  No heat or cold intolerance or skin changes to indicate thyroid disease.  Patient provided Reglan and Benadryl while waiting for head CT to ameliorate his headache symptoms.  Patient care transferred to [place]. PA-C at 3:42 PM who will follow up on labs/CT imaging and appropriately disposition. I expect to find no acute abnormalities and expect to DC patient home with neurology follow up. I have discussed with  wife and patient my expectations.   Final Clinical Impression(s) / ED Diagnoses Final diagnoses:  Fall, initial encounter  Nonintractable headache, unspecified chronicity pattern, unspecified headache type    Rx / DC Orders ED Discharge Orders    None       Tedd Sias, [place] 05/20/19 1543    Pattricia Boss, MD 05/20/19 [phone]

## 2019-05-21 ENCOUNTER — Encounter: Payer: Self-pay | Admitting: Neurology

## 2019-06-30 NOTE — Progress Notes (Addendum)
NEUROLOGY CONSULTATION NOTE  Andrew Hunt MRN: [id number] DOB: [birth year]  Referring provider: Coral Ceo, PA-C Primary care provider: Anastasia Pall, MD  Reason for consult:  Headache, fall  HISTORY OF PRESENT ILLNESS: Andrew Hunt is a 79 year old male with pulmonary embolism on Eliquis, HTN, HLD, arthritis, AAA and history of TIA who presents for headache and fall.  History supplemented by ED and hospital notes.  He was in the hospital in December for cystitis and sepsis.  Following discharge, he had not been feeling well.  He developed a severe non-throbbing occipital headache.  He noted that if he rubs the left side of his face, it feels like his skin is numb with a "crawling" burning feeling.  Otherwise, no severe stabbing/shooting pain and no noticeable facial droop.  He was also drooling from left side of his mouth.  He has some baseline poor vision but reports worsening blurred vision.  On 05/19/2019, he tripped and fell, hitting the back of his head on the hardwood floor.  He did not lose consciousness and did not have confusion, slurred speech, nausea, vomiting, weakness or numbness.  He went to the ED the following day for evaluation.  CT head personally reviewed showed no acute intracranial abnormality.  Labs, including CBC, CMP and UA, showed stable anemia but no evidence of infection or electrolyte derangement.  Headaches had resolved about 5 days later.  He still reports altered sensation on left side of face (V2-V3).  No neck pain but a prior CT of cervical spine from 04/07/2018 personally reviewed showed spondylosis .   Current NSAIDs:  ibuprofen Current analgesics:  Tylenol; Percocet Current antihypertensive:  Bystolic; losartan; amlodipine Other medications:  Eliquis  Past muscle relaxant:  Robaxin  PAST MEDICAL HISTORY: Past Medical History:  Diagnosis Date  . AAA (abdominal aortic aneurysm) ([place])   . Arthritis   . GERD (gastroesophageal reflux disease)     . Hyperlipidemia   . Hypertension   . Lung nodule   . TIA (transient ischemic attack)     PAST SURGICAL HISTORY: Past Surgical History:  Procedure Laterality Date  . APPENDECTOMY    . BACK SURGERY    . CHOLECYSTECTOMY    . COLONOSCOPY N/A 05/05/2014   Procedure: COLONOSCOPY;  Surgeon: Daneil Dolin, MD;  Location: [facility];  Service: Endoscopy;  Laterality: N/A;  100  . MULTIPLE TOOTH EXTRACTIONS      MEDICATIONS: Current Outpatient Medications on File Prior to Visit  Medication Sig Dispense Refill  . acetaminophen (TYLENOL) 325 MG tablet Take 2 tablets (650 mg total) by mouth every 6 (six) hours as needed for mild pain, fever or headache (or Fever >/= 101). 30 tablet 1  . amLODipine (NORVASC) 10 MG tablet Take 5 mg by mouth daily.     Marland Kitchen apixaban (ELIQUIS) 5 MG TABS tablet Take 1 tablet (5 mg total) by mouth 2 (two) times daily. Start on 01/272020 after completing the starter pack 60 tablet 2  . atorvastatin (LIPITOR) 40 MG tablet Take 40 mg by mouth daily.    Marland Kitchen ELIQUIS STARTER PACK (ELIQUIS STARTER PACK) 5 MG TABS Take as directed on package: start with two-23m tablets twice daily for 7 days. On day 8, switch to one-544mtablet twice daily. (Patient not taking: Reported on 03/16/2019) 1 each 0  . Glucosamine-Chondroit-Vit C-Mn (GLUCOSAMINE 1500 COMPLEX PO) Take 1,500 mg by mouth daily.    . Marland Kitchenbuprofen (ADVIL) 200 MG tablet Take 400 mg by mouth every 6 (  six) hours as needed.    Marland Kitchen losartan (COZAAR) 100 MG tablet Take 100 mg by mouth daily.    . methocarbamol (ROBAXIN) 500 MG tablet Take 1 tablet (500 mg total) by mouth 3 (three) times daily. (Patient not taking: Reported on 03/16/2019) 90 tablet 1  . nebivolol (BYSTOLIC) 10 MG tablet Take 1 tablet (10 mg total) by mouth daily. 30 tablet 5  . omeprazole (PRILOSEC) 20 MG capsule Take 20 mg by mouth daily.    Marland Kitchen oxyCODONE (OXY IR/ROXICODONE) 5 MG immediate release tablet Take 1 tablet (5 mg total) by mouth every 6 (six) hours as needed  for moderate pain or severe pain. (Patient not taking: Reported on 03/16/2019) 15 tablet 0  . oxyCODONE-acetaminophen (PERCOCET/ROXICET) 5-325 MG tablet Take 1-2 tablets by mouth every 8 (eight) hours as needed for severe pain. 8 tablet 0  . peg 3350 powder (MOVIPREP) 100 G SOLR Take 1 kit (200 g total) by mouth as directed. (Patient not taking: Reported on 04/22/2014) 1 kit 0  . senna-docusate (SENOKOT-S) 8.6-50 MG tablet Take 2 tablets by mouth 2 (two) times daily. (Patient not taking: Reported on 03/16/2019) 120 tablet 2  . tamsulosin (FLOMAX) 0.4 MG CAPS capsule Take 0.4 mg by mouth at bedtime.     . varenicline (CHANTIX) 0.5 MG tablet Take 0.5 mg by mouth 2 (two) times daily.     No current facility-administered medications on file prior to visit.    ALLERGIES: Allergies  Allergen Reactions  . Oxycodone Itching  . Vioxx [Rofecoxib] Swelling and Other (See Comments)    Caused mini-strokes    FAMILY HISTORY: Family History  Problem Relation Age of Onset  . Diabetes Mellitus II Mother   . Kidney disease Mother   . Lung cancer Sister    SOCIAL HISTORY: Social History   Socioeconomic History  . Marital status: Married    Spouse name: Not on file  . Number of children: Not on file  . Years of education: Not on file  . Highest education level: Not on file  Occupational History  . Not on file  Tobacco Use  . Smoking status: Former Smoker    Packs/day: 0.50    Years: 62.00    Pack years: 31.00    Types: Cigarettes  . Smokeless tobacco: Never Used  . Tobacco comment: quit 2 months ago (October 2019)  Substance and Sexual Activity  . Alcohol use: No    Alcohol/week: 0.0 standard drinks  . Drug use: No  . Sexual activity: Not Currently  Other Topics Concern  . Not on file  Social History Narrative  . Not on file   Social Determinants of Health   Financial Resource Strain:   . Difficulty of Paying Living Expenses:   Food Insecurity:   . Worried About Sales executive in the Last Year:   . Arboriculturist in the Last Year:   Transportation Needs:   . Film/video editor (Medical):   Marland Kitchen Lack of Transportation (Non-Medical):   Physical Activity:   . Days of Exercise per Week:   . Minutes of Exercise per Session:   Stress:   . Feeling of Stress :   Social Connections:   . Frequency of Communication with Friends and Family:   . Frequency of Social Gatherings with Friends and Family:   . Attends Religious Services:   . Active Member of Clubs or Organizations:   . Attends Archivist Meetings:   Marland Kitchen Marital  Status:   Intimate Partner Violence:   . Fear of Current or Ex-Partner:   . Emotionally Abused:   Marland Kitchen Physically Abused:   . Sexually Abused:      PHYSICAL EXAM: Blood pressure (!) 179/74, pulse 60, height 5' 6"  (1.676 m), weight 168 lb (76.2 kg), SpO2 94 %. General: No acute distress.  Patient appears well-groomed.   Head:  Normocephalic/atraumatic Eyes:  fundi examined but not visualized Neck: supple, no paraspinal tenderness, full range of motion Back: No paraspinal tenderness Heart: regular rate and rhythm Lungs: Clear to auscultation bilaterally. Vascular: No carotid bruits. Neurological Exam: Mental status: alert and oriented to person, place, and time, recent and remote memory reduced, fund of knowledge intact, attention and concentration intact, speech fluent and not dysarthric, language intact. Cranial nerves: CN I: not tested CN II: pupils equal, round and reactive to light, visual fields intact CN III, IV, VI:  full range of motion, no nystagmus, no ptosis CN V: hyperesthesia of left V2-V3 CN VII: upper and lower face symmetric CN VIII: hearing intact CN IX, X: gag intact, uvula midline CN XI: sternocleidomastoid and trapezius muscles intact CN XII: tongue midline Bulk & Tone: normal, no fasciculations. Motor:  5/5 throughout  Sensation:  Pinprick and vibration sensation intact. Deep Tendon Reflexes:  2+  throughout, toes downgoing.  Finger to nose testing:  Without dysmetria.  Heel to shin:  Without dysmetria.  Gait:  Normal station and stride.  Able to turn.  Romberg negative.  IMPRESSION: 1.  Occipital headache, resolved.  Unclear if may have been cervicogenic in nature. 2.  Left sided facial numbness.  Unclear if he had a small stroke 3.  Hypertension 4.  Hyperlipidemia 5.  History of pulmonary embolism on Eliquis  PLAN: 1.  I would like to check a carotid doppler to evaluate for any significant right ICA stenosis.   2.  Otherwise, continue medical management of stroke risk factors:    On Eliquis  Statin therapy  Optimize blood pressure control (follow up with PCP regarding elevated blood pressure) 3.  Follow up in 4 months.  Thank you for allowing me to take part in the care of this patient.  Metta Clines, DO  CC:  Anastasia Pall, MD

## 2019-07-03 ENCOUNTER — Ambulatory Visit (INDEPENDENT_AMBULATORY_CARE_PROVIDER_SITE_OTHER): Payer: Medicare Other | Admitting: Neurology

## 2019-07-03 ENCOUNTER — Other Ambulatory Visit: Payer: Self-pay

## 2019-07-03 ENCOUNTER — Encounter: Payer: Self-pay | Admitting: Neurology

## 2019-07-03 VITALS — BP 179/74 | HR 60 | Ht 66.0 in | Wt 168.0 lb

## 2019-07-03 DIAGNOSIS — G459 Transient cerebral ischemic attack, unspecified: Secondary | ICD-10-CM

## 2019-07-03 DIAGNOSIS — E7800 Pure hypercholesterolemia, unspecified: Secondary | ICD-10-CM

## 2019-07-03 DIAGNOSIS — R519 Headache, unspecified: Secondary | ICD-10-CM

## 2019-07-03 DIAGNOSIS — R200 Anesthesia of skin: Secondary | ICD-10-CM | POA: Diagnosis not present

## 2019-07-03 DIAGNOSIS — I2693 Single subsegmental pulmonary embolism without acute cor pulmonale: Secondary | ICD-10-CM

## 2019-07-03 DIAGNOSIS — I10 Essential (primary) hypertension: Secondary | ICD-10-CM

## 2019-07-03 NOTE — Patient Instructions (Addendum)
Unclear if you had a TIA (or mini stroke).  We will check a carotid ultrasound.  Otherwise, continue Eliquis, statin medication and blood pressure control.  Follow up in 4 months.  We have sent a referral to [facility] for your ultrasound  and they will call you directly to schedule your appointment. They are located at [place]. If you need to contact them directly please call [phone].

## 2019-07-17 ENCOUNTER — Ambulatory Visit
Admission: RE | Admit: 2019-07-17 | Discharge: 2019-07-17 | Disposition: A | Discharge status: Home / Self Care | Payer: Medicare Other | Source: Ambulatory Visit | Attending: Neurology | Admitting: Neurology

## 2019-07-17 DIAGNOSIS — R200 Anesthesia of skin: Secondary | ICD-10-CM

## 2019-07-17 DIAGNOSIS — G459 Transient cerebral ischemic attack, unspecified: Secondary | ICD-10-CM

## 2019-07-18 ENCOUNTER — Telehealth: Payer: Self-pay

## 2019-07-18 NOTE — Telephone Encounter (Signed)
-----   Message from Pieter Partridge, DO sent at 07/18/2019  7:16 AM EDT ----- Carotid ultrasound is overall unremarkable

## 2019-07-18 NOTE — Telephone Encounter (Signed)
Patient informed of Ultrasound results.

## 2019-11-03 NOTE — Progress Notes (Deleted)
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  ***  HISTORY: He was in the hospital in December for cystitis and sepsis.  Following discharge, he had not been feeling well.  He developed a severe non-throbbing occipital headache.  He noted that if he rubs the left side of his face, it feels like his skin is numb with a "crawling" burning feeling.  Otherwise, no severe stabbing/shooting pain and no noticeable facial droop.  He was also drooling from left side of his mouth.  He has some baseline poor vision but reports worsening blurred vision.  On 05/19/2019, he tripped and fell, hitting the back of his head on the hardwood floor.  He did not lose consciousness and did not have confusion, slurred speech, nausea, vomiting, weakness or numbness.  He went to the ED the following day for evaluation.  CT head personally reviewed showed no acute intracranial abnormality.  Labs, including CBC, CMP and UA, showed stable anemia but no evidence of infection or electrolyte derangement.  Headaches had resolved about 5 days later.  He still reports altered sensation on left side of face (V2-V3).  No neck pain but a prior CT of cervical spine from 04/07/2018 personally reviewed showed spondylosis .   Current NSAIDs:  ibuprofen Current analgesics:  Tylenol; Percocet Current antihypertensive:  Bystolic; losartan; amlodipine Other medications:  Eliquis  Past muscle relaxant:  Robaxin   PAST MEDICAL HISTORY: Past Medical History:  Diagnosis Date  . AAA (abdominal aortic aneurysm) ([place])   . Arthritis   . GERD (gastroesophageal reflux  disease)   . Hyperlipidemia   . Hypertension   . Lung nodule   . TIA (transient ischemic attack)     MEDICATIONS: Current Outpatient Medications on File Prior to Visit  Medication Sig Dispense Refill  . acetaminophen (TYLENOL) 325 MG tablet Take 2 tablets (650 mg total) by mouth every 6 (six) hours as needed for mild pain, fever or headache (or Fever >/= 101). 30 tablet 1  . amLODipine (NORVASC) 10 MG tablet Take 5 mg by mouth daily.     Marland Kitchen apixaban (ELIQUIS) 5 MG TABS tablet Take 1 tablet (5 mg total) by mouth 2 (two) times daily. Start on 01/272020 after completing the starter pack 60 tablet 2  . atorvastatin (LIPITOR) 40 MG tablet Take 40 mg by mouth daily.    Marland Kitchen ELIQUIS STARTER PACK (ELIQUIS STARTER PACK) 5 MG TABS Take as directed on package: start with two-60m tablets twice daily for 7 days. On day 8, switch to one-521mtablet twice daily. (Patient not taking: Reported on 03/16/2019) 1 each 0  . Glucosamine-Chondroit-Vit C-Mn (GLUCOSAMINE 1500 COMPLEX PO) Take 1,500 mg by mouth daily.    . Marland Kitchenbuprofen (ADVIL) 200 MG tablet Take 400 mg by mouth every 6 (six) hours as needed.    . Marland Kitchenosartan (COZAAR) 100 MG tablet Take 100 mg by mouth daily.    . methocarbamol (ROBAXIN) 500 MG tablet Take 1 tablet (500 mg total) by mouth 3 (three) times daily. (Patient not taking: Reported on 03/16/2019) 90 tablet 1  . nebivolol (BYSTOLIC) 10 MG tablet Take 1 tablet (10 mg total) by mouth daily. 30 tablet  5  . omeprazole (PRILOSEC) 20 MG capsule Take 20 mg by mouth daily.    Marland Kitchen oxyCODONE (OXY IR/ROXICODONE) 5 MG immediate release tablet Take 1 tablet (5 mg total) by mouth every 6 (six) hours as needed for moderate pain or severe pain. (Patient not taking: Reported on 03/16/2019) 15 tablet 0  . oxyCODONE-acetaminophen (PERCOCET/ROXICET) 5-325 MG tablet Take 1-2 tablets by mouth every 8 (eight) hours as needed for severe pain. 8 tablet 0  . peg 3350 powder (MOVIPREP) 100 G SOLR Take 1 kit (200 g total) by mouth as  directed. (Patient not taking: Reported on 04/22/2014) 1 kit 0  . senna-docusate (SENOKOT-S) 8.6-50 MG tablet Take 2 tablets by mouth 2 (two) times daily. (Patient not taking: Reported on 03/16/2019) 120 tablet 2  . tamsulosin (FLOMAX) 0.4 MG CAPS capsule Take 0.4 mg by mouth at bedtime.     . varenicline (CHANTIX) 0.5 MG tablet Take 0.5 mg by mouth 2 (two) times daily.     No current facility-administered medications on file prior to visit.    ALLERGIES: Allergies  Allergen Reactions  . Oxycodone Itching  . Vioxx [Rofecoxib] Swelling and Other (See Comments)    Caused mini-strokes    FAMILY HISTORY: Family History  Problem Relation Age of Onset  . Diabetes Mellitus II Mother   . Kidney disease Mother   . Lung cancer Sister    SOCIAL HISTORY: Social History   Socioeconomic History  . Marital status: Married    Spouse name: Not on file  . Number of children: Not on file  . Years of education: Not on file  . Highest education level: Not on file  Occupational History  . Not on file  Tobacco Use  . Smoking status: Former Smoker    Packs/day: 0.50    Years: 62.00    Pack years: 31.00    Types: Cigarettes  . Smokeless tobacco: Never Used  . Tobacco comment: quit 2 months ago (October 2019)  Vaping Use  . Vaping Use: Never used  Substance and Sexual Activity  . Alcohol use: No    Alcohol/week: 0.0 standard drinks  . Drug use: No  . Sexual activity: Not Currently  Other Topics Concern  . Not on file  Social History Narrative   Right handed one level home   Social Determinants of Health   Financial Resource Strain:   . Difficulty of Paying Living Expenses:   Food Insecurity:   . Worried About Charity fundraiser in the Last Year:   . Arboriculturist in the Last Year:   Transportation Needs:   . Film/video editor (Medical):   Marland Kitchen Lack of Transportation (Non-Medical):   Physical Activity:   . Days of Exercise per Week:   . Minutes of Exercise per Session:     Stress:   . Feeling of Stress :   Social Connections:   . Frequency of Communication with Friends and Family:   . Frequency of Social Gatherings with Friends and Family:   . Attends Religious Services:   . Active Member of Clubs or Organizations:   . Attends Archivist Meetings:   Marland Kitchen Marital Status:   Intimate Partner Violence:   . Fear of Current or Ex-Partner:   . Emotionally Abused:   Marland Kitchen Physically Abused:   . Sexually Abused:     PHYSICAL EXAM: *** General: No acute distress.  Patient appears well-groomed.   Head:  Normocephalic/atraumatic Eyes:  Fundi examined but not  visualized Neck: supple, no paraspinal tenderness, full range of motion Heart:  Regular rate and rhythm Lungs:  Clear to auscultation bilaterally Back: No paraspinal tenderness Neurological Exam: alert and oriented to person, place, and time. Attention span and concentration intact, recent and remote memory intact, fund of knowledge intact.  Speech fluent and not dysarthric, language intact.  CN II-XII intact. Bulk and tone normal, muscle strength 5/5 throughout.  Sensation to light touch, temperature and vibration intact.  Deep tendon reflexes 2+ throughout, toes downgoing.  Finger to nose and heel to shin testing intact.  Gait normal, Romberg negative.  IMPRESSION: 1.  Left sided facial numbness.  Questionable stroke 2.  Hypertension 3.  Hyperlipidemia 4.  History of pulmonary embolism on Eliquis  PLAN: Medical management of stroke risk factors: 1.  Eliquis 2.  Statin therapy 3.  Blood pressure control 4.  Follow up ***  Metta Clines, DO  CC: Anastasia Pall, MD

## 2019-11-04 ENCOUNTER — Ambulatory Visit: Payer: Medicare Other | Admitting: Neurology

## 2020-04-15 IMAGING — CT CT HEAD W/O CM
3 series · 16 of 47 positions shown, 19 images · non-contrast
Comparison: Head CT dated 04/08/2018.

CLINICAL DATA: 78-year-old male with fall and head injury.

EXAM:
CT HEAD WITHOUT CONTRAST
TECHNIQUE: Contiguous axial images were obtained from the base of the skull
through the vertex without intravenous contrast.

[Series 3: head 5.0 h30s · axial · 0.43mm/px · z∈[-149,-9]mm · 10 of 34 slices shown, 13 images]
[im 3/34  brain]
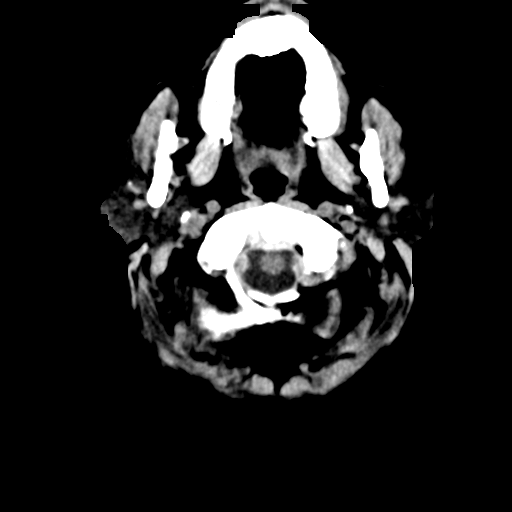
[im 3/34  bone]
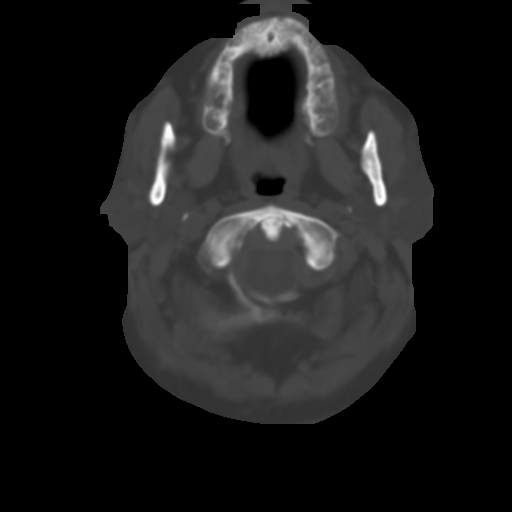
[im 6/34  brain]
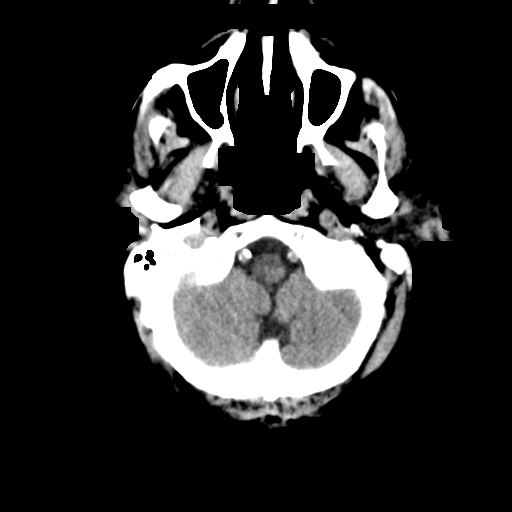
[im 10/34  brain]
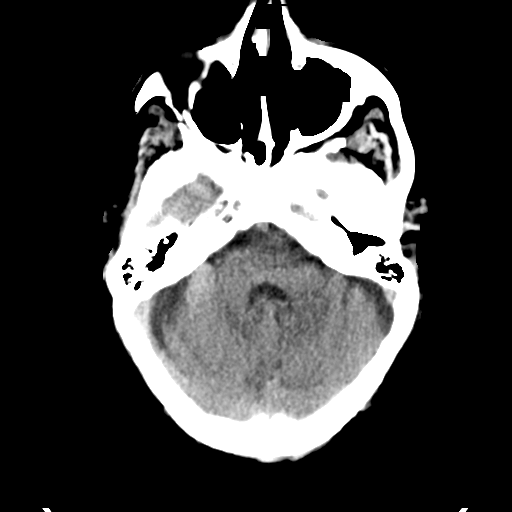
[im 12/34  brain]
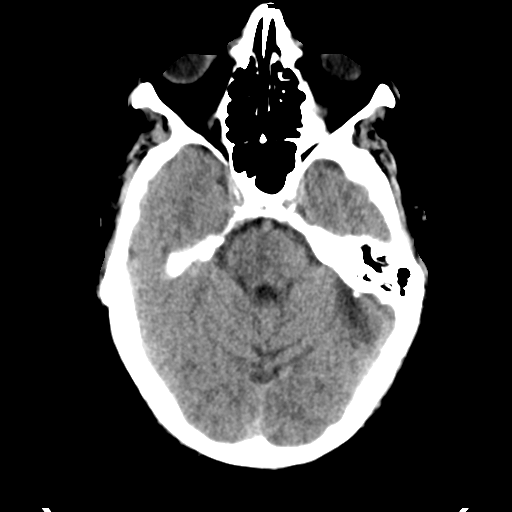
[im 15/34  brain]
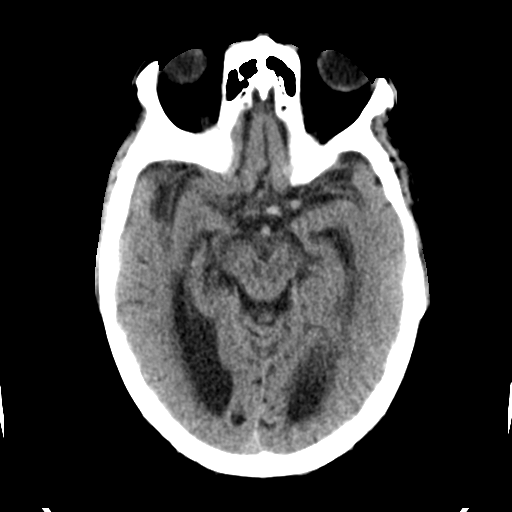
[im 15/34  bone]
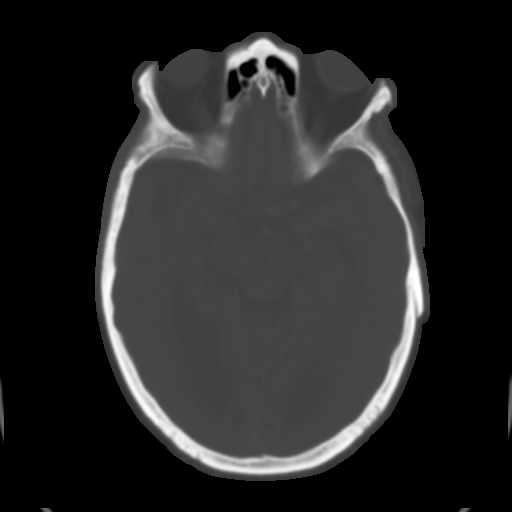
[im 19/34  brain]
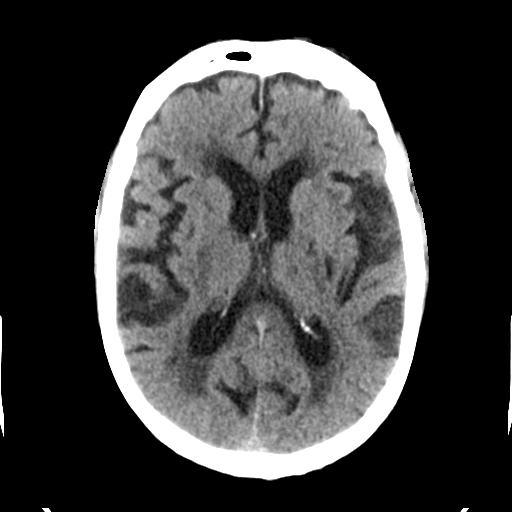
[im 22/34  brain]
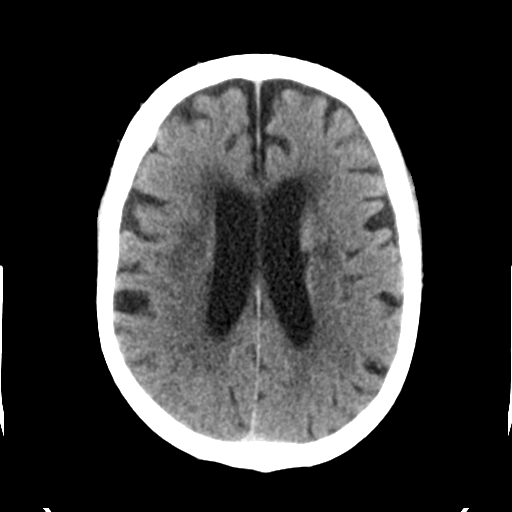
[im 26/34  brain]
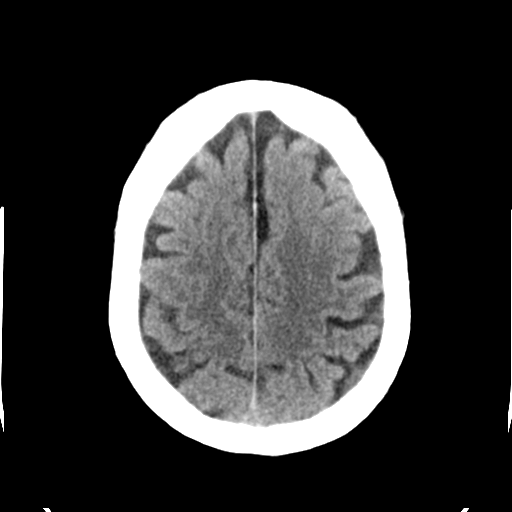
[im 28/34  brain]
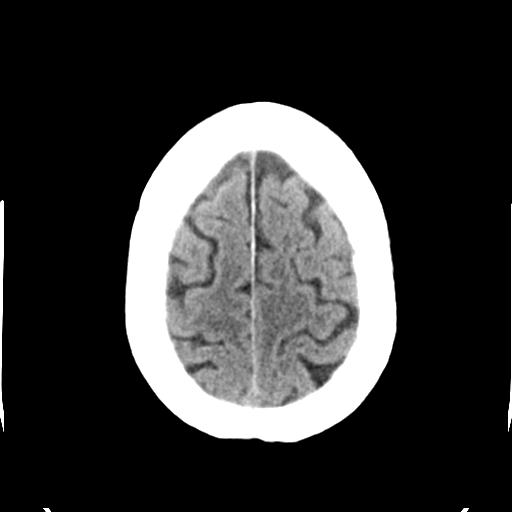
[im 28/34  bone]
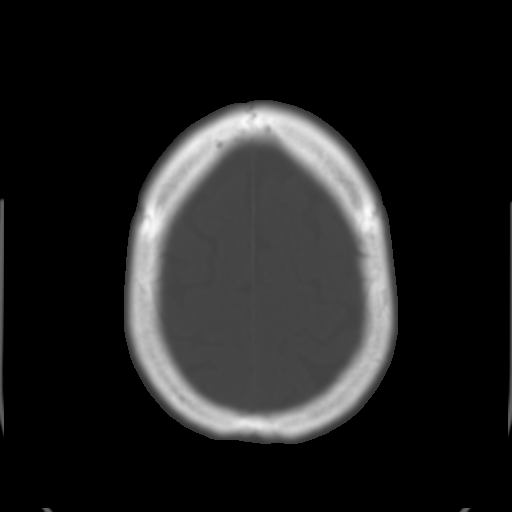
[im 31/34  brain]
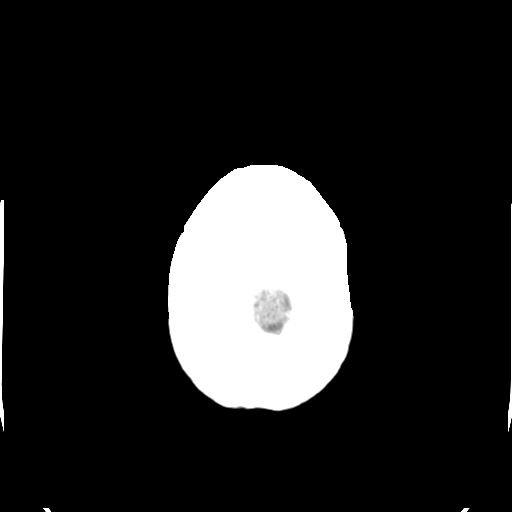

[Series 5: head 3.0 mpr cor · coronal · 0.35mm/px · 3 of 75 slices shown]
[im 25/75  brain]
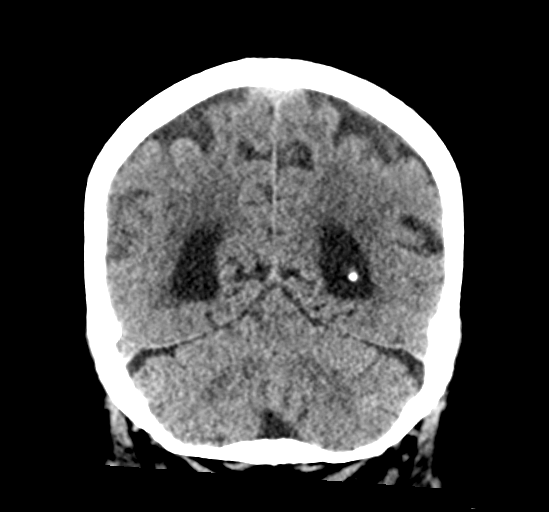
[im 33/75  brain]
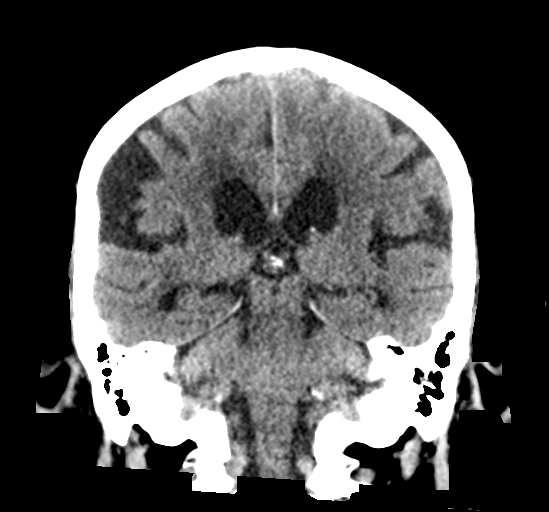
[im 42/75  brain]
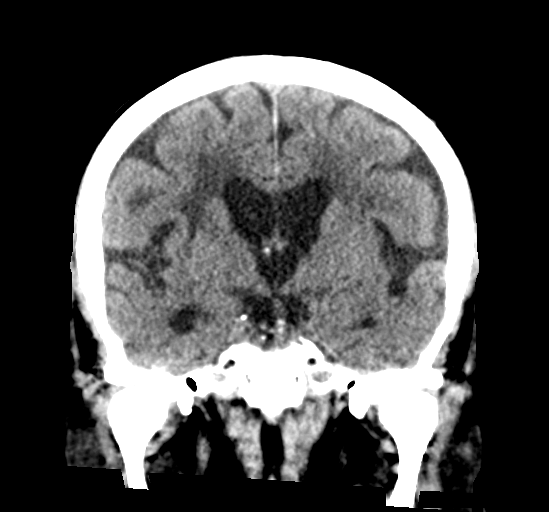

[Series 6: head 3.0 mpr sag · sagittal · 0.34mm/px · 3 of 61 slices shown]
[im 21/61  brain]
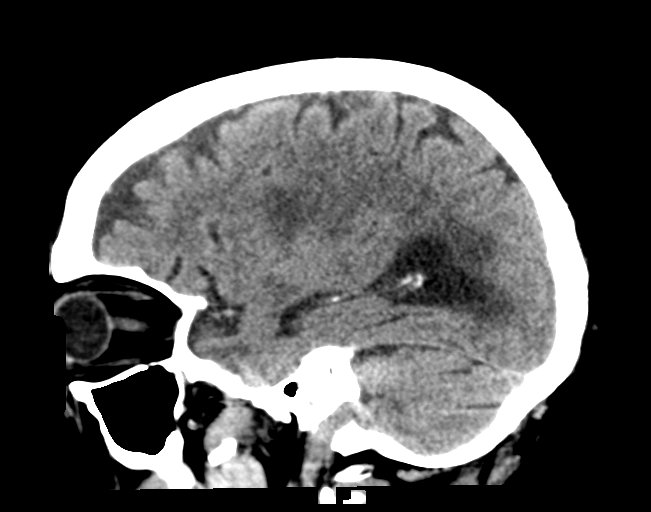
[im 31/61  brain]
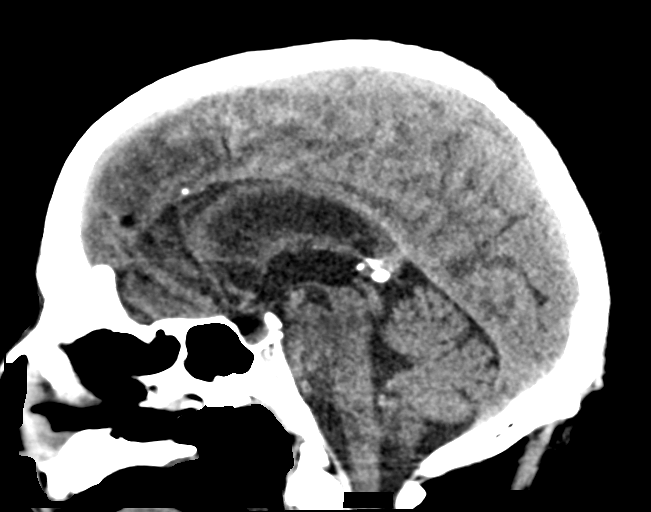
[im 41/61  brain]
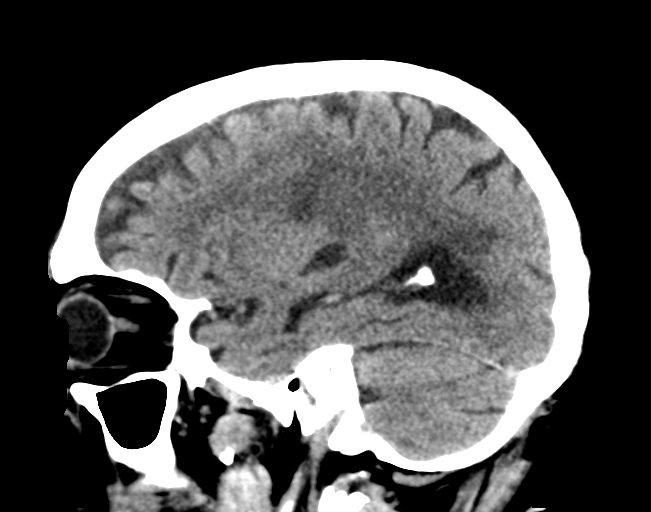

[16 of 47 positions shown; findings below may reference images not displayed]

FINDINGS: Brain: There is moderate age-related atrophy and chronic
microvascular ischemic changes. Bilateral old basal ganglia lacunar
infarcts noted. There is no acute intracranial hemorrhage. No mass
effect or midline shift. No extra-axial fluid collection.

Vascular: No hyperdense vessel or unexpected calcification.

Skull: Normal. Negative for fracture or focal lesion.

Sinuses/Orbits: No acute finding.

Other: None
IMPRESSION: 1. No acute intracranial hemorrhage.
2. Age-related atrophy and chronic microvascular ischemic changes.
Old bilateral basal ganglia lacunar infarct.

## 2020-09-28 ENCOUNTER — Other Ambulatory Visit: Payer: Self-pay | Admitting: *Deleted

## 2020-09-28 DIAGNOSIS — I714 Abdominal aortic aneurysm, without rupture, unspecified: Secondary | ICD-10-CM

## 2020-09-29 ENCOUNTER — Encounter: Payer: Self-pay | Admitting: Vascular Surgery

## 2020-09-29 ENCOUNTER — Ambulatory Visit (INDEPENDENT_AMBULATORY_CARE_PROVIDER_SITE_OTHER): Payer: Medicare Other | Admitting: Vascular Surgery

## 2020-09-29 ENCOUNTER — Ambulatory Visit (INDEPENDENT_AMBULATORY_CARE_PROVIDER_SITE_OTHER)
Admission: RE | Admit: 2020-09-29 | Discharge: 2020-09-29 | Disposition: A | Discharge status: Home / Self Care | Payer: Medicare Other | Source: Ambulatory Visit | Attending: Family Medicine | Admitting: Family Medicine

## 2020-09-29 ENCOUNTER — Ambulatory Visit (HOSPITAL_COMMUNITY)
Admission: RE | Admit: 2020-09-29 | Discharge: 2020-09-29 | Disposition: A | Discharge status: Home / Self Care | Payer: Medicare Other | Source: Ambulatory Visit | Attending: Vascular Surgery | Admitting: Vascular Surgery

## 2020-09-29 ENCOUNTER — Other Ambulatory Visit: Payer: Self-pay

## 2020-09-29 ENCOUNTER — Other Ambulatory Visit (HOSPITAL_COMMUNITY): Payer: Self-pay | Admitting: Vascular Surgery

## 2020-09-29 VITALS — Resp 18 | Ht 66.0 in | Wt 165.0 lb

## 2020-09-29 DIAGNOSIS — I714 Abdominal aortic aneurysm, without rupture, unspecified: Secondary | ICD-10-CM

## 2020-09-29 DIAGNOSIS — I739 Peripheral vascular disease, unspecified: Secondary | ICD-10-CM

## 2020-09-29 NOTE — Progress Notes (Signed)
Patient is an 80 year old male who was initially evaluated by Dr. Kellie Simmering in 2012 for 3.5 cm abdominal aortic aneurysm.  He had a CT scan in December 2020 which showed the aneurysm diameter had increased in size to 4.5 cm.  Recent lumbar spine x-ray suggested aneurysm diameter of 5.1 cm.  The x-ray was performed for chronic back pain.  He is on Eliquis for a DVT and pulmonary embolus that occurred in 2020 while he had COVID-19.  He is on a statin.  He does have a half brother with a family history of popliteal aneurysms.  He currently is smoking a third a pack of cigarettes per day.  He is trying to quit with Chantix.  He does get fatigued easily but does not really describe shortness of breath or chest pain.  Past Medical History:  Diagnosis Date   AAA (abdominal aortic aneurysm) (HCC)    Arthritis    GERD (gastroesophageal reflux disease)    Hyperlipidemia    Hypertension    Lung nodule    TIA (transient ischemic attack)     Past Surgical History:  Procedure Laterality Date   APPENDECTOMY     BACK SURGERY     CHOLECYSTECTOMY     COLONOSCOPY N/A 05/05/2014   Procedure: COLONOSCOPY;  Surgeon: Daneil Dolin, MD;  Location: [facility];  Service: Endoscopy;  Laterality: N/A;  100   MULTIPLE TOOTH EXTRACTIONS      Social History   Socioeconomic History   Marital status: Married    Spouse name: Not on file   Number of children: Not on file   Years of education: Not on file   Highest education level: Not on file  Occupational History   Not on file  Tobacco Use   Smoking status: Former    Packs/day: 0.50    Years: 62.00    Pack years: 31.00    Types: Cigarettes   Smokeless tobacco: Never   Tobacco comments:    quit 2 months ago (October 2019)  Vaping Use   Vaping Use: Never used  Substance and Sexual Activity   Alcohol use: No    Alcohol/week: 0.0 standard drinks   Drug use: No   Sexual activity: Not Currently  Other Topics Concern   Not on file  Social History Narrative    Right handed one level home   Social Determinants of Health   Financial Resource Strain: Not on file  Food Insecurity: Not on file  Transportation Needs: Not on file  Physical Activity: Not on file  Stress: Not on file  Social Connections: Not on file  Intimate Partner Violence: Not on file    Current Outpatient Medications on File Prior to Visit  Medication Sig Dispense Refill   acetaminophen (TYLENOL) 325 MG tablet Take 2 tablets (650 mg total) by mouth every 6 (six) hours as needed for mild pain, fever or headache (or Fever >/= 101). 30 tablet 1   amLODipine (NORVASC) 10 MG tablet Take 5 mg by mouth daily.      apixaban (ELIQUIS) 5 MG TABS tablet Take 1 tablet (5 mg total) by mouth 2 (two) times daily. Start on 01/272020 after completing the starter pack 60 tablet 2   atorvastatin (LIPITOR) 40 MG tablet Take 40 mg by mouth daily.     ELIQUIS STARTER PACK (ELIQUIS STARTER PACK) 5 MG TABS Take as directed on package: start with two-64m tablets twice daily for 7 days. On day 8, switch to one-565mtablet twice daily. (  Patient not taking: Reported on 03/16/2019) 1 each 0   Glucosamine-Chondroit-Vit C-Mn (GLUCOSAMINE 1500 COMPLEX PO) Take 1,500 mg by mouth daily.     ibuprofen (ADVIL) 200 MG tablet Take 400 mg by mouth every 6 (six) hours as needed.     losartan (COZAAR) 100 MG tablet Take 100 mg by mouth daily.     methocarbamol (ROBAXIN) 500 MG tablet Take 1 tablet (500 mg total) by mouth 3 (three) times daily. (Patient not taking: Reported on 03/16/2019) 90 tablet 1   nebivolol (BYSTOLIC) 10 MG tablet Take 1 tablet (10 mg total) by mouth daily. 30 tablet 5   omeprazole (PRILOSEC) 20 MG capsule Take 20 mg by mouth daily.     oxyCODONE (OXY IR/ROXICODONE) 5 MG immediate release tablet Take 1 tablet (5 mg total) by mouth every 6 (six) hours as needed for moderate pain or severe pain. (Patient not taking: Reported on 03/16/2019) 15 tablet 0   oxyCODONE-acetaminophen (PERCOCET/ROXICET) 5-325 MG  tablet Take 1-2 tablets by mouth every 8 (eight) hours as needed for severe pain. 8 tablet 0   peg 3350 powder (MOVIPREP) 100 G SOLR Take 1 kit (200 g total) by mouth as directed. (Patient not taking: Reported on 04/22/2014) 1 kit 0   senna-docusate (SENOKOT-S) 8.6-50 MG tablet Take 2 tablets by mouth 2 (two) times daily. (Patient not taking: Reported on 03/16/2019) 120 tablet 2   tamsulosin (FLOMAX) 0.4 MG CAPS capsule Take 0.4 mg by mouth at bedtime.      varenicline (CHANTIX) 0.5 MG tablet Take 0.5 mg by mouth 2 (two) times daily.     No current facility-administered medications on file prior to visit.     Physical exam:  Vitals:   09/29/20 0833  Resp: 18  Weight: 165 lb (74.8 kg)  Height: _0  (1.676 m)   Abdomen: Soft nontender nondistended no obvious pulsatile mass  Extremities: 2+ femoral 3+ popliteal 2+ dorsalis pedis posterior tibial pulses bilaterally   Data: Patient had a carotid duplex exam July 17, 2019 which showed less than 50% stenosis bilateral internal carotid arteries with antegrade vertebral flow.  He had an ultrasound of his abdominal aorta today which showed the aneurysm diameter was 4.6 cm.  I would suggest this is essentially the same as the 4.5 cm obtained on CT scan in December 2020.  On that CT scan there was no iliac or thoracic aortic component.  He also had bilateral popliteal ultrasound today which showed no evidence of popliteal aneurysm.  Assessment: 4.6 cm abdominal aortic aneurysm normal growth compared to CT scan 2 years ago.  No evidence of popliteal aneurysm.  Plan: Follow-up aortic ultrasound in 1 year.  Will be seen in our [facility].  If the aneurysm continues to grow at the next office visit we may consider going to 57-monthintervals.  CRuta Hinds MD [facility]: [phone]

## 2021-04-29 ENCOUNTER — Encounter: Payer: Self-pay | Admitting: *Deleted

## 2021-12-30 ENCOUNTER — Other Ambulatory Visit: Payer: Self-pay | Admitting: *Deleted

## 2021-12-30 DIAGNOSIS — I714 Abdominal aortic aneurysm, without rupture, unspecified: Secondary | ICD-10-CM

## 2022-01-06 NOTE — Progress Notes (Unsigned)
Office Note     CC:  follow up Requesting Provider:  Chesley Noon, MD  HPI: Andrew Hunt is a 81 y.o. (1940-11-26) male who presents for routine follow up of AAA. He was initially evaluated by Dr. Kellie Simmering in 2012 for 3.5 cm abdominal aortic aneurysm.  He had a CT scan in December 2020 which showed the aneurysm diameter had increased in size to 4.5 cm. Lumbar spine x-ray suggested aneurysm diameter of 5.1 cm.  The x-ray was performed for chronic back pain.  At his last visit with Dr. Oneida Alar in June of 2022 his aneurysm at largest diameter was 4.6 cm by duplex. He has had no popliteal aneurysms identified on duplex.   Today he reports no new back pain. Has history of chronic back pain. No abdominal pain. He denies any pain in his legs on ambulation or rest. No tissue loss.  He is on Eliquis for a DVT and pulmonary embolus that occurred in 2020 while he had COVID-19.     The pt is on a statin for cholesterol management.  The pt is not on a daily aspirin.   Other AC:  Eliquis The pt is on ARB, BB, CCB for hypertension.   The pt not diabetic.   Tobacco hx:  current, 1/3 ppd  Past Medical History:  Diagnosis Date   AAA (abdominal aortic aneurysm) (HCC)    Arthritis    GERD (gastroesophageal reflux disease)    Hyperlipidemia    Hypertension    Lung nodule    TIA (transient ischemic attack)     Past Surgical History:  Procedure Laterality Date   APPENDECTOMY     BACK SURGERY     CHOLECYSTECTOMY     COLONOSCOPY N/A 05/05/2014   Procedure: COLONOSCOPY;  Surgeon: Daneil Dolin, MD;  Location: [facility];  Service: Endoscopy;  Laterality: N/A;  100   MULTIPLE TOOTH EXTRACTIONS      Social History   Socioeconomic History   Marital status: Married    Spouse name: Not on file   Number of children: Not on file   Years of education: Not on file   Highest education level: Not on file  Occupational History   Not on file  Tobacco Use   Smoking status: Former    Packs/day:  0.50    Years: 62.00    Total pack years: 31.00    Types: Cigarettes   Smokeless tobacco: Never   Tobacco comments:    quit 2 months ago (October 2019)  Vaping Use   Vaping Use: Never used  Substance and Sexual Activity   Alcohol use: No    Alcohol/week: 0.0 standard drinks of alcohol   Drug use: No   Sexual activity: Not Currently  Other Topics Concern   Not on file  Social History Narrative   Right handed one level home   Social Determinants of Health   Financial Resource Strain: Not on file  Food Insecurity: Not on file  Transportation Needs: Not on file  Physical Activity: Not on file  Stress: Not on file  Social Connections: Not on file  Intimate Partner Violence: Not on file    Family History  Problem Relation Age of Onset   Diabetes Mellitus II Mother    Kidney disease Mother    Lung cancer Sister     Current Outpatient Medications  Medication Sig Dispense Refill   acetaminophen (TYLENOL) 325 MG tablet Take 2 tablets (650 mg total) by mouth every 6 (  six) hours as needed for mild pain, fever or headache (or Fever >/= 101). 30 tablet 1   amLODipine (NORVASC) 10 MG tablet Take 5 mg by mouth daily.      apixaban (ELIQUIS) 5 MG TABS tablet Take 1 tablet (5 mg total) by mouth 2 (two) times daily. Start on 01/272020 after completing the starter pack 60 tablet 2   atorvastatin (LIPITOR) 40 MG tablet Take 40 mg by mouth daily.     Glucosamine-Chondroit-Vit C-Mn (GLUCOSAMINE 1500 COMPLEX PO) Take 1,500 mg by mouth daily.     ibuprofen (ADVIL) 200 MG tablet Take 400 mg by mouth every 6 (six) hours as needed.     losartan (COZAAR) 100 MG tablet Take 100 mg by mouth daily.     nebivolol (BYSTOLIC) 10 MG tablet Take 1 tablet (10 mg total) by mouth daily. 30 tablet 5   omeprazole (PRILOSEC) 20 MG capsule Take 20 mg by mouth daily.     oxyCODONE-acetaminophen (PERCOCET/ROXICET) 5-325 MG tablet Take 1-2 tablets by mouth every 8 (eight) hours as needed for severe pain. 8 tablet 0    tamsulosin (FLOMAX) 0.4 MG CAPS capsule Take 0.4 mg by mouth at bedtime.      tiZANidine (ZANAFLEX) 4 MG tablet Take 2-4 mg by mouth at bedtime as needed.     ELIQUIS STARTER PACK (ELIQUIS STARTER PACK) 5 MG TABS Take as directed on package: start with two-$RemoveBefore'5mg'UsDxBqHBDPhke$  tablets twice daily for 7 days. On day 8, switch to one-$RemoveBefor'5mg'HRjAVFJsgwQh$  tablet twice daily. (Patient not taking: Reported on 01/09/2022) 1 each 0   methocarbamol (ROBAXIN) 500 MG tablet Take 1 tablet (500 mg total) by mouth 3 (three) times daily. (Patient not taking: Reported on 03/16/2019) 90 tablet 1   oxyCODONE (OXY IR/ROXICODONE) 5 MG immediate release tablet Take 1 tablet (5 mg total) by mouth every 6 (six) hours as needed for moderate pain or severe pain. (Patient not taking: Reported on 03/16/2019) 15 tablet 0   peg 3350 powder (MOVIPREP) 100 G SOLR Take 1 kit (200 g total) by mouth as directed. (Patient not taking: Reported on 04/22/2014) 1 kit 0   senna-docusate (SENOKOT-S) 8.6-50 MG tablet Take 2 tablets by mouth 2 (two) times daily. (Patient not taking: Reported on 03/16/2019) 120 tablet 2   varenicline (CHANTIX) 0.5 MG tablet Take 0.5 mg by mouth 2 (two) times daily. (Patient not taking: Reported on 09/29/2020)     No current facility-administered medications for this visit.    Allergies  Allergen Reactions   Hydrocodone Itching   Oxycodone Itching   Vioxx [Rofecoxib] Swelling and Other (See Comments)    Caused mini-strokes     REVIEW OF SYSTEMS:  $RemoveB'[X]'npUWnbPN$  denotes positive finding, $RemoveBeforeDEI'[ ]'KkLTJgcWxDUYdNCz$  denotes negative finding Cardiac  Comments:  Chest pain or chest pressure:    Shortness of breath upon exertion:    Short of breath when lying flat:    Irregular heart rhythm:        Vascular    Pain in calf, thigh, or hip brought on by ambulation:    Pain in feet at night that wakes you up from your sleep:     Blood clot in your veins:    Leg swelling:         Pulmonary    Oxygen at home:    Productive cough:     Wheezing:         Neurologic     Sudden weakness in arms or legs:     Sudden numbness in  arms or legs:     Sudden onset of difficulty speaking or slurred speech:    Temporary loss of vision in one eye:     Problems with dizziness:         Gastrointestinal    Blood in stool:     Vomited blood:         Genitourinary    Burning when urinating:     Blood in urine:        Psychiatric    Major depression:         Hematologic    Bleeding problems:    Problems with blood clotting too easily:        Skin    Rashes or ulcers:        Constitutional    Fever or chills:      PHYSICAL EXAMINATION:  Vitals:   01/09/22 0823  Resp: 18  SpO2: 100%  Weight: 164 lb (74.4 kg)  Height: 5' 7"  (1.702 m)    General:  WDWN in NAD; vital signs documented above Gait: Normal HENT: WNL, normocephalic Pulmonary: normal non-labored breathing  Cardiac: regular HR, without  Murmurs without carotid bruit Abdomen: soft, NT, no masses. No palpable AAA Vascular Exam/Pulses:  Right Left  Radial 2+ (normal) 2+ (normal)  Femoral 2+ (normal) 2+ (normal)  Popliteal Not palpable Not palpable  DP 2+ (normal) 2+ (normal)  PT 1+ (weak) 1+ (weak)   Extremities: without ischemic changes, without Gangrene , without cellulitis; without open wounds;  Musculoskeletal: no muscle wasting or atrophy  Neurologic: A&O X 3;  No focal weakness or paresthesias are detected Psychiatric:  The pt has Normal affect.   Non-Invasive Vascular Imaging:    Abdominal Aorta Findings:  +-----------+-------+----------+----------+--------+--------+--------+  Location   AP (cm)Trans (cm)PSV (cm/s)WaveformThrombusComments  +-----------+-------+----------+----------+--------+--------+--------+  Proximal   2.70   2.90      56                                  +-----------+-------+----------+----------+--------+--------+--------+  Mid        4.20   4.30      61                                   +-----------+-------+----------+----------+--------+--------+--------+  Distal     4.50   4.80      50                                  +-----------+-------+----------+----------+--------+--------+--------+  RT CIA Prox1.0    1.0       93                                  +-----------+-------+----------+----------+--------+--------+--------+  LT CIA Prox1.0    0.9       121                                 +-----------+-------+----------+----------+--------+--------+--------+   Summary:  Abdominal Aorta: There is evidence of abnormal dilatation of the distal Abdominal aorta. The largest aortic diameter remains essentially unchanged compared to prior exam. Previous diameter measurement was 4.3 cm obtained on 09/29/2020.    ASSESSMENT/PLAN:: 81 y.o.  male here for follow up for AAA. His Aneurysm on prior duplex was 4.6 cm at greatest diameter. Only slight change today with 4.8 cm maximum diameter. He remains without any associated back pain or abdominal pain.  - Continue on currently medications - discussed importance of continued blood pressure control - Consideration for repair of AAA would be made when the size is 5.5cm, growth > 1 cm/yr, and symptomatic status. - Advised patient if her were to ever develop shearing back or abdominal pain to immediately call 911 or go to ER -He will follow up in 1 year with repeat AAA duplex   Karoline Caldwell, PA-C [facility] [phone]  Clinic MD:   Trula Slade

## 2022-01-09 ENCOUNTER — Ambulatory Visit (INDEPENDENT_AMBULATORY_CARE_PROVIDER_SITE_OTHER): Payer: Medicare Other | Admitting: Physician Assistant

## 2022-01-09 ENCOUNTER — Ambulatory Visit (HOSPITAL_COMMUNITY)
Admission: RE | Admit: 2022-01-09 | Discharge: 2022-01-09 | Disposition: A | Discharge status: Home / Self Care | Payer: Medicare Other | Source: Ambulatory Visit | Attending: Surgery | Admitting: Surgery

## 2022-01-09 VITALS — Resp 18 | Ht 67.0 in | Wt 164.0 lb

## 2022-01-09 DIAGNOSIS — I714 Abdominal aortic aneurysm, without rupture, unspecified: Secondary | ICD-10-CM | POA: Diagnosis present

## 2022-06-20 ENCOUNTER — Emergency Department (HOSPITAL_COMMUNITY): Payer: Medicare Other

## 2022-06-20 ENCOUNTER — Encounter (HOSPITAL_COMMUNITY): Payer: Self-pay

## 2022-06-20 ENCOUNTER — Other Ambulatory Visit: Payer: Self-pay

## 2022-06-20 ENCOUNTER — Emergency Department (HOSPITAL_COMMUNITY)
Admission: EM | Admit: 2022-06-20 | Discharge: 2022-06-20 | Disposition: A | Discharge status: Home / Self Care | Payer: Medicare Other | Attending: Emergency Medicine | Admitting: Emergency Medicine

## 2022-06-20 DIAGNOSIS — R0602 Shortness of breath: Secondary | ICD-10-CM | POA: Diagnosis present

## 2022-06-20 DIAGNOSIS — I509 Heart failure, unspecified: Secondary | ICD-10-CM | POA: Diagnosis not present

## 2022-06-20 DIAGNOSIS — Z20822 Contact with and (suspected) exposure to covid-19: Secondary | ICD-10-CM | POA: Insufficient documentation

## 2022-06-20 DIAGNOSIS — I110 Hypertensive heart disease with heart failure: Secondary | ICD-10-CM | POA: Diagnosis not present

## 2022-06-20 DIAGNOSIS — Z79899 Other long term (current) drug therapy: Secondary | ICD-10-CM | POA: Insufficient documentation

## 2022-06-20 LAB — COMPREHENSIVE METABOLIC PANEL
ALT: 19 U/L (ref 0–44)
AST: 20 U/L (ref 15–41)
Albumin: 3.2 g/dL — ABNORMAL LOW (ref 3.5–5.0)
Alkaline Phosphatase: 52 U/L (ref 38–126)
Anion gap: 8 (ref 5–15)
BUN: 15 mg/dL (ref 8–23)
CO2: 23 mmol/L (ref 22–32)
Calcium: 6.8 mg/dL — ABNORMAL LOW (ref 8.9–10.3)
Chloride: 108 mmol/L (ref 98–111)
Creatinine, Ser: 1.29 mg/dL — ABNORMAL HIGH (ref 0.61–1.24)
GFR, Estimated: 56 mL/min — ABNORMAL LOW (ref >60)
Glucose, Bld: 108 mg/dL — ABNORMAL HIGH (ref 70–99)
Potassium: 3.3 mmol/L — ABNORMAL LOW (ref 3.5–5.1)
Sodium: 139 mmol/L (ref 135–145)
Total Bilirubin: 0.1 mg/dL — ABNORMAL LOW (ref 0.3–1.2)
Total Protein: 6.5 g/dL (ref 6.5–8.1)

## 2022-06-20 LAB — CBC WITH DIFFERENTIAL/PLATELET
Abs Immature Granulocytes: 0.04 10*3/uL (ref 0.00–0.07)
Basophils Absolute: 0.1 10*3/uL (ref 0.0–0.1)
Basophils Relative: 1 %
Eosinophils Absolute: 0.2 10*3/uL (ref 0.0–0.5)
Eosinophils Relative: 2 %
HCT: 34 % — ABNORMAL LOW (ref 39.0–52.0)
Hemoglobin: 11.8 g/dL — ABNORMAL LOW (ref 13.0–17.0)
Immature Granulocytes: 1 %
Lymphocytes Relative: 20 %
Lymphs Abs: 1.8 10*3/uL (ref 0.7–4.0)
MCH: 32 pg (ref 26.0–34.0)
MCHC: 34.7 g/dL (ref 30.0–36.0)
MCV: 92.1 fL (ref 80.0–100.0)
Monocytes Absolute: 0.8 10*3/uL (ref 0.1–1.0)
Monocytes Relative: 9 %
Neutro Abs: 5.9 10*3/uL (ref 1.7–7.7)
Neutrophils Relative %: 67 %
Platelets: 174 10*3/uL (ref 150–400)
RBC: 3.69 MIL/uL — ABNORMAL LOW (ref 4.22–5.81)
RDW: 12.9 % (ref 11.5–15.5)
WBC: 8.8 10*3/uL (ref 4.0–10.5)
nRBC: 0 % (ref 0.0–0.2)

## 2022-06-20 LAB — RESP PANEL BY RT-PCR (RSV, FLU A&B, COVID)  RVPGX2
Influenza A by PCR: NEGATIVE
Influenza B by PCR: NEGATIVE
Resp Syncytial Virus by PCR: NEGATIVE
SARS Coronavirus 2 by RT PCR: NEGATIVE

## 2022-06-20 LAB — BRAIN NATRIURETIC PEPTIDE: B Natriuretic Peptide: 140.9 pg/mL — ABNORMAL HIGH (ref 0.0–100.0)

## 2022-06-20 MED ORDER — FUROSEMIDE 20 MG PO TABS: 20.0000 mg | ORAL_TABLET | Freq: Every day | ORAL | 0 refills | Status: DC

## 2022-06-20 MED ORDER — FUROSEMIDE 10 MG/ML IJ SOLN
40.0000 mg | Freq: Once | INTRAMUSCULAR | Status: AC
  Administered 2022-06-20: 40 mg via INTRAVENOUS
  Filled 2022-06-20: qty 4

## 2022-06-20 NOTE — ED Provider Notes (Signed)
[facility] Provider Note   CSN: [id number] Arrival date & time: 06/20/22  1242     History  Chief Complaint  Patient presents with   Shortness of Breath    Andrew Hunt is a 82 y.o. male.   Shortness of Breath Patient shortness of breath.  Cough has had for the last couple weeks.  Exertional.  Some swelling his leg.  Does have a cough with green phlegm also.  No definite sick contacts.  States he does has a history of hypertension.    Past Medical History:  Diagnosis Date   AAA (abdominal aortic aneurysm) (HCC)    Arthritis    GERD (gastroesophageal reflux disease)    Hyperlipidemia    Hypertension    Lung nodule    TIA (transient ischemic attack)     Home Medications Prior to Admission medications   Medication Sig Start Date End Date Taking? Authorizing Provider  acetaminophen (TYLENOL) 325 MG tablet Take 2 tablets (650 mg total) by mouth every 6 (six) hours as needed for mild pain, fever or headache (or Fever >/= 101). 04/09/18   Roxan Hockey, MD  amLODipine (NORVASC) 10 MG tablet Take 5 mg by mouth daily.     [provider]  apixaban (ELIQUIS) 5 MG TABS tablet Take 1 tablet (5 mg total) by mouth 2 (two) times daily. Start on 01/272020 after completing the starter pack 04/16/18   Roxan Hockey, MD  atorvastatin (LIPITOR) 40 MG tablet Take 40 mg by mouth daily.    [provider]  ELIQUIS STARTER PACK (ELIQUIS STARTER PACK) 5 MG TABS Take as directed on package: start with two-'5mg'$  tablets twice daily for 7 days. On day 8, switch to one-'5mg'$  tablet twice daily. Patient not taking: Reported on 01/09/2022 04/09/18   Roxan Hockey, MD  Glucosamine-Chondroit-Vit C-Mn (GLUCOSAMINE 1500 COMPLEX PO) Take 1,500 mg by mouth daily.    [provider]  ibuprofen (ADVIL) 200 MG tablet Take 400 mg by mouth every 6 (six) hours as needed.    [provider]  losartan (COZAAR) 100 MG tablet Take  100 mg by mouth daily. 12/24/18   [provider]  methocarbamol (ROBAXIN) 500 MG tablet Take 1 tablet (500 mg total) by mouth 3 (three) times daily. Patient not taking: Reported on 03/16/2019 04/09/18   Roxan Hockey, MD  nebivolol (BYSTOLIC) 10 MG tablet Take 1 tablet (10 mg total) by mouth daily. 04/10/18   Roxan Hockey, MD  omeprazole (PRILOSEC) 20 MG capsule Take 20 mg by mouth daily.    [provider]  oxyCODONE (OXY IR/ROXICODONE) 5 MG immediate release tablet Take 1 tablet (5 mg total) by mouth every 6 (six) hours as needed for moderate pain or severe pain. Patient not taking: Reported on 03/16/2019 04/09/18   Roxan Hockey, MD  oxyCODONE-acetaminophen (PERCOCET/ROXICET) 5-325 MG tablet Take 1-2 tablets by mouth every 8 (eight) hours as needed for severe pain. 03/16/19   Providence Lanius A, PA-C  peg 3350 powder (MOVIPREP) 100 G SOLR Take 1 kit (200 g total) by mouth as directed. Patient not taking: Reported on 04/22/2014 06/25/13   Daneil Dolin, MD  senna-docusate (SENOKOT-S) 8.6-50 MG tablet Take 2 tablets by mouth 2 (two) times daily. Patient not taking: Reported on 03/16/2019 04/09/18   Roxan Hockey, MD  tamsulosin (FLOMAX) 0.4 MG CAPS capsule Take 0.4 mg by mouth at bedtime.     [provider]  tiZANidine (ZANAFLEX) 4 MG tablet Take  2-4 mg by mouth at bedtime as needed. 01/01/22   [provider]  varenicline (CHANTIX) 0.5 MG tablet Take 0.5 mg by mouth 2 (two) times daily. Patient not taking: Reported on 09/29/2020    [provider]      Allergies    Hydrocodone, Oxycodone, and Vioxx [rofecoxib]    Review of Systems   Review of Systems  Respiratory:  Positive for shortness of breath.     Physical Exam Updated Vital Signs BP (!) 149/67   Pulse (!) 52   Temp 98 F (36.7 C) (Oral)   Resp 17   Ht '5\' 7"'$  (1.702 m)   Wt 79.4 kg   SpO2 95%   BMI 27.41 kg/m  Physical Exam Vitals reviewed.  Cardiovascular:     Rate and  Rhythm: Normal rate.  Pulmonary:     Comments: Harsh breath sounds bilateral bases. Chest:     Chest wall: No tenderness.  Musculoskeletal:     Right lower leg: Edema present.     Left lower leg: Edema present.     Comments: Pitting edema bilateral lower extremities.  Skin:    Capillary Refill: Capillary refill takes less than 2 seconds.  Neurological:     Mental Status: He is alert.     ED Results / Procedures / Treatments   Labs (all labs ordered are listed, but only abnormal results are displayed) Labs Reviewed  BRAIN NATRIURETIC PEPTIDE - Abnormal; Notable for the following components:      Result Value   B Natriuretic Peptide 140.9 (*)    All other components within normal limits  CBC WITH DIFFERENTIAL/PLATELET - Abnormal; Notable for the following components:   RBC 3.69 (*)    Hemoglobin 11.8 (*)    HCT 34.0 (*)    All other components within normal limits  COMPREHENSIVE METABOLIC PANEL - Abnormal; Notable for the following components:   Potassium 3.3 (*)    Glucose, Bld 108 (*)    Creatinine, Ser 1.29 (*)    Calcium 6.8 (*)    Albumin 3.2 (*)    Total Bilirubin 0.1 (*)    GFR, Estimated 56 (*)    All other components within normal limits  RESP PANEL BY RT-PCR (RSV, FLU A&B, COVID)  [id number]    EKG EKG Interpretation  Date/Time:  Tuesday June 20 2022 13:12:17 EDT Ventricular Rate:  54 PR Interval:  214 QRS Duration: 106 QT Interval:  452 QTC Calculation: 429 R Axis:   42 Text Interpretation: Sinus rhythm Ventricular premature complex RSR' in V1 or V2, right VCD or RVH Confirmed by Davonna Belling [phone]) on 06/20/2022 1:56:35 PM  Radiology DG Chest Portable 1 View  Result Date: 06/20/2022 CLINICAL DATA:  Shortness of breath. EXAM: PORTABLE CHEST 1 VIEW COMPARISON:  Chest radiographs 12 6 findings 20 and 04/07/2018 FINDINGS: Cardiac silhouette is again mildly enlarged. Mild-to-moderate calcification within the aortic arch. Mildly decreased lung volumes  with mild bibasilar bronchovascular crowding. No pleural effusion or pneumothorax. No acute skeletal abnormality. IMPRESSION: Mildly decreased lung volumes with mild bibasilar bronchovascular crowding. No acute lung process. Electronically Signed   By: Yvonne Kendall M.D.   On: 06/20/2022 14:28    Procedures Procedures    Medications Ordered in ED Medications  furosemide (LASIX) injection 40 mg (has no administration in time range)    ED Course/ Medical Decision Making/ A&P  Medical Decision Making Amount and/or Complexity of Data Reviewed Labs: ordered. Radiology: ordered.  Risk Prescription drug management.   Shortness of breath with cough.  Differential diagnosis includes infection, CHF.  No fevers.  Will get chest x-ray and basic blood work.  Have reviewed previous PCP note.  Did have a history of pulmonary embolism and is on Eliquis.  Does have rales at the bases with edema on his legs which high risk for CHF.  Has BNP that is mildly elevated.  Does have some mild hypoxia at times.  Will give dose of IV Lasix and ambulate patient.  Not on oxygen at baseline.  Care turned over to Dr Pearline Cables.        Final Clinical Impression(s) / ED Diagnoses Final diagnoses:  Congestive heart failure, unspecified HF chronicity, unspecified heart failure type [facility])    Rx / DC Orders ED Discharge Orders     None         Davonna Belling, MD 06/20/22 1510

## 2022-06-20 NOTE — ED Triage Notes (Signed)
Pt from home with EMS with complaints of exertional SOB x2 weeks that has been getting worse. Pt reports his abdomen and bilateral LE's are a little more swollen than normal.   Denies pain. Endorses cough with green phlegm

## 2022-06-20 NOTE — ED Provider Notes (Signed)
  Provider Note MRN:  [id number]  Arrival date & time: 06/21/22    ED Course and Medical Decision Making  Assumed care from [place] at shift change.  See note from prior team for complete details, in brief:   Clinical Course as of 06/21/22 0013  Tue Jun 20, 2022  1531 82 yo m, dib, LE edema, cough. HTN. No hx CHF. Lasix, trial of ambulation after lasix, +/- discharge pending walk [SG]    Clinical Course User Index [SG] Wynona Dove A, DO    Patient is able to ambulate without difficulty, no hypoxia.  Feeling better after diuresis.  Mild elevation to BNP, appears to have mild volume overload on exam.  Again feeling better after diuresis.  Concern for possible CHF.  D/w treatment plan w/ pt and family at bedside, favor outpatient evaluation by cardiology, echocardiogram to evaluate for CHF.  No resp distress, ambulatory w/ steady gait w/o hypoxia. Will send home with short course of Lasix.  advised to return if symptoms worsen  The patient improved significantly and was discharged in stable condition. Detailed discussions were had with the patient regarding current findings, and need for close f/u with PCP or on call doctor. The patient has been instructed to return immediately if the symptoms worsen in any way for re-evaluation. Patient verbalized understanding and is in agreement with current care plan. All questions answered prior to discharge.    Procedures  Final Clinical Impressions(s) / ED Diagnoses     ICD-10-CM   1. Congestive heart failure, unspecified HF chronicity, unspecified heart failure type ([place])  I50.9 Ambulatory referral to Cardiology      ED Discharge Orders          Ordered    furosemide (LASIX) 20 MG tablet  Daily        06/20/22 2025    Ambulatory referral to Cardiology        06/20/22 2025              Discharge Instructions      It was a pleasure caring for you today in the emergency department.  Please return to the emergency department for  any worsening or worrisome symptoms.          Jeanell Sparrow, DO 06/21/22 [phone]

## 2022-06-20 NOTE — ED Notes (Addendum)
Pt ambulated around [place] with a little heavy breathing when we got back to his room. O2 was 94% the entire walk around the nursing station.

## 2022-06-20 NOTE — Discharge Instructions (Signed)
It was a pleasure caring for you today in the emergency department. ° °Please return to the emergency department for any worsening or worrisome symptoms. ° ° °

## 2022-07-25 ENCOUNTER — Encounter: Payer: Self-pay | Admitting: Interventional Cardiology

## 2022-07-25 ENCOUNTER — Ambulatory Visit: Payer: Medicare Other | Attending: Interventional Cardiology | Admitting: Interventional Cardiology

## 2022-07-25 VITALS — BP 166/58 | HR 53 | Ht 67.0 in | Wt 171.0 lb

## 2022-07-25 DIAGNOSIS — R7309 Other abnormal glucose: Secondary | ICD-10-CM | POA: Diagnosis present

## 2022-07-25 DIAGNOSIS — R600 Localized edema: Secondary | ICD-10-CM | POA: Insufficient documentation

## 2022-07-25 DIAGNOSIS — I7143 Infrarenal abdominal aortic aneurysm, without rupture: Secondary | ICD-10-CM | POA: Insufficient documentation

## 2022-07-25 DIAGNOSIS — R0609 Other forms of dyspnea: Secondary | ICD-10-CM | POA: Insufficient documentation

## 2022-07-25 DIAGNOSIS — I509 Heart failure, unspecified: Secondary | ICD-10-CM | POA: Insufficient documentation

## 2022-07-25 DIAGNOSIS — Z720 Tobacco use: Secondary | ICD-10-CM | POA: Insufficient documentation

## 2022-07-25 MED ORDER — FUROSEMIDE 40 MG PO TABS: 40.0000 mg | ORAL_TABLET | Freq: Every day | ORAL | 3 refills | Status: DC

## 2022-07-25 NOTE — Patient Instructions (Addendum)
Medication Instructions:  Your physician recommends that you continue on your current medications as directed. Please refer to the Current Medication list given to you today.  Furosemide is 40 mg daily   *If you need a refill on your cardiac medications before your next appointment, please call your pharmacy*   Lab Work: Lab work to be done today--BMP, BNP, A1C, Lipids If you have labs (blood work) drawn today and your tests are completely normal, you will receive your results only by: MyChart Message (if you have MyChart) OR A paper copy in the mail If you have any lab test that is abnormal or we need to change your treatment, we will call you to review the results.   Testing/Procedures: Your physician has requested that you have an echocardiogram. Echocardiography is a painless test that uses sound waves to create images of your heart. It provides your doctor with information about the size and shape of your heart and how well your heart's chambers and valves are working. This procedure takes approximately one hour. There are no restrictions for this procedure. Please do NOT wear cologne, perfume, aftershave, or lotions (deodorant is allowed). Please arrive 15 minutes prior to your appointment time.    Follow-Up: At [facility], you and your health needs are our priority.  As part of our continuing mission to provide you with exceptional heart care, we have created designated Provider Care Teams.  These Care Teams include your primary Cardiologist (physician) and Advanced Practice Providers (APPs -  Physician Assistants and Nurse Practitioners) who all work together to provide you with the care you need, when you need it.  We recommend signing up for the patient portal called "MyChart".  Sign up information is provided on this After Visit Summary.  MyChart is used to connect with patients for Virtual Visits (Telemedicine).  Patients are able to view lab/test results, encounter  notes, upcoming appointments, etc.  Non-urgent messages can be sent to your provider as well.   To learn more about what you can do with MyChart, go to [email or web address].    Your next appointment:   Based on results  Provider:   Lance Muss, MD     Other Instructions You have been referred to see the pharmacist in the hypertension clinic in our office.  Please schedule appointment for about 4 weeks from now.  Check BP at home and bring readings to this appointment.  Also bring your home BP cuff to this appointment

## 2022-07-25 NOTE — Progress Notes (Signed)
Cardiology Office Note   Date:  07/25/2022   ID:  Andrew Hunt, DOB 02/01/1941, MRN [id number]  PCP:  Andrew Inch, MD    No chief complaint on file.  CHF  Wt Readings from Last 3 Encounters:  07/25/22 171 lb (77.6 kg)  06/20/22 175 lb (79.4 kg)  01/09/22 164 lb (74.4 kg)       History of Present Illness: Andrew Hunt is a 82 y.o. male who is being seen today for the evaluation of congestive heart failure at the request of Andrew Inch, MD.   Retired Psychologist, occupational.  He has a history of hypertension.  He was seen in the emergency room in March 2024 with shortness of breath and a cough that lasted a few weeks.  He reported some swelling in his legs as well.  Mildly elevated BNP and mild hypoxia.  There is a concern for congestive heart failure.  He was treated with Lasix and sent home.  Still has some LE edema.  Wife' s higher dose of Lasix worked better.   AAA 4.3 in 10/23. 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    Past Medical History:  Diagnosis Date   AAA (abdominal aortic aneurysm)    Arthritis    GERD (gastroesophageal reflux disease)    Hyperlipidemia    Hypertension    Lung nodule    TIA (transient ischemic attack)     Past Surgical History:  Procedure Laterality Date   APPENDECTOMY     BACK SURGERY     CHOLECYSTECTOMY     COLONOSCOPY N/A 05/05/2014   Procedure: COLONOSCOPY;  Surgeon: Andrew Ade, MD;  Location: [facility];  Service: Endoscopy;  Laterality: N/A;  100   MULTIPLE TOOTH EXTRACTIONS       Current Outpatient Medications  Medication Sig Dispense Refill   acetaminophen (TYLENOL) 325 MG tablet Take 2 tablets (650 mg total) by mouth every 6 (six) hours as needed for mild pain, fever or headache (or Fever >/= 101). 30 tablet 1   amLODipine (NORVASC) 10 MG tablet Take 5 mg by mouth daily.      apixaban (ELIQUIS) 5 MG TABS tablet Take 1 tablet (5 mg total) by mouth 2 (two) times daily. Start on 01/272020 after completing the  starter pack 60 tablet 2   atorvastatin (LIPITOR) 40 MG tablet Take 40 mg by mouth daily.     furosemide (LASIX) 40 MG tablet Take 1 tablet (40 mg total) by mouth daily. 90 tablet 3   Glucosamine-Chondroit-Vit C-Mn (GLUCOSAMINE 1500 COMPLEX PO) Take 1,500 mg by mouth daily.     ibuprofen (ADVIL) 800 MG tablet Take 800 mg by mouth every 6 (six) hours as needed.     losartan (COZAAR) 100 MG tablet Take 100 mg by mouth daily.     nebivolol (BYSTOLIC) 10 MG tablet Take 1 tablet (10 mg total) by mouth daily. 30 tablet 5   omeprazole (PRILOSEC) 20 MG capsule Take 20 mg by mouth daily.     tamsulosin (FLOMAX) 0.4 MG CAPS capsule Take 0.4 mg by mouth at bedtime.      tiZANidine (ZANAFLEX) 4 MG tablet Take 2-4 mg by mouth at bedtime as needed.     ELIQUIS STARTER PACK (ELIQUIS STARTER PACK) 5 MG TABS Take as directed on package: start with two-5mg  tablets twice daily for 7 days. On day 8, switch to one-5mg  tablet twice daily. 1 each 0   methocarbamol (ROBAXIN) 500 MG tablet Take  1 tablet (500 mg total) by mouth 3 (three) times daily. (Patient not taking: Reported on 03/16/2019) 90 tablet 1   oxyCODONE (OXY IR/ROXICODONE) 5 MG immediate release tablet Take 1 tablet (5 mg total) by mouth every 6 (six) hours as needed for moderate pain or severe pain. 15 tablet 0   oxyCODONE-acetaminophen (PERCOCET/ROXICET) 5-325 MG tablet Take 1-2 tablets by mouth every 8 (eight) hours as needed for severe pain. 8 tablet 0   peg 3350 powder (MOVIPREP) 100 G SOLR Take 1 kit (200 g total) by mouth as directed. 1 kit 0   senna-docusate (SENOKOT-S) 8.6-50 MG tablet Take 2 tablets by mouth 2 (two) times daily. 120 tablet 2   varenicline (CHANTIX) 0.5 MG tablet Take 0.5 mg by mouth 2 (two) times daily. (Patient not taking: Reported on 09/29/2020)     No current facility-administered medications for this visit.    Allergies:   Hydrocodone, Oxycodone, and Vioxx [rofecoxib]    Social History:  The patient  reports that he has been  smoking cigarettes. He has a 15.50 pack-year smoking history. He has never used smokeless tobacco. He reports that he does not drink alcohol and does not use drugs.   Family History:  The patient's family history includes Diabetes Mellitus II in his mother; Kidney disease in his mother; Lung cancer in his sister.    ROS:  Please see the history of present illness.   Otherwise, review of systems are positive for low back pain, LE edema.   All other systems are reviewed and negative.    PHYSICAL EXAM: VS:  BP (!) 166/58   Pulse (!) 53   Ht  (1.702 m)   Wt 171 lb (77.6 kg)   SpO2 94%   BMI 26.78 kg/m  , BMI Body mass index is 26.78 kg/m. GEN: Well nourished, well developed, in no acute distress HEENT: normal Neck: no JVD, carotid bruits, or masses Cardiac: RRR; no murmurs, rubs, or gallops, bilateral LE pitting edema up to the knee  Respiratory:  clear to auscultation bilaterally, normal work of breathing GI: soft, nontender, nondistended, + BS MS: no deformity or atrophy Skin: warm and dry, no rash Neuro:  Strength and sensation are intact Psych: euthymic mood, full affect   EKG:   The ekg ordered 3/24 demonstrates sinus brady   Recent Labs: 06/20/2022: ALT 19; B Natriuretic Peptide 140.9; BUN 15; Creatinine, Ser 1.29; Hemoglobin 11.8; Platelets 174; Potassium 3.3; Sodium 139   Lipid Panel No results found for: "CHOL", "TRIG", "HDL", "CHOLHDL", "VLDL", "LDLCALC", "LDLDIRECT"   Other studies Reviewed: Additional studies/ records that were reviewed today with results demonstrating: Emergency room records reviewed.   ASSESSMENT AND PLAN:  Congestive heart failure: Unspecified.  Check echocardiogram.  BP elevated.  Based on the kidney function and potassium, can decide on how best to change blood pressure medicines check her blood pressure at home after resting for 5 to 10 minutes and being relaxed.  Will have him follow-up in our Pharm.D. hypertension clinic for further  medicine titration in the setting of either systolic or diastolic heart failure. Tobacco abuse: He is not interested in quitting. DOE: long time smoker. No diagnosis of COPD.  No inhaler use. LE edema:  He took some of his wife's Lasix.   40 mg worked better than the 20 mg tab.  Will give him a prescription for Lasix 40 mg daily.   Check renal function and electrolytes today as he has been taking Lasix since his ER  visit.  Minimize salt diet.  Elevate legs.  Could also consider compression but hopefully, with Lasix and leg elevation, swelling will improve. ZOX:WRUEAV 4.3 cm in 2023. Coronary artery calcifications: Noted on prior CT scan.  No angina at this time.  Continue atorvastatin 40 mg daily.  Will check LDL today as it is not on record several years. PreDM: A1C 5.9 several years ago. Recheck today.  LDL 73 in 2018.     Current medicines are reviewed at length with the patient today.  The patient concerns regarding his medicines were addressed.  The following changes have been made:  No change  Labs/ tests ordered today include:   Orders Placed This Encounter  Procedures   Basic Metabolic Panel (BMET)   Pro b natriuretic peptide   HgB A1c   Lipid Profile   AMB Referral to [facility] Pharm-[facility]   ECHOCARDIOGRAM COMPLETE    Recommend 150 minutes/week of aerobic exercise Low fat, low carb, high fiber diet recommended  Disposition:   FU in 4-6 weeks   Signed, Lance Muss, MD  07/25/2022 9:41 AM    [facility] [address] [place], [place], [place]  [address] Phone: [phone]; Fax: [phone]

## 2022-07-26 LAB — LIPID PANEL
Chol/HDL Ratio: 4.5 ratio (ref 0.0–5.0)
Cholesterol, Total: 143 mg/dL (ref 100–199)
HDL: 32 mg/dL — ABNORMAL LOW (ref >39)
LDL Chol Calc (NIH): 90 mg/dL (ref 0–99)
Triglycerides: 117 mg/dL (ref 0–149)
VLDL Cholesterol Cal: 21 mg/dL (ref 5–40)

## 2022-07-26 LAB — HEMOGLOBIN A1C
Est. average glucose Bld gHb Est-mCnc: 128 mg/dL
Hgb A1c MFr Bld: 6.1 % — ABNORMAL HIGH (ref 4.8–5.6)

## 2022-07-26 LAB — BASIC METABOLIC PANEL
BUN/Creatinine Ratio: 15 (ref 10–24)
BUN: 20 mg/dL (ref 8–27)
CO2: 19 mmol/L — ABNORMAL LOW (ref 20–29)
Calcium: 8.8 mg/dL (ref 8.6–10.2)
Chloride: 102 mmol/L (ref 96–106)
Creatinine, Ser: 1.3 mg/dL — ABNORMAL HIGH (ref 0.76–1.27)
Glucose: 103 mg/dL — ABNORMAL HIGH (ref 70–99)
Potassium: 4.2 mmol/L (ref 3.5–5.2)
Sodium: 140 mmol/L (ref 134–144)
eGFR: 55 mL/min/{1.73_m2} — ABNORMAL LOW (ref >59)

## 2022-07-26 LAB — PRO B NATRIURETIC PEPTIDE: NT-Pro BNP: 444 pg/mL (ref 0–486)

## 2022-08-24 ENCOUNTER — Ambulatory Visit (INDEPENDENT_AMBULATORY_CARE_PROVIDER_SITE_OTHER): Payer: Medicare Other | Admitting: Pharmacist

## 2022-08-24 ENCOUNTER — Ambulatory Visit (HOSPITAL_COMMUNITY): Payer: Medicare Other | Attending: Cardiology

## 2022-08-24 VITALS — BP 130/50 | HR 53

## 2022-08-24 DIAGNOSIS — Z720 Tobacco use: Secondary | ICD-10-CM | POA: Insufficient documentation

## 2022-08-24 DIAGNOSIS — R600 Localized edema: Secondary | ICD-10-CM | POA: Diagnosis present

## 2022-08-24 DIAGNOSIS — I10 Essential (primary) hypertension: Secondary | ICD-10-CM | POA: Diagnosis present

## 2022-08-24 DIAGNOSIS — R0609 Other forms of dyspnea: Secondary | ICD-10-CM | POA: Insufficient documentation

## 2022-08-24 DIAGNOSIS — I509 Heart failure, unspecified: Secondary | ICD-10-CM | POA: Insufficient documentation

## 2022-08-24 DIAGNOSIS — I7143 Infrarenal abdominal aortic aneurysm, without rupture: Secondary | ICD-10-CM | POA: Insufficient documentation

## 2022-08-24 LAB — ECHOCARDIOGRAM COMPLETE
Area-P 1/2: 3.2 cm2
S' Lateral: 2.7 cm

## 2022-08-24 NOTE — Progress Notes (Signed)
Patient ID: Andrew Hunt                 DOB: July 08, 1940                      MRN: [id number]      HPI: Andrew Hunt is a 82 y.o. male referred by Dr. Eldridge Dace to HTN clinic. PMH is significant for HTN, CHF, TIA, tobacco abuse, DVT, AAA and prediabetes. Seen by Dr. Eldridge Dace in April after ER visit for SOB and cough. Awaiting echo results.  Furosemide was increased to 40mg  daily. BP at Dr. Eldridge Dace office 166/58. He saw his PCP on 08/07/22 and BP was 136/70.   Patient presents today a accompanied by his daughter.  He just had his echo done.  Reports that he does sometimes check his blood pressure at home but has not checked it in about a month.  Patient denies any dizziness, lightheadedness or headaches.  Reports that his welling is better but he does have some swelling in the evening that improves when he wakes up in the morning.  On exam he has trace swelling in the left leg and mild swelling in right leg.  Has been taking furosemide 40 mg daily.  Reports some muscle cramps.  Has only had to take 1 extra furosemide.  He does not get short of breath with regular activities, but will get short of breath when walking distances.  He sleeps with 1 pillow, reports when he lies down he feels short of breath for about a minute and then it goes away.  Current HTN meds: amlodipine 10mg  daily, furosemide 40mg  daily, losartan 100mg  daily, nebivolol 10mg  daily Previously tried: valsartan,  BP goal: <130/80  Family History:  Family History  Problem Relation Age of Onset   Diabetes Mellitus II Mother    Kidney disease Mother    Lung cancer Sister      Social History:  Social History   Socioeconomic History   Marital status: Married    Spouse name: Not on file   Number of children: Not on file   Years of education: Not on file   Highest education level: Not on file  Occupational History   Not on file  Tobacco Use   Smoking status: Every Day    Packs/day: 0.25    Years: 62.00     Additional pack years: 0.00    Total pack years: 15.50    Types: Cigarettes   Smokeless tobacco: Never   Tobacco comments:    Approximately 1/3 pack per day currently  Vaping Use   Vaping Use: Never used  Substance and Sexual Activity   Alcohol use: No    Alcohol/week: 0.0 standard drinks of alcohol   Drug use: No   Sexual activity: Not Currently  Other Topics Concern   Not on file  Social History Narrative   Right handed one level home   Social Determinants of Health   Financial Resource Strain: Not on file  Food Insecurity: Not on file  Transportation Needs: Not on file  Physical Activity: Not on file  Stress: Not on file  Social Connections: Not on file  Intimate Partner Violence: Not on file    Diet: Not discussed in detail  Exercise: Works in the garden in the yard.  Constantly working on things.  Home BP readings: No readings available   Wt Readings from Last 3 Encounters:  07/25/22 171 lb (77.6 kg)  06/20/22 175 lb (79.4  kg)  01/09/22 164 lb (74.4 kg)   BP Readings from Last 3 Encounters:  08/24/22 (!) 130/50  07/25/22 (!) 166/58  06/20/22 (!) 168/79   Pulse Readings from Last 3 Encounters:  08/24/22 (!) 53  07/25/22 (!) 53  06/20/22 (!) 52    Renal function: CrCl cannot be calculated (Patient's most recent lab result is older than the maximum 21 days allowed.).  Past Medical History:  Diagnosis Date   AAA (abdominal aortic aneurysm) ([facility])    Arthritis    GERD (gastroesophageal reflux disease)    Hyperlipidemia    Hypertension    Lung nodule    TIA (transient ischemic attack)     Current Outpatient Medications on File Prior to Visit  Medication Sig Dispense Refill   acetaminophen (TYLENOL) 325 MG tablet Take 2 tablets (650 mg total) by mouth every 6 (six) hours as needed for mild pain, fever or headache (or Fever >/= 101). 30 tablet 1   amLODipine (NORVASC) 10 MG tablet Take 10 mg by mouth daily.     apixaban (ELIQUIS) 5 MG TABS tablet Take  1 tablet (5 mg total) by mouth 2 (two) times daily. Start on 01/272020 after completing the starter pack 60 tablet 2   atorvastatin (LIPITOR) 40 MG tablet Take 40 mg by mouth daily.     furosemide (LASIX) 40 MG tablet Take 1 tablet (40 mg total) by mouth daily. 90 tablet 3   Glucosamine-Chondroit-Vit C-Mn (GLUCOSAMINE 1500 COMPLEX PO) Take 1,500 mg by mouth daily.     ibuprofen (ADVIL) 800 MG tablet Take 800 mg by mouth every 6 (six) hours as needed.     losartan (COZAAR) 100 MG tablet Take 100 mg by mouth daily.     nebivolol (BYSTOLIC) 10 MG tablet Take 1 tablet (10 mg total) by mouth daily. 30 tablet 5   omeprazole (PRILOSEC) 20 MG capsule Take 20 mg by mouth daily.     tamsulosin (FLOMAX) 0.4 MG CAPS capsule Take 0.4 mg by mouth at bedtime.      tiZANidine (ZANAFLEX) 4 MG tablet Take 2-4 mg by mouth at bedtime as needed.     No current facility-administered medications on file prior to visit.    Allergies  Allergen Reactions   Hydrocodone Itching   Oxycodone Itching   Vioxx [Rofecoxib] Swelling and Other (See Comments)    Caused mini-strokes    Blood pressure (!) 130/50, pulse (!) 53.   Assessment/Plan:     1. Hypertension -  Hypertension Assessment: Blood pressure at goal in clinic today Unsure about home readings, the 1 reading he did report was above goal Awaiting results from echo to determine if he has systolic or diastolic heart failure Recommended that he start wearing compression stockings 20 to 30 mm per mercury calf height  Plan: For now continue with amlodipine 10 mg daily furosemide 40 mg daily, losartan 100 mg daily and nebivolol 10 mg daily I anticipate once his echo is read some medication changes will need to be made-if he is found to have a reduced ejection fraction his nebivolol should be changed to carvedilol and the addition of spironolactone would be beneficial I have asked patient to start checking his blood pressure at home Follow-up in office in 3  weeks with his blood pressure readings   Thank you  Olene Floss, Pharm.D, BCPS, CPP [facility] [address] [place], [place] [address]  Phone: [phone]; Fax: [phone]     

## 2022-08-24 NOTE — Patient Instructions (Signed)
Summary of today's discussion  1.Please start checking blood pressure at home  2. For now, continue amlodipine 10mg  daily, furosemide 40mg  daily, losartan 100mg  daily, nebivolol 10mg  daily  3.  4.  5.   Your blood pressure goal is <130/80  To check your pressure at home you will need to:  1. Sit up in a chair, with feet flat on the floor and back supported. Do not cross your ankles or legs. 2. Rest your left arm so that the cuff is about heart level. If the cuff goes on your upper arm,  then just relax the arm on the table, arm of the chair or your lap. If you have a wrist cuff, we  suggest relaxing your wrist against your chest (think of it as Pledging the Flag with the  wrong arm).  3. Place the cuff snugly around your arm, about 1 inch above the crook of your elbow. The  cords should be inside the groove of your elbow.  4. Sit quietly, with the cuff in place, for about 5 minutes. After that 5 minutes press the power  button to start a reading. 5. Do not talk or move while the reading is taking place.  6. Record your readings on a sheet of paper. Although most cuffs have a memory, it is often  easier to see a pattern developing when the numbers are all in front of you.  7. You can repeat the reading after 1-3 minutes if it is recommended  Make sure your bladder is empty and you have not had caffeine or tobacco within the last 30 min  Always bring your blood pressure log with you to your appointments. If you have not brought your monitor in to be double checked for accuracy, please bring it to your next appointment.  You can find a list of validated (accurate) blood pressure cuffs at [email or web address]   Important lifestyle changes to control high blood pressure  Intervention  Effect on the BP  Lose extra pounds and watch your waistline Weight loss is one of the most effective lifestyle changes for controlling blood pressure. If you're overweight or obese, losing even a small amount  of weight can help reduce blood pressure. Blood pressure might go down by about 1 millimeter of mercury (mm Hg) with each kilogram (about 2.2 pounds) of weight lost.  Exercise regularly As a general goal, aim for at least 30 minutes of moderate physical activity every day. Regular physical activity can lower high blood pressure by about 5 to 8 mm Hg.  Eat a healthy diet Eating a diet rich in whole grains, fruits, vegetables, and low-fat dairy products and low in saturated fat and cholesterol. A healthy diet can lower high blood pressure by up to 11 mm Hg.  Reduce salt (sodium) in your diet Even a small reduction of sodium in the diet can improve heart health and reduce high blood pressure by about 5 to 6 mm Hg.  Limit alcohol One drink equals 12 ounces of beer, 5 ounces of wine, or 1.5 ounces of 80-proof liquor.  Limiting alcohol to less than one drink a day for women or two drinks a day for men can help lower blood pressure by about 4 mm Hg.   Please call me at [phone] with any questions.

## 2022-08-24 NOTE — Assessment & Plan Note (Signed)
Assessment: Blood pressure at goal in clinic today Unsure about home readings, the 1 reading he did report was above goal Awaiting results from echo to determine if he has systolic or diastolic heart failure Recommended that he start wearing compression stockings 20 to 30 mm per mercury calf height  Plan: For now continue with amlodipine 10 mg daily furosemide 40 mg daily, losartan 100 mg daily and nebivolol 10 mg daily I anticipate once his echo is read some medication changes will need to be made-if he is found to have a reduced ejection fraction his nebivolol should be changed to carvedilol and the addition of spironolactone would be beneficial I have asked patient to start checking his blood pressure at home Follow-up in office in 3 weeks with his blood pressure readings

## 2022-08-25 ENCOUNTER — Telehealth: Payer: Self-pay | Admitting: Pharmacist

## 2022-08-25 LAB — BASIC METABOLIC PANEL
BUN/Creatinine Ratio: 13 (ref 10–24)
BUN: 16 mg/dL (ref 8–27)
CO2: 23 mmol/L (ref 20–29)
Calcium: 8.5 mg/dL — ABNORMAL LOW (ref 8.6–10.2)
Chloride: 104 mmol/L (ref 96–106)
Creatinine, Ser: 1.21 mg/dL (ref 0.76–1.27)
Glucose: 86 mg/dL (ref 70–99)
Potassium: 4.2 mmol/L (ref 3.5–5.2)
Sodium: 140 mmol/L (ref 134–144)
eGFR: 60 mL/min/{1.73_m2} (ref >59)

## 2022-08-25 LAB — MAGNESIUM: Magnesium: 1.2 mg/dL — ABNORMAL LOW (ref 1.6–2.3)

## 2022-08-25 MED ORDER — MAGNESIUM OXIDE 400 MG PO TABS: 400.0000 mg | ORAL_TABLET | Freq: Two times a day (BID) | ORAL | 3 refills | Status: DC

## 2022-08-25 NOTE — Telephone Encounter (Signed)
Scr and K are WNL. MAg low. Will start magox 400mg  BID. Pt made aware.

## 2022-09-13 NOTE — Progress Notes (Unsigned)
Patient ID: Andrew Hunt                 DOB: [birth year]                      MRN: [id number]      HPI: Andrew Hunt is a 82 y.o. male referred by Dr. Eldridge Dace to HTN clinic. PMH is significant for HTN, CHF, TIA, tobacco abuse, DVT, AAA and prediabetes. Seen by Dr. Eldridge Dace in April after ER visit for SOB and cough. Awaiting echo results.  Furosemide was increased to 40mg  daily. BP at Dr. Eldridge Dace office 166/58. He saw his PCP on 08/07/22 and BP was 136/70. At last visit with pharmacist there was no medication change as his BP was at goal. Echo -08/24/2022, was normal "Normal LV/RV function.  Overall, adequate valvular function."  Patient presents today a accompanied by his daughter.  He brought in BP log and home BP monitor. Home monitor validated found out to be inaccurate it reads 20-30 higher than office cuff. Home BP ~140-160/80 heart rate in low 50's. He has cut down on salt significantly ( cook with half amount of salt and does not use salt shaker on cooked food). He still smokes ~8 cig per day has been cutting down. Active around the house and does lot of yard work.Denies swelling (only use 2nd dose of Lasix in the evening 3 times since he saw [place] last) no SOB except on exertion, no dizziness, palpitation, chest pain.  Current HTN meds: amlodipine 10mg  daily, furosemide 40mg  daily, losartan 100mg  daily, nebivolol 10mg  daily Previously tried: valsartan- d/c due to contamination recall  BP goal: <130/80  Family History:  Family History  Problem Relation Age of Onset   Diabetes Mellitus II Mother    Kidney disease Mother    Lung cancer Sister      Social History:  Social History   Socioeconomic History   Marital status: Married    Spouse name: Not on file   Number of children: Not on file   Years of education: Not on file   Highest education level: Not on file  Occupational History   Not on file  Tobacco Use   Smoking status: Every Day    Packs/day: 0.25    Years: 62.00     Additional pack years: 0.00    Total pack years: 15.50    Types: Cigarettes   Smokeless tobacco: Never   Tobacco comments:    Approximately 1/3 pack per day currently  Vaping Use   Vaping Use: Never used  Substance and Sexual Activity   Alcohol use: No    Alcohol/week: 0.0 standard drinks of alcohol   Drug use: No   Sexual activity: Not Currently  Other Topics Concern   Not on file  Social History Narrative   Right handed one level home   Social Determinants of Health   Financial Resource Strain: Not on file  Food Insecurity: Not on file  Transportation Needs: Not on file  Physical Activity: Not on file  Stress: Not on file  Social Connections: Not on file  Intimate Partner Violence: Not on file    Diet: Not discussed in detail  Exercise: Works in the garden in the yard.  Constantly working on things.  Home BP readings: No readings available   Wt Readings from Last 3 Encounters:  07/25/22 171 lb (77.6 kg)  06/20/22 175 lb (79.4 kg)  01/09/22 164 lb (74.4 kg)  BP Readings from Last 3 Encounters:  09/14/22 128/69  08/24/22 (!) 130/50  07/25/22 (!) 166/58   Pulse Readings from Last 3 Encounters:  09/14/22 (!) 46  08/24/22 (!) 53  07/25/22 (!) 53    Renal function: CrCl cannot be calculated (Unknown ideal weight.).  Past Medical History:  Diagnosis Date   AAA (abdominal aortic aneurysm) (HCC)    Arthritis    GERD (gastroesophageal reflux disease)    Hyperlipidemia    Hypertension    Lung nodule    TIA (transient ischemic attack)     Current Outpatient Medications on File Prior to Visit  Medication Sig Dispense Refill   acetaminophen (TYLENOL) 325 MG tablet Take 2 tablets (650 mg total) by mouth every 6 (six) hours as needed for mild pain, fever or headache (or Fever >/= 101). 30 tablet 1   amLODipine (NORVASC) 10 MG tablet Take 10 mg by mouth daily.     apixaban (ELIQUIS) 5 MG TABS tablet Take 1 tablet (5 mg total) by mouth 2 (two) times daily.  Start on 01/272020 after completing the starter pack 60 tablet 2   atorvastatin (LIPITOR) 40 MG tablet Take 40 mg by mouth daily.     furosemide (LASIX) 40 MG tablet Take 1 tablet (40 mg total) by mouth daily. 90 tablet 3   Glucosamine-Chondroit-Vit C-Mn (GLUCOSAMINE 1500 COMPLEX PO) Take 1,500 mg by mouth daily.     losartan (COZAAR) 100 MG tablet Take 100 mg by mouth daily.     magnesium oxide (MAG-OX) 400 MG tablet Take 1 tablet (400 mg total) by mouth 2 (two) times daily. 180 tablet 3   nebivolol (BYSTOLIC) 10 MG tablet Take 1 tablet (10 mg total) by mouth daily. 30 tablet 5   omeprazole (PRILOSEC) 20 MG capsule Take 20 mg by mouth daily.     tamsulosin (FLOMAX) 0.4 MG CAPS capsule Take 0.4 mg by mouth at bedtime.      tiZANidine (ZANAFLEX) 4 MG tablet Take 2-4 mg by mouth at bedtime as needed.     ibuprofen (ADVIL) 800 MG tablet Take 800 mg by mouth every 6 (six) hours as needed. (Patient not taking: Reported on 09/14/2022)     No current facility-administered medications on file prior to visit.    Allergies  Allergen Reactions   Hydrocodone Itching   Oxycodone Itching   Vioxx [Rofecoxib] Swelling and Other (See Comments)    Caused mini-strokes    Blood pressure 128/69, pulse (!) 46, SpO2 98 %.   Assessment/Plan:     1. Hypertension -  Hypertension Assessment: BP is controlled in office BP 128/69 heart rate 46 (goal <130/80) Home BP monitor is around 82 years old does not read accurately; (SBP 20-30 points higher than office readings on home monitor) Tolerates current BP well without any side effects Denies SOB, palpitation, chest pain, headaches,or swelling Reduced salt intake and stays active around the house    Plan:  Given at goal BP not making any medication changes today  Continue taking amlodipine 10mg  daily, furosemide 40mg  daily, losartan 100mg  daily, nebivolol 10mg  daily Patient to keep record of BP readings with heart rate and report to [place] at the next  visit Patient to bring in new BP monitor for validation at next OV Patient to see PharmD in 3 weeks for follow up      Thank you  Olene Floss, Pharm.D, BCPS, CPP [facility] A Division of [facility] [address], [place],  [place] [address]  Phone: [phone]; Fax: [phone]

## 2022-09-14 ENCOUNTER — Ambulatory Visit: Payer: Medicare Other | Attending: Cardiology | Admitting: Student

## 2022-09-14 VITALS — BP 128/69 | HR 46

## 2022-09-14 DIAGNOSIS — I10 Essential (primary) hypertension: Secondary | ICD-10-CM | POA: Diagnosis not present

## 2022-09-14 NOTE — Patient Instructions (Addendum)
No medication changes made by your pharmacist Neno Hohensee, PharmD at today's visit:     Bring all of your meds, your BP cuff and your record of home blood pressures to your next appointment.    HOW TO TAKE YOUR BLOOD PRESSURE AT HOME  Rest 5 minutes before taking your blood pressure.  Don't smoke or drink caffeinated beverages for at least 30 minutes before. Take your blood pressure before (not after) you eat. Sit comfortably with your back supported and both feet on the floor (don't cross your legs). Elevate your arm to heart level on a table or a desk. Use the proper sized cuff. It should fit smoothly and snugly around your bare upper arm. There should be enough room to slip a fingertip under the cuff. The bottom edge of the cuff should be 1 inch above the crease of the elbow. Ideally, take 3 measurements at one sitting and record the average.  Important lifestyle changes to control high blood pressure  Intervention  Effect on the BP  Lose extra pounds and watch your waistline Weight loss is one of the most effective lifestyle changes for controlling blood pressure. If you're overweight or obese, losing even a small amount of weight can help reduce blood pressure. Blood pressure might go down by about 1 millimeter of mercury (mm Hg) with each kilogram (about 2.2 pounds) of weight lost.  Exercise regularly As a general goal, aim for at least 30 minutes of moderate physical activity every day. Regular physical activity can lower high blood pressure by about 5 to 8 mm Hg.  Eat a healthy diet Eating a diet rich in whole grains, fruits, vegetables, and low-fat dairy products and low in saturated fat and cholesterol. A healthy diet can lower high blood pressure by up to 11 mm Hg.  Reduce salt (sodium) in your diet Even a small reduction of sodium in the diet can improve heart health and reduce high blood pressure by about 5 to 6 mm Hg.  Limit alcohol One drink equals 12 ounces of beer, 5  ounces of wine, or 1.5 ounces of 80-proof liquor.  Limiting alcohol to less than one drink a day for women or two drinks a day for men can help lower blood pressure by about 4 mm Hg.   If you have any questions or concerns please use My Chart to send questions or call the office at [phone]  

## 2022-09-14 NOTE — Assessment & Plan Note (Signed)
Assessment: BP is controlled in office BP 128/69 heart rate 46 (goal <130/80) Home BP monitor is around 82 years old does not read accurately; (SBP 20-30 points higher than office readings on home monitor) Tolerates current BP well without any side effects Denies SOB, palpitation, chest pain, headaches,or swelling Reduced salt intake and stays active around the house    Plan:  Given at goal BP not making any medication changes today  Continue taking amlodipine 10mg  daily, furosemide 40mg  daily, losartan 100mg  daily, nebivolol 10mg  daily Patient to keep record of BP readings with heart rate and report to [place] at the next visit Patient to bring in new BP monitor for validation at next OV Patient to see PharmD in 3 weeks for follow up

## 2022-10-09 ENCOUNTER — Ambulatory Visit: Payer: Medicare Other

## 2022-10-30 ENCOUNTER — Ambulatory Visit: Payer: Medicare Other | Attending: Cardiovascular Disease | Admitting: Pharmacist

## 2022-10-30 VITALS — BP 162/64 | HR 58

## 2022-10-30 DIAGNOSIS — I10 Essential (primary) hypertension: Secondary | ICD-10-CM | POA: Insufficient documentation

## 2022-10-30 MED ORDER — TELMISARTAN 80 MG PO TABS: 80.0000 mg | ORAL_TABLET | Freq: Every day | ORAL | 3 refills | Status: DC

## 2022-10-30 MED ORDER — NEBIVOLOL HCL 5 MG PO TABS: 5.0000 mg | ORAL_TABLET | Freq: Every day | ORAL | 3 refills | Status: DC

## 2022-10-30 NOTE — Patient Instructions (Signed)
Your blood pressure goal is < 130/77mmHg  Decrease your Bystolic (nebivolol) from 10mg  to 5mg  once daily  Stop taking losartan  Start taking telmisartan 80mg  - 1 tablet daily  Continue to monitor and record your blood pressure at home  Bring in your home blood pressure cuff and readings to your next visit in 1 month

## 2022-10-30 NOTE — Progress Notes (Signed)
Patient ID: Andrew Hunt                 DOB: 07-23-1940                      MRN: [id number]     HPI: Andrew Hunt is a 82 y.o. male referred by Dr. Eldridge Dace to HTN clinic. PMH is significant for CHF, HTN, HLD, coronary artery calcifications noted on prior CT scan, DVT/PE, AAA, TIA, pre-diabetes, and tobacco use. Last seen by Dr Eldridge Dace in April after ER visit for SOB and cough. Furosemide was increased to 40mg  daily and updated echo was checked on 08/24/22 showing normal LV/RV function, EF of 60-65%.  BP was well controlled at 5/16 PharmD appt. Pt reported cramping, Mg was found to be low at 1.2 and he was started on supplementation. BP well controlled at 6/6 PharmD follow up however home cuff brought to that appt was measuring 20-28mmHg higher than clinic cuff. He was advised to purchase new cuff and presents today for follow up.  Takes all his BP meds in the morning around 7-8am. Tolerating them well. Denies dizziness, blurred vision, headache, and LE edema. Rarely has to take a second Lasix dose. Has some cramping when he does, but tolerates 1 Lasix daily well. Smoking about 8 cigarettes a day still. His wife is currently in the hospital, doing better, but understandably has been stressed with this. Brings in a detailed list of BP readings today that are noted below, but not his BP cuff. He did purchase a new one since his last visit.  Current HTN meds:  Amlodipine 10mg  daily - AM Losartan 100mg  daily - AM Bystolic 10mg  daily - AM Furosemide 40mg  daily - AM  BP goal: <130/31mmHg  Family History: Diabetes Mellitus II in his mother; Kidney disease in his mother; Lung cancer in his sister.   Social History: The patient  reports that he has been smoking cigarettes. He has a 15.50 pack-year smoking history. He has never used smokeless tobacco. He reports that he does not drink alcohol and does not use drugs.   Diet: Has cut back on salt significantly  Exercise: Stays active with yard  work  Home BP readings:    Labs:  08/24/22: SCr 1.21, Na 140, K 4.2  Wt Readings from Last 3 Encounters:  07/25/22 171 lb (77.6 kg)  06/20/22 175 lb (79.4 kg)  01/09/22 164 lb (74.4 kg)   BP Readings from Last 3 Encounters:  09/14/22 128/69  08/24/22 (!) 130/50  07/25/22 (!) 166/58   Pulse Readings from Last 3 Encounters:  09/14/22 (!) 46  08/24/22 (!) 53  07/25/22 (!) 53    Renal function: CrCl cannot be calculated (Patient's most recent lab result is older than the maximum 21 days allowed.).  Past Medical History:  Diagnosis Date   AAA (abdominal aortic aneurysm) (HCC)    Arthritis    GERD (gastroesophageal reflux disease)    Hyperlipidemia    Hypertension    Lung nodule    TIA (transient ischemic attack)     Current Outpatient Medications on File Prior to Visit  Medication Sig Dispense Refill   acetaminophen (TYLENOL) 325 MG tablet Take 2 tablets (650 mg total) by mouth every 6 (six) hours as needed for mild pain, fever or headache (or Fever >/= 101). 30 tablet 1   amLODipine (NORVASC) 10 MG tablet Take 10 mg by mouth daily.     apixaban (ELIQUIS) 5 MG  TABS tablet Take 1 tablet (5 mg total) by mouth 2 (two) times daily. Start on 01/272020 after completing the starter pack 60 tablet 2   atorvastatin (LIPITOR) 40 MG tablet Take 40 mg by mouth daily.     furosemide (LASIX) 40 MG tablet Take 1 tablet (40 mg total) by mouth daily. 90 tablet 3   Glucosamine-Chondroit-Vit C-Mn (GLUCOSAMINE 1500 COMPLEX PO) Take 1,500 mg by mouth daily.     ibuprofen (ADVIL) 800 MG tablet Take 800 mg by mouth every 6 (six) hours as needed. (Patient not taking: Reported on 09/14/2022)     losartan (COZAAR) 100 MG tablet Take 100 mg by mouth daily.     magnesium oxide (MAG-OX) 400 MG tablet Take 1 tablet (400 mg total) by mouth 2 (two) times daily. 180 tablet 3   nebivolol (BYSTOLIC) 10 MG tablet Take 1 tablet (10 mg total) by mouth daily. 30 tablet 5   omeprazole (PRILOSEC) 20 MG capsule Take  20 mg by mouth daily.     tamsulosin (FLOMAX) 0.4 MG CAPS capsule Take 0.4 mg by mouth at bedtime.      tiZANidine (ZANAFLEX) 4 MG tablet Take 2-4 mg by mouth at bedtime as needed.     No current facility-administered medications on file prior to visit.    Allergies  Allergen Reactions   Hydrocodone Itching   Oxycodone Itching   Vioxx [Rofecoxib] Swelling and Other (See Comments)    Caused mini-strokes     Assessment/Plan:  1. Hypertension - BP is elevated in clinic today, higher than normal, likely secondary to stress of his wife currently being hospitalized. Home BP readings have fluctuated a bit, some at goal < 130/12mmHg and some a bit elevated. Will decrease his Bystolic from 10mg  to 5mg  daily since his home HR has been in the 40s frequently. Will change his losartan 100mg  to telmisartan 80mg  daily for better BP lowering. Will continue amlodipine 10mg  daily and furosemide 40mg  daily. F/u in 4 weeks for BP check and BMET. Pt will bring in home log of readings and new BP cuff at that visit to verify accuracy.  Andrew Hunt Andrew Hunt, PharmD, BCACP, CPP [facility] [address], [place], [place] [address] Phone: [phone]; Fax: [phone] 10/30/2022 9:35 AM

## 2022-11-21 NOTE — Progress Notes (Deleted)
Patient ID: Andrew Hunt                 DOB: 12-May-1940                      MRN: [id number]     HPI: Andrew Hunt is a 82 y.o. male referred by Dr. Eldridge Dace to HTN clinic. PMH is significant for CHF, HTN, HLD, coronary artery calcifications noted on prior CT scan, DVT/PE, AAA, TIA, pre-diabetes, and tobacco use. Last seen by Dr Eldridge Dace in April after ER visit for SOB and cough. Furosemide was increased to 40mg  daily and updated echo was checked on 08/24/22 showing normal LV/RV function, EF of 60-65%.  BP was well controlled at 5/16 PharmD appt. Pt reported cramping, Mg was found to be low at 1.2 and he was started on supplementation. BP well controlled at 6/6 PharmD follow up however home cuff brought to that appt was measuring 20-51mmHg higher than clinic cuff. He was advised to purchase new cuff and presents today for follow up.  Last PharmD visit 10/30/22 pt reported taking all his BP meds in the morning around 7-8am. Rarely taking a second Lasix dose and experiencing some cramping when he does, but tolerating 1 Lasix daily well. Smoking about 8 cigarettes a day still. His wife was in the hospital and improving but understandably still causing him stress likely contributing to elevated BP (162/64). Some home BP readings at goal and some were still elevated. Pt had recently purchased new BP cuff he plans to bring to next visit for accuracy testing. Losartan 100 mg was switched to telmisartan 80 mg for better BP lowering. Bystolic was decreased from 10 mg to 5 mg due to some home heart rates in the 40s.  Current HTN meds:  Amlodipine 10mg  daily - AM Telmisartan 80mg  daily - AM Bystolic 5mg  daily - AM Furosemide 40mg  daily - AM  BP goal: <130/86mmHg  Family History: Diabetes Mellitus II in his mother; Kidney disease in his mother; Lung cancer in his sister.   Social History: The patient  reports that he has been smoking cigarettes. He has a 15.50 pack-year smoking history. He has never  used smokeless tobacco. He reports that he does not drink alcohol and does not use drugs.   Diet: Has cut back on salt significantly  Exercise: Stays active with yard work  Home BP readings:    Labs:  08/24/22: SCr 1.21, Na 140, K 4.2  Wt Readings from Last 3 Encounters:  07/25/22 171 lb (77.6 kg)  06/20/22 175 lb (79.4 kg)  01/09/22 164 lb (74.4 kg)   BP Readings from Last 3 Encounters:  10/30/22 (!) 162/64  09/14/22 128/69  08/24/22 (!) 130/50   Pulse Readings from Last 3 Encounters:  10/30/22 (!) 58  09/14/22 (!) 46  08/24/22 (!) 53    Renal function: CrCl cannot be calculated (Patient's most recent lab result is older than the maximum 21 days allowed.).  Past Medical History:  Diagnosis Date   AAA (abdominal aortic aneurysm) (HCC)    Arthritis    GERD (gastroesophageal reflux disease)    Hyperlipidemia    Hypertension    Lung nodule    TIA (transient ischemic attack)     Current Outpatient Medications on File Prior to Visit  Medication Sig Dispense Refill   acetaminophen (TYLENOL) 325 MG tablet Take 2 tablets (650 mg total) by mouth every 6 (six) hours as needed for mild pain, fever or  headache (or Fever >/= 101). 30 tablet 1   amLODipine (NORVASC) 10 MG tablet Take 10 mg by mouth daily.     apixaban (ELIQUIS) 5 MG TABS tablet Take 1 tablet (5 mg total) by mouth 2 (two) times daily. Start on 01/272020 after completing the starter pack 60 tablet 2   atorvastatin (LIPITOR) 40 MG tablet Take 40 mg by mouth daily.     furosemide (LASIX) 40 MG tablet Take 1 tablet (40 mg total) by mouth daily. 90 tablet 3   Glucosamine-Chondroit-Vit C-Mn (GLUCOSAMINE 1500 COMPLEX PO) Take 1,500 mg by mouth daily.     magnesium oxide (MAG-OX) 400 MG tablet Take 1 tablet (400 mg total) by mouth 2 (two) times daily. 180 tablet 3   nebivolol (BYSTOLIC) 5 MG tablet Take 1 tablet (5 mg total) by mouth daily. 90 tablet 3   omeprazole (PRILOSEC) 20 MG capsule Take 20 mg by mouth daily.      tamsulosin (FLOMAX) 0.4 MG CAPS capsule Take 0.4 mg by mouth at bedtime.      telmisartan (MICARDIS) 80 MG tablet Take 1 tablet (80 mg total) by mouth daily. 90 tablet 3   tiZANidine (ZANAFLEX) 4 MG tablet Take 2-4 mg by mouth at bedtime as needed.     No current facility-administered medications on file prior to visit.    Allergies  Allergen Reactions   Hydrocodone Itching   Oxycodone Itching   Vioxx [Rofecoxib] Swelling and Other (See Comments)    Caused mini-strokes     Assessment/Plan:  1. Hypertension - BP is elevated in clinic today, higher than normal, likely secondary to stress of his wife currently being hospitalized. Home BP readings have fluctuated a bit, some at goal < 130/50mmHg and some a bit elevated. Will decrease his Bystolic from 10mg  to 5mg  daily since his home HR has been in the 40s frequently. Will change his losartan 100mg  to telmisartan 80mg  daily for better BP lowering. Will continue amlodipine 10mg  daily and furosemide 40mg  daily. F/u in 4 weeks for BP check and BMET. Pt will bring in home log of readings and new BP cuff at that visit to verify accuracy.

## 2022-11-22 ENCOUNTER — Ambulatory Visit: Payer: Medicare Other

## 2022-11-27 ENCOUNTER — Ambulatory Visit: Payer: Medicare Other | Attending: Cardiovascular Disease | Admitting: Student

## 2022-11-27 ENCOUNTER — Encounter: Payer: Self-pay | Admitting: Student

## 2022-11-27 VITALS — BP 142/60 | HR 50 | Ht 66.0 in | Wt 173.6 lb

## 2022-11-27 DIAGNOSIS — I10 Essential (primary) hypertension: Secondary | ICD-10-CM | POA: Insufficient documentation

## 2022-11-27 NOTE — Patient Instructions (Addendum)
Changes made by your pharmacist Carmela Hurt, PharmD at today's visit: Will get lab today if everything looks ok will add diuretic call hydrochlorothiazide 12.5 mg daily to current blood pressure medications.Continue taking  Amlodipine 10mg  daily - AM Telmisartan 80mg  daily - AM Bystolic 5mg  daily - AM Furosemide 40mg  daily - AM  Bring all of your meds, your BP cuff and your record of home blood pressures to your next appointment.    HOW TO TAKE YOUR BLOOD PRESSURE AT HOME  Rest 5 minutes before taking your blood pressure.  Don't smoke or drink caffeinated beverages for at least 30 minutes before. Take your blood pressure before (not after) you eat. Sit comfortably with your back supported and both feet on the floor (don't cross your legs). Elevate your arm to heart level on a table or a desk. Use the proper sized cuff. It should fit smoothly and snugly around your bare upper arm. There should be enough room to slip a fingertip under the cuff. The bottom edge of the cuff should be 1 inch above the crease of the elbow. Ideally, take 3 measurements at one sitting and record the average.  Important lifestyle changes to control high blood pressure  Intervention  Effect on the BP  Lose extra pounds and watch your waistline Weight loss is one of the most effective lifestyle changes for controlling blood pressure. If you're overweight or obese, losing even a small amount of weight can help reduce blood pressure. Blood pressure might go down by about 1 millimeter of mercury (mm Hg) with each kilogram (about 2.2 pounds) of weight lost.  Exercise regularly As a general goal, aim for at least 30 minutes of moderate physical activity every day. Regular physical activity can lower high blood pressure by about 5 to 8 mm Hg.  Eat a healthy diet Eating a diet rich in whole grains, fruits, vegetables, and low-fat dairy products and low in saturated fat and cholesterol. A healthy diet can lower high blood  pressure by up to 11 mm Hg.  Reduce salt (sodium) in your diet Even a small reduction of sodium in the diet can improve heart health and reduce high blood pressure by about 5 to 6 mm Hg.  Limit alcohol One drink equals 12 ounces of beer, 5 ounces of wine, or 1.5 ounces of 80-proof liquor.  Limiting alcohol to less than one drink a day for women or two drinks a day for men can help lower blood pressure by about 4 mm Hg.   If you have any questions or concerns please use My Chart to send questions or call the office at [phone]

## 2022-11-27 NOTE — Progress Notes (Signed)
Patient ID: [name]                 DOB: 1941-02-19                      MRN: [id number]     HPI: Andrew Hunt is a 82 y.o. male referred by Dr. Eldridge Hunt to HTN clinic. PMH is significant for CHF, HTN, HLD, coronary artery calcifications noted on prior CT scan, DVT/PE, AAA, TIA, pre-diabetes, and tobacco use. Last seen by Dr Andrew Hunt in April after ER visit for SOB and cough. Furosemide was increased to 40mg  daily and updated echo was checked on 08/24/22 showing normal LV/RV function, EF of 60-65%.  BP was well controlled at 5/16 PharmD appt. Pt reported cramping, Mg was found to be low at 1.2 and he was started on supplementation. BP well controlled at 6/6 PharmD follow up however home cuff brought to that appt was measuring 20-8mmHg higher than clinic cuff. He was advised to purchase new cuff and presents today for follow up.  Last PharmD visit 10/30/22 pt reported taking all his BP meds in the morning around 7-8am. Rarely taking a second Lasix dose and experiencing some cramping when he does, but tolerating 1 Lasix daily well. Smoking about 8 cigarettes a day still. His wife was in the hospital and improving but understandably still causing him stress likely contributing to elevated BP (162/64). Some home BP readings at goal and some were still elevated. Pt had recently purchased new BP cuff he plans to bring to next visit for accuracy testing. Losartan 100 mg was switched to telmisartan 80 mg for better BP lowering. Bystolic was decreased from 10 mg to 5 mg due to some home heart rates in the 40s.  Today patient presented for hypertension follow up. Forgot to bring BP log, and BP monitor. Reports home BP is still up ~165/75 lowest reading in past 2 weeks 150/65 heart rate 56. Tolerating telmisartan well. Reports wife  went back in the hospital due to swelling and fluid retention, got discharged couple weeks ago but now she has singles so it has been stressful for him. He has been smoking  since he was 82 years old so it is hard for him to quit smoking. However he smokes lot less than he use to. 10 cigarettes  per day and willing to cut down to 8 cigarettes per day in next 4 weeks.  Current HTN meds:  Amlodipine 10mg  daily - AM Telmisartan 80mg  daily - AM Bystolic 5mg  daily - AM Furosemide 40mg  daily - AM  BP goal: <130/33mmHg  Family History: Diabetes Mellitus II in his mother; Kidney disease in his mother; Lung cancer in his sister.   Social History:   Tobacco Smoking: 10 cigarettes per day  Alcohol: none  Recreational drug use: none  Diet: does not eat can food and does not add salt to food, mainly eats home cooked meal   Exercise: Stays active with yard work, takes dog to walks 20 min at time 2-3 times per day   Home BP readings: ~165/75 lowest reading in past 2 weeks 150/65 heart rate 56.    Labs:  08/24/22: SCr 1.21, Na 140, K 4.2  Will check lab today after switching losartan to telmisartan   Wt Readings from Last 3 Encounters:  07/25/22 171 lb (77.6 kg)  06/20/22 175 lb (79.4 kg)  01/09/22 164 lb (74.4 kg)   BP Readings from Last 3 Encounters:  10/30/22 (!) 162/64  09/14/22 128/69  08/24/22 (!) 130/50   Pulse Readings from Last 3 Encounters:  10/30/22 (!) 58  09/14/22 (!) 46  08/24/22 (!) 53    Renal function: CrCl cannot be calculated (Patient's most recent lab result is older than the maximum 21 days allowed.).  Past Medical History:  Diagnosis Date   AAA (abdominal aortic aneurysm) (HCC)    Arthritis    GERD (gastroesophageal reflux disease)    Hyperlipidemia    Hypertension    Lung nodule    TIA (transient ischemic attack)     Current Outpatient Medications on File Prior to Visit  Medication Sig Dispense Refill   acetaminophen (TYLENOL) 325 MG tablet Take 2 tablets (650 mg total) by mouth every 6 (six) hours as needed for mild pain, fever or headache (or Fever >/= 101). 30 tablet 1   amLODipine (NORVASC) 10 MG tablet Take 10 mg by  mouth daily.     apixaban (ELIQUIS) 5 MG TABS tablet Take 1 tablet (5 mg total) by mouth 2 (two) times daily. Start on 01/272020 after completing the starter pack 60 tablet 2   atorvastatin (LIPITOR) 40 MG tablet Take 40 mg by mouth daily.     furosemide (LASIX) 40 MG tablet Take 1 tablet (40 mg total) by mouth daily. 90 tablet 3   Glucosamine-Chondroit-Vit C-Mn (GLUCOSAMINE 1500 COMPLEX PO) Take 1,500 mg by mouth daily.     magnesium oxide (MAG-OX) 400 MG tablet Take 1 tablet (400 mg total) by mouth 2 (two) times daily. 180 tablet 3   nebivolol (BYSTOLIC) 5 MG tablet Take 1 tablet (5 mg total) by mouth daily. 90 tablet 3   omeprazole (PRILOSEC) 20 MG capsule Take 20 mg by mouth daily.     tamsulosin (FLOMAX) 0.4 MG CAPS capsule Take 0.4 mg by mouth at bedtime.      telmisartan (MICARDIS) 80 MG tablet Take 1 tablet (80 mg total) by mouth daily. 90 tablet 3   tiZANidine (ZANAFLEX) 4 MG tablet Take 2-4 mg by mouth at bedtime as needed.     No current facility-administered medications on file prior to visit.    Allergies  Allergen Reactions   Hydrocodone Itching   Oxycodone Itching   Vioxx [Rofecoxib] Swelling and Other (See Comments)    Caused mini-strokes     Assessment/Plan:  1. Hypertension -  Assessment: BP is uncontrolled in office BP 142/60  mmHg heart rate 50 goal<130/80  Forgot to bring home BP monitor and log recalls his BP ~ 165/75 lowest reading in past 2 weeks 150/65 heart rate 56 Patient is taking and tolerating current BP medications well without any side effects Denies SOB, palpitation, chest pain, headaches,or swelling Follow low salt diet and walks regularly  Smokes 10 cigarettes per day willing to cut down to 8 per day in next 4 weeks   Plan:  Will get BMP today ( losartan was changed to long acting ARB last visit) magnesium was depleted from furosemide  If BMP WNL can consider low dose thiazide diuretic with close monitoring on lytes and renal function  Continue  taking Amlodipine 10mg  daily,Telmisartan 80mg  daily, Bystolic 5mg  daily, Furosemide 40mg  daily  Patient to keep record of BP readings with heart rate and report to [place] at the next visit and bring home monitor for validation  Patient to see PharmD in 4 weeks for follow up

## 2022-11-28 ENCOUNTER — Telehealth: Payer: Self-pay | Admitting: Pharmacist

## 2022-11-28 DIAGNOSIS — I10 Essential (primary) hypertension: Secondary | ICD-10-CM

## 2022-11-28 LAB — BASIC METABOLIC PANEL
BUN/Creatinine Ratio: 15 (ref 10–24)
BUN: 25 mg/dL (ref 8–27)
CO2: 20 mmol/L (ref 20–29)
Calcium: 9.1 mg/dL (ref 8.6–10.2)
Chloride: 101 mmol/L (ref 96–106)
Creatinine, Ser: 1.67 mg/dL — ABNORMAL HIGH (ref 0.76–1.27)
Glucose: 95 mg/dL (ref 70–99)
Potassium: 4.4 mmol/L (ref 3.5–5.2)
Sodium: 136 mmol/L (ref 134–144)
eGFR: 41 mL/min/{1.73_m2} — ABNORMAL LOW (ref >59)

## 2022-11-28 NOTE — Telephone Encounter (Signed)
Call to discuss Lab result, patient is not home wife requested to call back after lunch .  Will call back   SrCr elevated - check on hydration, assess NSAIDs use  Repeat BMP in 1 week - can consider replacing loop diuretics to spironolactone + prn loop diuretics with close monitoring on potassium and magnesium

## 2022-11-28 NOTE — Telephone Encounter (Signed)
Spoke to patient does not take OTC NSAID can improve on water intake current water intake amy be 1-2 glass per day.  Reiterated importance of hydration. Suggest to drink 6 to 8 cups or glasses of fluid a day. Will repeat lab on Monday Aug 26.

## 2022-12-04 ENCOUNTER — Ambulatory Visit: Payer: Medicare Other | Attending: Internal Medicine

## 2022-12-05 ENCOUNTER — Telehealth: Payer: Self-pay | Admitting: Pharmacist

## 2022-12-05 DIAGNOSIS — I10 Essential (primary) hypertension: Secondary | ICD-10-CM

## 2022-12-05 LAB — BASIC METABOLIC PANEL
BUN/Creatinine Ratio: 14 (ref 10–24)
BUN: 22 mg/dL (ref 8–27)
CO2: 22 mmol/L (ref 20–29)
Calcium: 9.2 mg/dL (ref 8.6–10.2)
Chloride: 104 mmol/L (ref 96–106)
Creatinine, Ser: 1.56 mg/dL — ABNORMAL HIGH (ref 0.76–1.27)
Glucose: 102 mg/dL — ABNORMAL HIGH (ref 70–99)
Potassium: 4.5 mmol/L (ref 3.5–5.2)
Sodium: 141 mmol/L (ref 134–144)
eGFR: 44 mL/min/{1.73_m2} — ABNORMAL LOW (ref >59)

## 2022-12-05 MED ORDER — HYDROCHLOROTHIAZIDE 12.5 MG PO TABS: 12.5000 mg | ORAL_TABLET | Freq: Every day | ORAL | 3 refills | Status: DC

## 2022-12-05 NOTE — Telephone Encounter (Signed)
Discussed updated BMP - patient states his water intake has improved but still drinking only 3-4 glass of water per day.  In agreement to start mild dose thiazide diuretics for better BP control, patient made aware of risk for dehydration and electrolytes imbalance. Patient will increase water intake to avoid diuretics side effects.   Will start taking hydrochlorothiazide 12.5 mg daily from tomorrow (12/06/2022) will repeat magnesium and BMP in 2 week, lab scheduled on Sept 12.

## 2022-12-12 ENCOUNTER — Emergency Department (HOSPITAL_COMMUNITY): Payer: Medicare Other

## 2022-12-12 ENCOUNTER — Encounter (HOSPITAL_COMMUNITY): Payer: Self-pay

## 2022-12-12 ENCOUNTER — Other Ambulatory Visit: Payer: Self-pay

## 2022-12-12 ENCOUNTER — Observation Stay (HOSPITAL_COMMUNITY)
Admission: EM | Admit: 2022-12-12 | Discharge: 2022-12-13 | Disposition: A | Discharge status: Home / Self Care | Payer: Medicare Other | Attending: Internal Medicine | Admitting: Internal Medicine

## 2022-12-12 DIAGNOSIS — Z7901 Long term (current) use of anticoagulants: Secondary | ICD-10-CM | POA: Insufficient documentation

## 2022-12-12 DIAGNOSIS — G928 Other toxic encephalopathy: Secondary | ICD-10-CM

## 2022-12-12 DIAGNOSIS — Z8673 Personal history of transient ischemic attack (TIA), and cerebral infarction without residual deficits: Secondary | ICD-10-CM | POA: Insufficient documentation

## 2022-12-12 DIAGNOSIS — R569 Unspecified convulsions: Secondary | ICD-10-CM | POA: Diagnosis not present

## 2022-12-12 DIAGNOSIS — G9340 Encephalopathy, unspecified: Principal | ICD-10-CM | POA: Insufficient documentation

## 2022-12-12 DIAGNOSIS — G459 Transient cerebral ischemic attack, unspecified: Secondary | ICD-10-CM

## 2022-12-12 DIAGNOSIS — R4701 Aphasia: Secondary | ICD-10-CM | POA: Insufficient documentation

## 2022-12-12 DIAGNOSIS — I110 Hypertensive heart disease with heart failure: Secondary | ICD-10-CM

## 2022-12-12 DIAGNOSIS — Z79899 Other long term (current) drug therapy: Secondary | ICD-10-CM | POA: Diagnosis not present

## 2022-12-12 DIAGNOSIS — M6281 Muscle weakness (generalized): Secondary | ICD-10-CM | POA: Insufficient documentation

## 2022-12-12 DIAGNOSIS — Z86718 Personal history of other venous thrombosis and embolism: Secondary | ICD-10-CM | POA: Diagnosis not present

## 2022-12-12 DIAGNOSIS — N401 Enlarged prostate with lower urinary tract symptoms: Secondary | ICD-10-CM | POA: Insufficient documentation

## 2022-12-12 DIAGNOSIS — R001 Bradycardia, unspecified: Secondary | ICD-10-CM | POA: Diagnosis not present

## 2022-12-12 DIAGNOSIS — I5030 Unspecified diastolic (congestive) heart failure: Secondary | ICD-10-CM | POA: Diagnosis not present

## 2022-12-12 DIAGNOSIS — G894 Chronic pain syndrome: Secondary | ICD-10-CM | POA: Diagnosis not present

## 2022-12-12 DIAGNOSIS — F17210 Nicotine dependence, cigarettes, uncomplicated: Secondary | ICD-10-CM

## 2022-12-12 DIAGNOSIS — I10 Essential (primary) hypertension: Secondary | ICD-10-CM | POA: Diagnosis not present

## 2022-12-12 LAB — I-STAT CHEM 8, ED
BUN: 28 mg/dL — ABNORMAL HIGH (ref 8–23)
Calcium, Ion: 1.21 mmol/L (ref 1.15–1.40)
Chloride: 101 mmol/L (ref 98–111)
Creatinine, Ser: 1.3 mg/dL — ABNORMAL HIGH (ref 0.61–1.24)
Glucose, Bld: 121 mg/dL — ABNORMAL HIGH (ref 70–99)
HCT: 44 % (ref 39.0–52.0)
Hemoglobin: 15 g/dL (ref 13.0–17.0)
Potassium: 4.3 mmol/L (ref 3.5–5.1)
Sodium: 135 mmol/L (ref 135–145)
TCO2: 24 mmol/L (ref 22–32)

## 2022-12-12 LAB — CBG MONITORING, ED: Glucose-Capillary: 119 mg/dL — ABNORMAL HIGH (ref 70–99)

## 2022-12-12 LAB — COMPREHENSIVE METABOLIC PANEL
ALT: 17 U/L (ref 0–44)
AST: 19 U/L (ref 15–41)
Albumin: 3.8 g/dL (ref 3.5–5.0)
Alkaline Phosphatase: 59 U/L (ref 38–126)
Anion gap: 12 (ref 5–15)
BUN: 22 mg/dL (ref 8–23)
CO2: 22 mmol/L (ref 22–32)
Calcium: 9.6 mg/dL (ref 8.9–10.3)
Chloride: 98 mmol/L (ref 98–111)
Creatinine, Ser: 1.29 mg/dL — ABNORMAL HIGH (ref 0.61–1.24)
GFR, Estimated: 55 mL/min — ABNORMAL LOW (ref >60)
Glucose, Bld: 122 mg/dL — ABNORMAL HIGH (ref 70–99)
Potassium: 4.2 mmol/L (ref 3.5–5.1)
Sodium: 132 mmol/L — ABNORMAL LOW (ref 135–145)
Total Bilirubin: 0.9 mg/dL (ref 0.3–1.2)
Total Protein: 7.7 g/dL (ref 6.5–8.1)

## 2022-12-12 LAB — URINALYSIS, W/ REFLEX TO CULTURE (INFECTION SUSPECTED)
Bacteria, UA: NONE SEEN
Bilirubin Urine: NEGATIVE
Glucose, UA: NEGATIVE mg/dL
Ketones, ur: NEGATIVE mg/dL
Leukocytes,Ua: NEGATIVE
Nitrite: NEGATIVE
Protein, ur: NEGATIVE mg/dL
RBC / HPF: >50 RBC/hpf (ref 0–5)
Specific Gravity, Urine: 1.014 (ref 1.005–1.030)
pH: 6 (ref 5.0–8.0)

## 2022-12-12 LAB — DIFFERENTIAL
Abs Immature Granulocytes: 0.04 10*3/uL (ref 0.00–0.07)
Basophils Absolute: 0.1 10*3/uL (ref 0.0–0.1)
Basophils Relative: 1 %
Eosinophils Absolute: 0.1 10*3/uL (ref 0.0–0.5)
Eosinophils Relative: 2 %
Immature Granulocytes: 1 %
Lymphocytes Relative: 22 %
Lymphs Abs: 1.9 10*3/uL (ref 0.7–4.0)
Monocytes Absolute: 0.7 10*3/uL (ref 0.1–1.0)
Monocytes Relative: 8 %
Neutro Abs: 6 10*3/uL (ref 1.7–7.7)
Neutrophils Relative %: 66 %

## 2022-12-12 LAB — I-STAT ARTERIAL BLOOD GAS, ED
Acid-base deficit: 1 mmol/L (ref 0.0–2.0)
Bicarbonate: 24 mmol/L (ref 20.0–28.0)
Calcium, Ion: 1.29 mmol/L (ref 1.15–1.40)
HCT: 42 % (ref 39.0–52.0)
Hemoglobin: 14.3 g/dL (ref 13.0–17.0)
O2 Saturation: 92 %
Patient temperature: 97.5
Potassium: 3.8 mmol/L (ref 3.5–5.1)
Sodium: 134 mmol/L — ABNORMAL LOW (ref 135–145)
TCO2: 25 mmol/L (ref 22–32)
pCO2 arterial: 40.6 mmHg (ref 32–48)
pH, Arterial: 7.377 (ref 7.35–7.45)
pO2, Arterial: 62 mmHg — ABNORMAL LOW (ref 83–108)

## 2022-12-12 LAB — RAPID URINE DRUG SCREEN, HOSP PERFORMED
Amphetamines: NOT DETECTED
Barbiturates: NOT DETECTED
Benzodiazepines: NOT DETECTED
Cocaine: NOT DETECTED
Opiates: NOT DETECTED
Tetrahydrocannabinol: NOT DETECTED

## 2022-12-12 LAB — CBC
HCT: 43.1 % (ref 39.0–52.0)
Hemoglobin: 14.6 g/dL (ref 13.0–17.0)
MCH: 31.3 pg (ref 26.0–34.0)
MCHC: 33.9 g/dL (ref 30.0–36.0)
MCV: 92.5 fL (ref 80.0–100.0)
Platelets: 180 10*3/uL (ref 150–400)
RBC: 4.66 MIL/uL (ref 4.22–5.81)
RDW: 13.2 % (ref 11.5–15.5)
WBC: 8.9 10*3/uL (ref 4.0–10.5)
nRBC: 0 % (ref 0.0–0.2)

## 2022-12-12 LAB — ETHANOL: Alcohol, Ethyl (B): <10 mg/dL (ref <10)

## 2022-12-12 LAB — APTT: aPTT: 42 s — ABNORMAL HIGH (ref 24–36)

## 2022-12-12 LAB — PROTIME-INR
INR: 1 (ref 0.8–1.2)
Prothrombin Time: 13.6 s (ref 11.4–15.2)

## 2022-12-12 MED ORDER — MAGNESIUM OXIDE 400 MG PO TABS
400.0000 mg | ORAL_TABLET | Freq: Two times a day (BID) | ORAL | Status: DC
  Administered 2022-12-12 – 2022-12-13 (×2): 400 mg via ORAL
  Filled 2022-12-12 (×5): qty 1

## 2022-12-12 MED ORDER — IOHEXOL 350 MG/ML SOLN
75.0000 mL | Freq: Once | INTRAVENOUS | Status: AC | PRN
  Administered 2022-12-12: 75 mL via INTRAVENOUS

## 2022-12-12 MED ORDER — SODIUM CHLORIDE 0.9% FLUSH: 3.0000 mL | Freq: Once | INTRAVENOUS | Status: DC

## 2022-12-12 MED ORDER — ACETAMINOPHEN 500 MG PO TABS: 1000.0000 mg | ORAL_TABLET | Freq: Three times a day (TID) | ORAL | Status: DC | PRN

## 2022-12-12 MED ORDER — APIXABAN 5 MG PO TABS
5.0000 mg | ORAL_TABLET | Freq: Every day | ORAL | Status: DC
  Administered 2022-12-12: 5 mg via ORAL
  Filled 2022-12-12: qty 1

## 2022-12-12 MED ORDER — ATORVASTATIN CALCIUM 40 MG PO TABS
40.0000 mg | ORAL_TABLET | Freq: Every day | ORAL | Status: DC
  Administered 2022-12-12 – 2022-12-13 (×2): 40 mg via ORAL
  Filled 2022-12-12 (×2): qty 1

## 2022-12-12 MED ORDER — APIXABAN 5 MG PO TABS: 5.0000 mg | ORAL_TABLET | Freq: Two times a day (BID) | ORAL | Status: DC

## 2022-12-12 MED ORDER — PANTOPRAZOLE SODIUM 40 MG PO TBEC
40.0000 mg | DELAYED_RELEASE_TABLET | Freq: Every day | ORAL | Status: DC
  Administered 2022-12-13: 40 mg via ORAL
  Filled 2022-12-12: qty 1

## 2022-12-12 NOTE — Code Documentation (Addendum)
Stroke Response Nurse Documentation Code Documentation  Andrew Hunt is a 82 y.o. male arriving to [facility]  via [facility] on 12/12/2022 with past medical hx of DVT on eliquis, AAA, HTN, Gerd, TIA. On Eliquis (apixaban) daily. Code stroke was activated by EMS.   Patient from home where he was LKW at 0815 and now complaining of R sided weakness and aphasia. Per family, around 62 patient informed family he was feeling dizzy and they noticed he began to have slurred speech. When EMS arrived, patient was aphasic and not following commands.   Stroke team at the bedside on patient arrival. Labs drawn and patient cleared for CT by Dr. Rubin Payor. Patient to CT with team. NIHSS 22, see documentation for details and code stroke times. Patient with decreased LOC, disoriented, not following commands, Right gaze preference , bilateral, right hemianopia, bilateral arm weakness, bilateral leg weakness, Global aphasia , and dysarthria  on exam. The following imaging was completed:  CT Head, CTA, and MRI. Patient is not a candidate for IV Thrombolytic due to currently taking Eliquis per MD. Patient is not a candidate for IR due to no LVO noted on imaging.   Care Plan: Code Stroke Canceled due to no stroke noted on imaging per MD.  Bedside handoff with ED RN Katrina.     Felecia Jan  Stroke Response RN

## 2022-12-12 NOTE — H&P (Addendum)
Date: 12/12/2022               Patient Name:  Andrew Hunt MRN: [id number]  DOB: Jan 12, 1941 Age / Sex: 82 y.o., male   PCP: Eartha Inch, MD         Medical Service: Internal Medicine Teaching Service         Attending Physician: Dr. Gust Rung, DO      First Contact: Dr. Katheran James, DO Pager [phone]    Second Contact: Dr. Rocky Morel, DO Pager [phone]         After Hours (After 5p/  First Contact Pager: [phone]  weekends / holidays): Second Contact Pager: [phone]   SUBJECTIVE   Chief Complaint: Encephalopathy  History of Present Illness:  Mr Andrew Hunt is an 82 yo M with PMH DVT/PE on eliquis, AAA, TIA, HTN, HLD who presented to the [facility] ED on the morning of 9/3 as a code stroke with R leg weakness, aphasia, and decreased responsiveness. His last known normal was at 815 this morning. Thereafter, his daughter noticed slurred speech and he complained of dizziness. Over the next 15 minutes, this progressed to aphasia and generalized weakness upon EMS arrival and he was not following commands. When interviewing the patient, he was able to communicate but exhibited dysarthria and felt "fuzzy". He did not have acute concerns and even wondered if he needed to be admitted to the hospital. He described dizziness and fatigue present for the past two days. He feels similar to his past TIAs. He described a cough with brown phlegm present for four months. He reports reduced appetite. He denies recent illness/sick contacts, fevers, chills, dyspnea, abdominal pain, chest pain, N/V/D, changes in eating or in bowel habits. He denies seizure history, denies syncope. He notes that he has had "mini-strokes" in the past. Prior to today he was living his normal lifestyle involving activity in the yard and caring for his wife who has bladder cancer. He has been under stress regarding his wife. He denies recent heavy exertion or extended periods outside.  ED Course: [facility] ED on the  morning of 9/3 as a code stroke with R sided weakness, aphasia, decreased responsiveness, not following commands. Last normal 815. Initial assessment moving all extremities but nonverbal and not following commands. NIHSS was 22. CT, CTA, and MRI were not consistent with acute stroke and code stroke was cancelled. Mental status improved in ED but not back to baseline. Neurology evaluated with EEG that was within normal limits. Noted that HR was declining as low as 45.  - Vitals otherwise include HTN as high as 174/109 improved to 147/75.  - UA not infectious.  - UDS and ethanol unremarkable. - CMP with Na 132 otherwise unremarkable - CBC unremarkable - CT Head and MRI brain without acute process - CTA with mild plaque at both carotid bifurcations without significant stenosis/occlusion and mild-moderate stenosis of both intracranial ICAs - CXR without active lung disease  Past Medical History Past Medical History:  Diagnosis Date   AAA (abdominal aortic aneurysm) (HCC)    Arthritis    GERD (gastroesophageal reflux disease)    Hyperlipidemia    Hypertension    Lung nodule    TIA (transient ischemic attack)      Meds:  Tylenol 650mg  q6h prn for pain Amlodipine 10mg  every day Apixaban 5mg  BID (Pt taking 5mg  at bedtime) Atorvastatin 40mg  every day Furosemide 40mg  every day Glucosamine complex 1500mg  every day Hydrocholrothiazide 12.5 mg every day (pt  taking 6.25mg ) Mag-Ox 400mg  BID Nebivolol 5mg  every day Omeprazole 20mg  every day Tamsulosin 0.4mg  BID Telmisartan 80mg  every day Tizanidine 2-4mg  at bedtime prn  Past Surgical History Past Surgical History:  Procedure Laterality Date   APPENDECTOMY     BACK SURGERY     CHOLECYSTECTOMY     COLONOSCOPY N/A 05/05/2014   Procedure: COLONOSCOPY;  Surgeon: Corbin Ade, MD;  Location: [facility];  Service: Endoscopy;  Laterality: N/A;  100   MULTIPLE TOOTH EXTRACTIONS      Social:  Lives at home with his wife Occupation:  retired Psychologist, occupational Support: local family is very close Level of Function: independent in ADLs/IADLs able to work in yard often PCP: Eartha Inch, MD Substances: cigarettes 8-10 per day, no alcohol, no substances  Family History:  Mother with AAA and died sudden cardiac death  Allergies: Allergies as of 12/12/2022 - Review Complete 12/12/2022  Allergen Reaction Noted   Hydrocodone Itching 09/30/2019   Oxycodone Itching 03/16/2019   Vioxx [rofecoxib] Swelling and Other (See Comments) 04/23/2014    Review of Systems: A complete ROS was negative except as per HPI.   OBJECTIVE:   Physical Exam: Blood pressure (!) 147/75, pulse (!) 47, temperature (!) 97.5 F (36.4 C), temperature source Axillary, resp. rate 15, height 5\' 6"  (1.676 m), weight 77.8 kg, SpO2 99%.  Constitutional:Well appearing. In no acute distress. Sleeping at start of interview and falls asleep quickly, though able to remain awake for interview. HENT: Normocephalic, atraumatic,  Eyes: Sclera non-icteric, PERRL, EOM intact Neck:normal atraumatic, no neck masses, normal thyroid, no jvd Cardio:Regular rate and rhythm. No murmurs, rubs, or gallops. 2+ bilateral radial and dorsalis pedis  pulses. Pulm:Clear to auscultation bilaterally. Normal work of breathing on room air. Abdomen: Soft, mild periumbilical tenderness, non-distended, positive bowel sounds. QMV:HQIONGEX for extremity edema. Skin:Warm and dry. Neuro: Alert and oriented x2. Believes the year is 29 and his age is 40 (each off by 10 years). No focal deficit noted, unremarkable neurologic exam. Psych:Pleasant mood and affect.  Labs: CBC    Component Value Date/Time   WBC 8.9 12/12/2022 0943   RBC 4.66 12/12/2022 0943   HGB 14.3 12/12/2022 1041   HCT 42.0 12/12/2022 1041   PLT 180 12/12/2022 0943   MCV 92.5 12/12/2022 0943   MCH 31.3 12/12/2022 0943   MCHC 33.9 12/12/2022 0943   RDW 13.2 12/12/2022 0943   LYMPHSABS 1.9 12/12/2022 0943   MONOABS 0.7  12/12/2022 0943   EOSABS 0.1 12/12/2022 0943   BASOSABS 0.1 12/12/2022 0943     CMP     Component Value Date/Time   NA 134 (L) 12/12/2022 1041   NA 141 12/04/2022 0000   K 3.8 12/12/2022 1041   CL 98 12/12/2022 0943   CL 101 12/12/2022 0943   CO2 22 12/12/2022 0943   GLUCOSE 122 (H) 12/12/2022 0943   GLUCOSE 121 (H) 12/12/2022 0943   BUN 22 12/12/2022 0943   BUN 28 (H) 12/12/2022 0943   BUN 22 12/04/2022 0000   CREATININE 1.29 (H) 12/12/2022 0943   CREATININE 1.30 (H) 12/12/2022 0943   CALCIUM 9.6 12/12/2022 0943   PROT 7.7 12/12/2022 0943   ALBUMIN 3.8 12/12/2022 0943   AST 19 12/12/2022 0943   ALT 17 12/12/2022 0943   ALKPHOS 59 12/12/2022 0943   BILITOT 0.9 12/12/2022 0943   GFRNONAA 55 (L) 12/12/2022 0943   GFRAA >60 05/20/2019 1435    Imaging:  EEG adult  Result Date: 12/12/2022 Lindie Spruce  Val Eagle, MD     12/12/2022 12:45 PM Patient Name: JOSHAWA LAPIETRA MRN: [id number] Epilepsy Attending: Charlsie Quest Referring Physician/Provider: Elmer Picker, NP Date: 12/12/2022 Duration: 24.28 mins Patient history: 82 y.o. male with a past medical history of DVT on eliquis, AAA, arthritis, GERD, HTN, HLD, and TIA presenting with aphasia and right sided weakness. EEG to evaluate for seizure Level of alertness: Awake, asleep AEDs during EEG study: None Technical aspects: This EEG study was done with scalp electrodes positioned according to the 10-20 International system of electrode placement. Electrical activity was reviewed with band pass filter of 1-70Hz , sensitivity of 7 uV/mm, display speed of 58mm/sec with a 60Hz  notched filter applied as appropriate. EEG data were recorded continuously and digitally stored.  Video monitoring was available and reviewed as appropriate. Description: The posterior dominant rhythm consists of 8 Hz activity of moderate voltage (25-35 uV) seen predominantly in posterior head regions, symmetric and reactive to eye opening and eye closing. Sleep was  characterized by vertex waves, sleep spindles (12 to 14 Hz), maximal frontocentral region. Hyperventilation and photic stimulation were not performed.   IMPRESSION: This study is within normal limits. No seizures or epileptiform discharges were seen throughout the recording. A normal interictal EEG does not exclude the diagnosis of epilepsy. Charlsie Quest   DG Chest Portable 1 View  Result Date: 12/12/2022 CLINICAL DATA:  Altered mental status. EXAM: PORTABLE CHEST 1 VIEW COMPARISON:  06/20/2022 FINDINGS: Stable mild-to-moderate cardiomegaly. Both lungs are clear. No evidence of pleural effusion. IMPRESSION: Cardiomegaly. No active lung disease. Electronically Signed   By: Danae Orleans M.D.   On: 12/12/2022 11:29   MR BRAIN WO CONTRAST  Result Date: 12/12/2022 CLINICAL DATA:  Right-sided weakness and aphasia. EXAM: MRI HEAD WITHOUT CONTRAST TECHNIQUE: Multiplanar, multiecho pulse sequences of the brain and surrounding structures were obtained without intravenous contrast. COMPARISON:  Same-day head CT FINDINGS: A truncated protocol was performed. Axial and coronal DWI, axial FLAIR, and axial T2 images were obtained. The provided sequences are moderately motion degraded. Brain: There is no evidence of acute intracranial hemorrhage, extra-axial fluid collection, or acute infarct. Parenchymal volume is within expected limits. The ventricles are normal in size. Patchy FLAIR signal abnormality in the supratentorial white matter and pons likely reflects sequela of underlying chronic small-vessel ischemic change. Small remote infarcts are seen in the bilateral corona radiata, basal ganglia, and thalami. There is no solid mass lesion there is no mass effect or midline shift. Vascular: Normal flow voids. Skull and upper cervical spine: Suboptimally assessed on the provided sequences. Sinuses/Orbits: The paranasal sinuses are clear. The globes and orbits are unremarkable. Other: The mastoid air cells and middle ear  cavities are clear. IMPRESSION: No acute intracranial pathology. Electronically Signed   By: Lesia Hausen M.D.   On: 12/12/2022 10:31   CT ANGIO HEAD NECK W WO CM (CODE STROKE)  Result Date: 12/12/2022 CLINICAL DATA:  Code stroke EXAM: CT ANGIOGRAPHY HEAD AND NECK WITH AND WITHOUT CONTRAST TECHNIQUE: Multidetector CT imaging of the head and neck was performed using the standard protocol during bolus administration of intravenous contrast. Multiplanar CT image reconstructions and MIPs were obtained to evaluate the vascular anatomy. Carotid stenosis measurements (when applicable) are obtained utilizing NASCET criteria, using the distal internal carotid diameter as the denominator. RADIATION DOSE REDUCTION: This exam was performed according to the departmental dose-optimization program which includes automated exposure control, adjustment of the mA and/or kV according to patient size and/or use of iterative reconstruction technique. CONTRAST:  75mL OMNIPAQUE IOHEXOL 350 MG/ML SOLN COMPARISON:  Same-day CT head, carotid Doppler 07/17/2019 FINDINGS: CTA NECK FINDINGS Aortic arch: There is mild calcified plaque in the imaged aortic arch. The origins of the major branch vessels are patent. The subclavian arteries are patent to the level imaged. Right carotid system: The right common, internal, and external carotid arteries are patent, with mild plaque at the bifurcation but no hemodynamically significant stenosis or occlusion. There is no evidence of dissection or aneurysm. There is a medialized course of the right common and internal carotid arteries. Left carotid system: The left common, internal, and external carotid arteries are patent, with mild plaque of the bifurcation but no hemodynamically significant stenosis or occlusion. There is no evidence of dissection or aneurysm. There is a medialized course of the common and internal carotid arteries. Vertebral arteries: The vertebral arteries are patent, without  hemodynamically significant stenosis or occlusion. There is no evidence of dissection or aneurysm. Skeleton: There is no acute osseous abnormality or suspicious osseous lesion. There is no visible canal hematoma. Other neck: There are soft tissue nodules in both parotid glands measuring up 2 1.1 cm x 1.4 cm inferiorly on the right, 1.2 cm x 0.9 cm more superiorly on the right, and 1.9 cm x 1.4 cm on the left. 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 Upper chest: A calcified granuloma is noted in the right upper lobe. Review of the MIP images confirms the above findings CTA HEAD FINDINGS Anterior circulation: There is calcified plaque in the intracranial ICAs resulting in mild-to-moderate stenosis bilaterally. Note that the petrous segments are suboptimally evaluated due to streak artifact and bolus timing. The bilateral MCAs are patent, without proximal stenosis or occlusion. 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 There is short-segment moderate stenosis of the left A2 segment (7-66, 8-144). The ACAS are otherwise patent, without other proximal stenosis or occlusion. The anterior communicating artery is normal. There is no aneurysm or AVM. Posterior circulation: The bilateral V4 segments are patent with mild plaque but no significant stenosis or occlusion. The basilar artery is patent. The major cerebellar arteries appear patent. The bilateral PCAs are patent, without proximal stenosis or occlusion. Posterior communicating arteries are not identified. There is no aneurysm or AVM. Venous sinuses: Not well evaluated due to bolus timing. Anatomic variants: As above. Review of the MIP images confirms the above findings IMPRESSION: 1. No emergent vascular finding. 2. Mild plaque at both carotid bifurcations without significant stenosis or occlusion. 3. 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 4. 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 Findings communicated to Dr Wilford Corner at 10:08 am Electronically Signed   By: Lesia Hausen M.D.   On: 12/12/2022 10:12   CT HEAD CODE STROKE WO CONTRAST  Result Date: 12/12/2022 CLINICAL DATA:  Code stroke.  Neuro deficit EXAM: CT HEAD WITHOUT CONTRAST TECHNIQUE: Contiguous axial images were obtained from the base of the skull through the vertex without intravenous contrast. RADIATION DOSE REDUCTION: This exam was performed according to the departmental dose-optimization program which includes automated exposure control, adjustment of the mA and/or kV according to patient size and/or use of iterative reconstruction technique. COMPARISON:  CT Head 12/18/19 FINDINGS: Limitations: Motion degraded exam Brain: No hemorrhage. No hydrocephalus. No extra-axial fluid collection. Sequela of severe chronic microvascular ischemic change. Chronic infarcts in the bilateral corona radiata  and left thalamus. Vascular: Apparent asymmetrically hyperdense appearance of the left MCA is likely artifactual. Skull: Normal. Negative for fracture or focal lesion. Sinuses/Orbits: 2 no middle ear or mastoid effusion. Paranasal sinuses are clear. Orbits are unremarkable. Other: None. ASPECTS [facility] Stroke Program Early CT Score): 10 when accounting for chronic findings. IMPRESSION: Motion degraded exam. Within this limitation, no hemorrhage or CT evidence of an acute cortical infarct. Aspects is 10 when accounting for chronic findings Findings were paged to Dr. Wilford Corner on 12/12/2022 at 9:49 a.m via [facility] paging system. Electronically Signed   By: Lorenza Cambridge M.D.   On: 12/12/2022 09:52     EKG: personally reviewed my interpretation is sinus rhythm. Consistent with prior EKG.  ASSESSMENT & PLAN:   Assessment & Plan by Problem: Principal Problem:   Acute encephalopathy  Cheyenne B Roh is a 82 y.o. male  with pertinent PMH of PMH LE DVT/PE now on eliquis, AAA, TIA, HTN, HLD who presented with code stroke for R weakness, aphasia and is admitted for acute encephalopathy.  Acute Encephalopathy Pt presented with R-sided weakness, aphasia, and not following commands of sudden onset in context of two days of fatigue and dizziness. This improved over the course of a few hours in the ED and at time of admission he was alert, remained disorented to year, and had lingering dysarthria but can communicate. Stroke negative per imaging and further there are no structural findings that would explain his illness. He does not appear to have infection per UA, CXR, cranial imaging and he does not have leukocytosis. Metabolic panel is unremarkable for acute electrolytes/glucose. Toxicology is negative. He manages his own medications but I reviewed his pill carrier and it was well organized so I do not suspect a medication accident. EEG in ED not correlated to seizure although cannot definitively rule out epilepsy. Worth noting this patient reports a history of TIAs and the neurologic insult resembles a TIA, but the duration of symptoms which have lasted for many hours makes that diagnosis less favorable. At this point, the cause of his encephalopathy remains unclear.  - stroke workup negative and no structural findings - metabolics unremarkable - toxic and infectious insult appear unlikely at this point - CCM x 48 hours - Reevaluate mentation in AM - dysarthria and mild confusion are present this evening  Bradycardia HTN HFpEF HR as low as 45 during initial evaluation. This appears to be chronic - review of office notes from the summer show HR as low as 40 and it appears that his team's goal has been managing the patients HTN/CHF (primary symptom LE edema) in the context of bradycardia, such as recent reduction in the beta blocker dose. ECHO in May of this year revealed EF 60-65% with mild LA dilation and mild LV  hypertrophy. Today he is euvolemic on exam, no LE edema, no dyspnea, extremities are warm, mentation is improving, so not concerned for exacerbation or poor perfusion at time of examination. 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 Pt remains on Eliquis at this time. He is prescribed 5mg  BID but appears to take 5mg  at bedtime. Unclear if provoked vs unprovoked and if he needs to be on Eliquis now. Will evaluate as patient's mentation improves.  HLD - continue home atorvastatin 40mg  GERD - continue home pantoprazole 40mg  BPH -  hold home tamsulosin for now to maintain orthostatics as mentation improves Chronic pain - Tylenol prn - hold home tizanidine for now per mentation  Diet: Normal VTE: DOAC IVF: None,None Code: Full  Dispo: Admit patient to Observation with expected length of stay less than 2 midnights.  Signed: Katheran James, DO Internal Medicine Resident PGY-1  12/12/2022, 4:03 PM   Dr. Katheran James, DO Pager [phone]

## 2022-12-12 NOTE — ED Notes (Signed)
CBG 119 

## 2022-12-12 NOTE — Consult Note (Addendum)
Neurology Consultation  Reason for Consult: Code Stroke Referring Physician: Rubin Payor  CC:  History is obtained from: spouse/SO, relative(s), and EMS personnel  HPI: Andrew Hunt is a 82 y.o. male with a past medical history of DVT on eliquis, AAA, arthritis, GERD, HTN, HLD, and TIA presenting with aphasia and right sided weakness. Per family he was in his usual state of health this morning and was able to take all of his medications. At around 0815 he told his family he was feeling dizzy and began having slurred speech. On EMS arrival he was aphasic, not following commands.  On arrival he is not following commands, inconsistent blink to threat. Appears to be weaker on the right. Glucose 119. Taken from the bridge to CT for CT Head, CT Angio head and neck, and then to MRI. MRI on initial review appears negative for acute infarct.  Son states that patient's wife has a severe case of shingles currently, additionally she is fighting cancer.  He has been in his usual state of health for the last couple of weeks and able to do yard work and manage his ADLs independently.   LKW: 0800 TNK given?: No, on Eliquis  Mechanical Thrombectomy? No, no LVO Premorbid modified Rankin scale (mRS): 0 0-Completely asymptomatic and back to baseline post-stroke 1-No significant post stroke disability and can perform usual duties with stroke symptoms 2-Slight disability-UNABLE to perform all activities but does not need assistance  3-Moderate disability-requires help but walks WITHOUT assistance  ROS: Unable to obtain due to altered mental status.   Past Medical History:  Diagnosis Date   AAA (abdominal aortic aneurysm) (HCC)    Arthritis    GERD (gastroesophageal reflux disease)    Hyperlipidemia    Hypertension    Lung nodule    TIA (transient ischemic attack)     Family History  Problem Relation Age of Onset   Diabetes Mellitus II Mother    Kidney disease Mother    Lung cancer Sister      Social History:   reports that he has been smoking cigarettes. He has a 15.5 pack-year smoking history. He has never used smokeless tobacco. He reports that he does not drink alcohol and does not use drugs.  Medications  Current Facility-Administered Medications:    sodium chloride flush (NS) 0.9 % injection 3 mL, 3 mL, Intravenous, Once, Benjiman Core, MD  Current Outpatient Medications:    acetaminophen (TYLENOL) 325 MG tablet, Take 2 tablets (650 mg total) by mouth every 6 (six) hours as needed for mild pain, fever or headache (or Fever >/= 101)., Disp: 30 tablet, Rfl: 1   amLODipine (NORVASC) 10 MG tablet, Take 10 mg by mouth daily., Disp: , Rfl:    apixaban (ELIQUIS) 5 MG TABS tablet, Take 1 tablet (5 mg total) by mouth 2 (two) times daily. Start on 01/272020 after completing the starter pack, Disp: 60 tablet, Rfl: 2   atorvastatin (LIPITOR) 40 MG tablet, Take 40 mg by mouth daily., Disp: , Rfl:    furosemide (LASIX) 40 MG tablet, Take 1 tablet (40 mg total) by mouth daily., Disp: 90 tablet, Rfl: 3   Glucosamine-Chondroit-Vit C-Mn (GLUCOSAMINE 1500 COMPLEX PO), Take 1,500 mg by mouth daily., Disp: , Rfl:    hydrochlorothiazide (HYDRODIURIL) 12.5 MG tablet, Take 1 tablet (12.5 mg total) by mouth daily., Disp: 90 tablet, Rfl: 3   magnesium oxide (MAG-OX) 400 MG tablet, Take 1 tablet (400 mg total) by mouth 2 (two) times daily., Disp: 180 tablet, Rfl:  3   nebivolol (BYSTOLIC) 5 MG tablet, Take 1 tablet (5 mg total) by mouth daily., Disp: 90 tablet, Rfl: 3   omeprazole (PRILOSEC) 20 MG capsule, Take 20 mg by mouth daily., Disp: , Rfl:    tamsulosin (FLOMAX) 0.4 MG CAPS capsule, Take 0.4 mg by mouth at bedtime. , Disp: , Rfl:    telmisartan (MICARDIS) 80 MG tablet, Take 1 tablet (80 mg total) by mouth daily., Disp: 90 tablet, Rfl: 3   tiZANidine (ZANAFLEX) 4 MG tablet, Take 2-4 mg by mouth at bedtime as needed., Disp: , Rfl:    Exam: Current vital signs: Ht 5\' 6"  (1.676 m)   BMI  28.02 kg/m  Vital signs in last 24 hours:    GENERAL: Awake, alert in NAD HEENT: - Normocephalic and atraumatic, dry mm, no LN++, no Thyromegally LUNGS - Clear to auscultation bilaterally with no wheezes CV - S1S2 RRR, no m/r/g, equal pulses bilaterally. ABDOMEN - Soft, nontender, nondistended with normoactive BS Ext: warm, well perfused, intact peripheral pulses, no edema  NEURO:  Mental Status: Opens eyes to voice and noxious stimuli.  Requires repeated stimulation throughout the exam Language: Aphasic.  Does not follow commands Cranial Nerves: PERRL 3 mm/brisk. EOMI, visual fields full, no facial asymmetry, facial sensation intact, hearing intact, tongue/uvula/soft palate midline, normal sternocleidomastoid and trapezius muscle strength. No evidence of tongue atrophy or fasciculations Motor: Right upper extremity with minimal effort against gravity. Left upper extremity with mild drift  Bilateral lower extremities with antigravity strength however he does not elevate them to command Tone: is normal and bulk is normal Sensation- Intact to light touch bilaterally Coordination: Gait- deferred  NIHSS 1a Level of Conscious.: 2 1b LOC Questions: 2 1c LOC Commands: 2 2 Best Gaze: 1 3 Visual: 2 4 Facial Palsy: 0 5a Motor Arm - left: 1 5b Motor Arm - Right: 3 6a Motor Leg - Left: 2 6b Motor Leg - Right: 2 7 Limb Ataxia: 0 8 Sensory: 0 9 Best Language: 3 10 Dysarthria: 2 11 Extinct. and Inatten.: 0 TOTAL: 21   Labs I have reviewed labs in epic and the results pertinent to this consultation are:  CBC    Component Value Date/Time   WBC 8.8 06/20/2022 1314   RBC 3.69 (L) 06/20/2022 1314   HGB 11.8 (L) 06/20/2022 1314   HCT 34.0 (L) 06/20/2022 1314   PLT 174 06/20/2022 1314   MCV 92.1 06/20/2022 1314   MCH 32.0 06/20/2022 1314   MCHC 34.7 06/20/2022 1314   RDW 12.9 06/20/2022 1314   LYMPHSABS 1.8 06/20/2022 1314   MONOABS 0.8 06/20/2022 1314   EOSABS 0.2 06/20/2022  1314   BASOSABS 0.1 06/20/2022 1314    CMP     Component Value Date/Time   NA 141 12/04/2022 0000   K 4.5 12/04/2022 0000   CL 104 12/04/2022 0000   CO2 22 12/04/2022 0000   GLUCOSE 102 (H) 12/04/2022 0000   GLUCOSE 108 (H) 06/20/2022 1314   BUN 22 12/04/2022 0000   CREATININE 1.56 (H) 12/04/2022 0000   CALCIUM 9.2 12/04/2022 0000   PROT 6.5 06/20/2022 1314   ALBUMIN 3.2 (L) 06/20/2022 1314   AST 20 06/20/2022 1314   ALT 19 06/20/2022 1314   ALKPHOS 52 06/20/2022 1314   BILITOT 0.1 (L) 06/20/2022 1314   GFRNONAA 56 (L) 06/20/2022 1314   GFRAA >60 05/20/2019 1435    Lipid Panel     Component Value Date/Time   CHOL 143 07/25/2022 0952  TRIG 117 07/25/2022 0952   HDL 32 (L) 07/25/2022 0952   CHOLHDL 4.5 07/25/2022 0952   LDLCALC 90 07/25/2022 0952     Imaging I have reviewed the images obtained:  CT-head  Motion degraded exam. Within this limitation, no hemorrhage or CT evidence of an acute cortical infarct. Aspects is 10 when accounting for chronic findings  CT Angio Head and Neck  1. No emergent vascular finding. 2. Mild plaque at both carotid bifurcations without significant stenosis or occlusion. 3. 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 4. 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  MRI examination of the brain  Negative for acute infarct on personal review  Neurodiagnostics: Stat EEG negative for seizures.  Normal study.  Assessment:  82 y.o. male presenting with global aphasia and right sided weakness. He is on eliquis, reportedly taken this morning.  On examination, he appears to be globally aphasic and encephalopathic. According to family, around 8 AM he began having slurred speech, dizziness that progressed to global aphasia and altered mental status.  Has a history of COPD. Differentials include toxic metabolic  encephalopathy in the setting of an underlying infection versus metabolic derangements versus seizure versus stroke. Stat MRI of the brain was negative for stroke   Impression: Evaluate for toxic metabolic encephalopathy  Recommendations: Recommend ABG Chest x-ray UA Blood cultures Stat EEG-negative for seizures.  Normal study.  Patient seen and examined by NP/APP with MD. MD to update note as needed.   Elmer Picker, DNP, FNP-BC [facility] Pager: [phone]  Attending Neurohospitalist Addendum Patient seen and examined with APP/Resident. Agree with the history and physical as documented above. Agree with the plan as documented, which I helped formulate. I have independently reviewed the chart, obtained history, review of systems and examined the patient.I have personally reviewed pertinent head/neck/spine imaging (CT/MRI).  Exam consistent with encephalopathy with may be questionable right worse than left weakness. ABG reveals hypoxia. Suspect encephalopathy related to hypoxia or underlying infection such as UTI or pneumonia-needs further workup  Plan discussed with Dr. Rubin Payor and patient's son.  Please feel free to call with any questions.  -- Milon Dikes, MD Neurologist [facility] Pager: [phone]

## 2022-12-12 NOTE — ED Triage Notes (Signed)
Code stroke. Here for right sided leg weakness and aphasia. EMS reports it has progressed to now only responding to pain. Unable to follow commands. LKN at 0815. On Eliquis.

## 2022-12-12 NOTE — ED Notes (Signed)
Taken to MRI with stroke team and this RN.

## 2022-12-12 NOTE — ED Notes (Signed)
Pt speech is better at this time. Able to follow commands. Remains drowsy but easy to arouse. Able to ambulate to the bathroom and back to bed on a steady gait with minimal assistance. Will continue to monitor.

## 2022-12-12 NOTE — Hospital Course (Addendum)
Andrew Hunt is an 82 yo M with PMH DVT/PE on eliquis, AAA, TIA, HTN, HLD who presented to the [facility] ED on the morning of 9/3 as a code stroke with R hemiparesis, aphasia, and decreased responsiveness. He was felt to be normal early in the morning. Thereafter, his daughter noticed slurred speech and he complained of dizziness. Over the next 15 minutes, this progressed to aphasia and generalized weakness upon EMS arrival and he was not following commands. He was often bradycardic as low as 45. Stroke workup was negative. Furthermore, no structural abnormalities, metabolic derangements, signs of infection, or toxic insults were identified. The patient remained unresponsive for several hours in the ED but gradually his status improved. At time of admission, he was communicating and responding to commands with some lingering confusion and dysarthria. Echocardiography was completed. The patient returned to his full baseline on the morning after admission. He does not remember the events of yesterday morning. He communicated clearly, ambulated, and was able to manage ADLs. With full resolution of symptoms and no ongoing issues requiring hospital treatment, Andrew. [facility] was ready for discharge. After a full workup, we are attributing his encephalopathy to an episode of global hypoperfusion vs TIA vs seizure. He has a history of TIAs and that may be in play here, though his symptoms were present for many hours, and he also has underlying bradycardia and recent stress related to his wife's chronic illness. Risk factor modification addressed with statin adherence, smoking discussion. He will be sent home with preparations made for a holter monitor that will be mailed to him. It was noted that he has not been taking his statin regularly and today's lipid panel reflects that there is room for improvement with regular adherence. His family believes this episode resembles previous episodes that they describe as mini strokes.  In the past, they noted his confusion related to his medications, such as arthritis pain medicines, that have since stopped. It is unclear if he still needs to take his Eliquis that was started due to DVT/PE during Covid illness in 2020. We will discharge him on the reduced dose of 2.5mg  BID according to his age and creatinine with recommendations for further evaluation by his PCP. We have asked him to make appointments with his cardiologist, neurologist, and PCP.  Acute Encephalopathy Pt presented with R-sided weakness, aphasia, and not following commands of sudden onset in context of two days of fatigue and dizziness. This improved over the course of an afternoon in the ED and at time of admission he was alert though remained disorented to year and had lingering dysarthria but could communicate. Stroke negative per imaging and further there were no structural findings that would explain his illness. No infection per UA, CXR, cranial imaging and he did not have leukocytosis. Metabolic panel unremarkable for acute electrolytes/glucose. Toxicology negative. He manages his own medications but I reviewed his pill carrier and it was well organized so I did not suspect a medication accident. EEG in ED not correlated to seizure although cannot definitively rule out epilepsy. Worth noting this patient reports a history of TIAs and the neurologic insult resembles a TIA, but the duration of symptoms which lasted for many hours makes that diagnosis less favorable. CCM did not reveal issue beyond bradycardia. Mentation fully improved on morning of discharge. Leading theory at discharge is global hypoperfusion vs TIA vs seizure leading to encephalopathy.  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 ECHO in May of this year revealed EF 60-65% with mild LA dilation and mild LV hypertrophy. Repeat today. During this admission he was always euvolemic on exam, no LE edema, no dyspnea, extremities are warm, mentation is improving, so not concerned for exacerbation or poor perfusion lasting beyond primary insult. Home antihypertensives held at admission but will restart at discharge, will however hold his beta blocker which can be restarted if appropriate according to evaluation by his ambulatory caregivers.   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 Pt remains on Eliquis at this time. He is prescribed 5mg  BID but appears to take 5mg  at bedtime. Unclear if provoked vs unprovoked and if he needs to be on Eliquis now. Will evaluate as patient's mentation improves.   HLD - continue home atorvastatin 40mg  GERD - continue home pantoprazole 40mg  BPH - hold home tamsulosin for now to maintain orthostatics as mentation improves Chronic pain - Tylenol prn - hold home tizanidine for now per mentation

## 2022-12-12 NOTE — Progress Notes (Signed)
EEG complete - results pending 

## 2022-12-12 NOTE — Procedures (Signed)
Patient Name: MARCELINO PELLECCHIA  MRN: [id number]  Epilepsy Attending: Charlsie Quest  Referring Physician/Provider: Elmer Picker, NP  Date: 12/12/2022 Duration: 24.28 mins  Patient history: 83 y.o. male with a past medical history of DVT on eliquis, AAA, arthritis, GERD, HTN, HLD, and TIA presenting with aphasia and right sided weakness. EEG to evaluate for seizure  Level of alertness: Awake, asleep  AEDs during EEG study: None  Technical aspects: This EEG study was done with scalp electrodes positioned according to the 10-20 International system of electrode placement. Electrical activity was reviewed with band pass filter of 1-70Hz , sensitivity of 7 uV/mm, display speed of 31mm/sec with a 60Hz  notched filter applied as appropriate. EEG data were recorded continuously and digitally stored.  Video monitoring was available and reviewed as appropriate.  Description: The posterior dominant rhythm consists of 8 Hz activity of moderate voltage (25-35 uV) seen predominantly in posterior head regions, symmetric and reactive to eye opening and eye closing. Sleep was characterized by vertex waves, sleep spindles (12 to 14 Hz), maximal frontocentral region. Hyperventilation and photic stimulation were not performed.     IMPRESSION: This study is within normal limits. No seizures or epileptiform discharges were seen throughout the recording.  A normal interictal EEG does not exclude the diagnosis of epilepsy.  Merrie Epler Annabelle Harman

## 2022-12-12 NOTE — Progress Notes (Signed)
Staff at bedside. Tech on standby.

## 2022-12-12 NOTE — ED Notes (Signed)
Pt HR dipping in the 40s. MD notified.

## 2022-12-12 NOTE — ED Notes (Signed)
ED TO INPATIENT HANDOFF REPORT  ED Nurse Name and Phone #: Gustavo Lah, rn [phone]  S Name/Age/Gender Andrew Hunt 82 y.o. male Room/Bed: 046C/046C  Code Status   Code Status: Full Code  Home/SNF/Other Home Patient oriented to: self, place, time, and situation Is this baseline? Yes   Triage Complete: Triage complete  Chief Complaint Acute encephalopathy [G93.40]  Triage Note Code stroke. Here for right sided leg weakness and aphasia. EMS reports it has progressed to now only responding to pain. Unable to follow commands. LKN at 0815. On Eliquis.    Allergies Allergies  Allergen Reactions   Hydrocodone Itching   Oxycodone Itching   Vioxx [Rofecoxib] Swelling and Other (See Comments)    Caused mini-strokes    Level of Care/Admitting Diagnosis ED Disposition     ED Disposition  Admit   Condition  --   Comment  Hospital Area: [facility] [[id number]]  Level of Care: Telemetry Medical [104]  May place patient in observation at [facility] or [facility] if equivalent level of care is available:: Yes  Covid Evaluation: Asymptomatic - no recent exposure (last 10 days) testing not required  Diagnosis: Acute encephalopathy [960454]  Admitting Physician: Silvio Pate  Attending Physician: Gust Rung [[id number]]          B Medical/Surgery History Past Medical History:  Diagnosis Date   AAA (abdominal aortic aneurysm) (HCC)    Arthritis    GERD (gastroesophageal reflux disease)    Hyperlipidemia    Hypertension    Lung nodule    TIA (transient ischemic attack)    Past Surgical History:  Procedure Laterality Date   APPENDECTOMY     BACK SURGERY     CHOLECYSTECTOMY     COLONOSCOPY N/A 05/05/2014   Procedure: COLONOSCOPY;  Surgeon: Corbin Ade, MD;  Location: [facility];  Service: Endoscopy;  Laterality: N/A;  100   MULTIPLE TOOTH EXTRACTIONS       A IV Location/Drains/Wounds Patient Lines/Drains/Airways Status     Active  Line/Drains/Airways     Name Placement date Placement time Site Days   Peripheral IV 12/12/22 18 G Left Antecubital 12/12/22  0938  Antecubital  less than 1            Intake/Output Last 24 hours No intake or output data in the 24 hours ending 12/12/22 1626  Labs/Imaging Results for orders placed or performed during the hospital encounter of 12/12/22 (from the past 48 hour(s))  CBG monitoring, ED     Status: Abnormal   Collection Time: 12/12/22  9:37 AM  Result Value Ref Range   Glucose-Capillary 119 (H) 70 - 99 mg/dL    Comment: Glucose reference range applies only to samples taken after fasting for at least 8 hours.  Protime-INR     Status: None   Collection Time: 12/12/22  9:43 AM  Result Value Ref Range   Prothrombin Time 13.6 11.4 - 15.2 seconds   INR 1.0 0.8 - 1.2    Comment: (NOTE) INR goal varies based on device and disease states. Performed at [facility], [address], [place], [place] [address]   APTT     Status: Abnormal   Collection Time: 12/12/22  9:43 AM  Result Value Ref Range   aPTT 42 (H) 24 - 36 seconds    Comment:        IF BASELINE aPTT IS ELEVATED, SUGGEST PATIENT RISK ASSESSMENT BE USED TO DETERMINE APPROPRIATE ANTICOAGULANT  THERAPY. Performed at [facility], [address] [place], [place] [address]   CBC     Status: None   Collection Time: 12/12/22  9:43 AM  Result Value Ref Range   WBC 8.9 4.0 - 10.5 K/uL   RBC 4.66 4.22 - 5.81 MIL/uL   Hemoglobin 14.6 13.0 - 17.0 g/dL   HCT 10.2 72.5 - 36.6 %   MCV 92.5 80.0 - 100.0 fL   MCH 31.3 26.0 - 34.0 pg   MCHC 33.9 30.0 - 36.0 g/dL   RDW 44.0 34.7 - 42.5 %   Platelets 180 150 - 400 K/uL   nRBC 0.0 0.0 - 0.2 %    Comment: Performed at [facility], [address] [place], [place] [address]  Differential     Status: None   Collection Time: 12/12/22  9:43 AM  Result Value Ref Range   Neutrophils Relative % 66 %   Neutro Abs 6.0 1.7 - 7.7 K/uL   Lymphocytes Relative 22  %   Lymphs Abs 1.9 0.7 - 4.0 K/uL   Monocytes Relative 8 %   Monocytes Absolute 0.7 0.1 - 1.0 K/uL   Eosinophils Relative 2 %   Eosinophils Absolute 0.1 0.0 - 0.5 K/uL   Basophils Relative 1 %   Basophils Absolute 0.1 0.0 - 0.1 K/uL   Immature Granulocytes 1 %   Abs Immature Granulocytes 0.04 0.00 - 0.07 K/uL    Comment: Performed at [facility], [address], [place], [place] [address]  Comprehensive metabolic panel     Status: Abnormal   Collection Time: 12/12/22  9:43 AM  Result Value Ref Range   Sodium 132 (L) 135 - 145 mmol/L   Potassium 4.2 3.5 - 5.1 mmol/L   Chloride 98 98 - 111 mmol/L   CO2 22 22 - 32 mmol/L   Glucose, Bld 122 (H) 70 - 99 mg/dL    Comment: Glucose reference range applies only to samples taken after fasting for at least 8 hours.   BUN 22 8 - 23 mg/dL   Creatinine, Ser 3.29 (H) 0.61 - 1.24 mg/dL   Calcium 9.6 8.9 - 51.8 mg/dL   Total Protein 7.7 6.5 - 8.1 g/dL   Albumin 3.8 3.5 - 5.0 g/dL   AST 19 15 - 41 U/L   ALT 17 0 - 44 U/L   Alkaline Phosphatase 59 38 - 126 U/L   Total Bilirubin 0.9 0.3 - 1.2 mg/dL   GFR, Estimated 55 (L) >60 mL/min    Comment: (NOTE) Calculated using the CKD-EPI Creatinine Equation (2021)    Anion gap 12 5 - 15    Comment: Performed at [facility], [address], [place], [place] [address]  Ethanol     Status: None   Collection Time: 12/12/22  9:43 AM  Result Value Ref Range   Alcohol, Ethyl (B) <10 <10 mg/dL    Comment: (NOTE) Lowest detectable limit for serum alcohol is 10 mg/dL.  For medical purposes only. Performed at [facility], [address] [place], [place] [address]   I-stat chem 8, ED     Status: Abnormal   Collection Time: 12/12/22  9:43 AM  Result Value Ref Range   Sodium 135 135 - 145 mmol/L   Potassium 4.3 3.5 - 5.1 mmol/L   Chloride 101 98 - 111 mmol/L   BUN 28 (H) 8 - 23 mg/dL   Creatinine, Ser 6.01 (H) 0.61 - 1.24  mg/dL   Glucose, Bld 469 (H) 70 - 99 mg/dL    Comment:  Glucose reference range applies only to samples taken after fasting for at least 8 hours.   Calcium, Ion 1.21 1.15 - 1.40 mmol/L   TCO2 24 22 - 32 mmol/L   Hemoglobin 15.0 13.0 - 17.0 g/dL   HCT 62.9 52.8 - 41.3 %  Urine rapid drug screen (hosp performed)     Status: None   Collection Time: 12/12/22 10:28 AM  Result Value Ref Range   Opiates NONE DETECTED NONE DETECTED   Cocaine NONE DETECTED NONE DETECTED   Benzodiazepines NONE DETECTED NONE DETECTED   Amphetamines NONE DETECTED NONE DETECTED   Tetrahydrocannabinol NONE DETECTED NONE DETECTED   Barbiturates NONE DETECTED NONE DETECTED    Comment: (NOTE) DRUG SCREEN FOR MEDICAL PURPOSES ONLY.  IF CONFIRMATION IS NEEDED FOR ANY PURPOSE, NOTIFY LAB WITHIN 5 DAYS.  LOWEST DETECTABLE LIMITS FOR URINE DRUG SCREEN Drug Class                     Cutoff (ng/mL) Amphetamine and metabolites    1000 Barbiturate and metabolites    200 Benzodiazepine                 200 Opiates and metabolites        300 Cocaine and metabolites        300 THC                            50 Performed at [facility], [address], [place], [place] [address]   I-Stat arterial blood gas, ED     Status: Abnormal   Collection Time: 12/12/22 10:41 AM  Result Value Ref Range   pH, Arterial 7.377 7.35 - 7.45   pCO2 arterial 40.6 32 - 48 mmHg   pO2, Arterial 62 (L) 83 - 108 mmHg   Bicarbonate 24.0 20.0 - 28.0 mmol/L   TCO2 25 22 - 32 mmol/L   O2 Saturation 92 %   Acid-base deficit 1.0 0.0 - 2.0 mmol/L   Sodium 134 (L) 135 - 145 mmol/L   Potassium 3.8 3.5 - 5.1 mmol/L   Calcium, Ion 1.29 1.15 - 1.40 mmol/L   HCT 42.0 39.0 - 52.0 %   Hemoglobin 14.3 13.0 - 17.0 g/dL   Patient temperature 02.7 F    Collection site RADIAL, ALLEN'S TEST ACCEPTABLE    Drawn by RT    Sample type ARTERIAL   Urinalysis, w/ Reflex to Culture (Infection Suspected) -Urine, Unspecified Source     Status: Abnormal   Collection Time: 12/12/22 10:53 AM  Result Value Ref Range    Specimen Source URINE, UNSPE    Color, Urine YELLOW YELLOW   APPearance CLEAR CLEAR   Specific Gravity, Urine 1.014 1.005 - 1.030   pH 6.0 5.0 - 8.0   Glucose, UA NEGATIVE NEGATIVE mg/dL   Hgb urine dipstick LARGE (A) NEGATIVE   Bilirubin Urine NEGATIVE NEGATIVE   Ketones, ur NEGATIVE NEGATIVE mg/dL   Protein, ur NEGATIVE NEGATIVE mg/dL   Nitrite NEGATIVE NEGATIVE   Leukocytes,Ua NEGATIVE NEGATIVE   RBC / HPF >50 0 - 5 RBC/hpf   WBC, UA 0-5 0 - 5 WBC/hpf    Comment:        Reflex urine culture not performed if WBC <=10, OR if Squamous epithelial cells >5. If Squamous epithelial cells >5 suggest recollection.    Bacteria,  UA NONE SEEN NONE SEEN   Squamous Epithelial / HPF 0-5 0 - 5 /HPF    Comment: Performed at [facility], [address] [place], [place] [address]   EEG adult  Result Date: 12/12/2022 Charlsie Quest, MD     12/12/2022 12:45 PM Patient Name: Andrew Hunt MRN: [id number] Epilepsy Attending: Charlsie Quest Referring Physician/Provider: Elmer Picker, NP Date: 12/12/2022 Duration: 24.28 mins Patient history: 82 y.o. male with a past medical history of DVT on eliquis, AAA, arthritis, GERD, HTN, HLD, and TIA presenting with aphasia and right sided weakness. EEG to evaluate for seizure Level of alertness: Awake, asleep AEDs during EEG study: None Technical aspects: This EEG study was done with scalp electrodes positioned according to the 10-20 International system of electrode placement. Electrical activity was reviewed with band pass filter of 1-70Hz , sensitivity of 7 uV/mm, display speed of 62mm/sec with a 60Hz  notched filter applied as appropriate. EEG data were recorded continuously and digitally stored.  Video monitoring was available and reviewed as appropriate. Description: The posterior dominant rhythm consists of 8 Hz activity of moderate voltage (25-35 uV) seen predominantly in posterior head regions, symmetric and reactive to eye opening and eye closing.  Sleep was characterized by vertex waves, sleep spindles (12 to 14 Hz), maximal frontocentral region. Hyperventilation and photic stimulation were not performed.   IMPRESSION: This study is within normal limits. No seizures or epileptiform discharges were seen throughout the recording. A normal interictal EEG does not exclude the diagnosis of epilepsy. Charlsie Quest   DG Chest Portable 1 View  Result Date: 12/12/2022 CLINICAL DATA:  Altered mental status. EXAM: PORTABLE CHEST 1 VIEW COMPARISON:  06/20/2022 FINDINGS: Stable mild-to-moderate cardiomegaly. Both lungs are clear. No evidence of pleural effusion. IMPRESSION: Cardiomegaly. No active lung disease. Electronically Signed   By: Danae Orleans M.D.   On: 12/12/2022 11:29   MR BRAIN WO CONTRAST  Result Date: 12/12/2022 CLINICAL DATA:  Right-sided weakness and aphasia. EXAM: MRI HEAD WITHOUT CONTRAST TECHNIQUE: Multiplanar, multiecho pulse sequences of the brain and surrounding structures were obtained without intravenous contrast. COMPARISON:  Same-day head CT FINDINGS: A truncated protocol was performed. Axial and coronal DWI, axial FLAIR, and axial T2 images were obtained. The provided sequences are moderately motion degraded. Brain: There is no evidence of acute intracranial hemorrhage, extra-axial fluid collection, or acute infarct. Parenchymal volume is within expected limits. The ventricles are normal in size. Patchy FLAIR signal abnormality in the supratentorial white matter and pons likely reflects sequela of underlying chronic small-vessel ischemic change. Small remote infarcts are seen in the bilateral corona radiata, basal ganglia, and thalami. There is no solid mass lesion there is no mass effect or midline shift. Vascular: Normal flow voids. Skull and upper cervical spine: Suboptimally assessed on the provided sequences. Sinuses/Orbits: The paranasal sinuses are clear. The globes and orbits are unremarkable. Other: The mastoid air cells and  middle ear cavities are clear. IMPRESSION: No acute intracranial pathology. Electronically Signed   By: Lesia Hausen M.D.   On: 12/12/2022 10:31   CT ANGIO HEAD NECK W WO CM (CODE STROKE)  Result Date: 12/12/2022 CLINICAL DATA:  Code stroke EXAM: CT ANGIOGRAPHY HEAD AND NECK WITH AND WITHOUT CONTRAST TECHNIQUE: Multidetector CT imaging of the head and neck was performed using the standard protocol during bolus administration of intravenous contrast. Multiplanar CT image reconstructions and MIPs were obtained to evaluate the vascular anatomy. Carotid stenosis measurements (when applicable) are obtained utilizing NASCET criteria, using  the distal internal carotid diameter as the denominator. RADIATION DOSE REDUCTION: This exam was performed according to the departmental dose-optimization program which includes automated exposure control, adjustment of the mA and/or kV according to patient size and/or use of iterative reconstruction technique. CONTRAST:  75mL OMNIPAQUE IOHEXOL 350 MG/ML SOLN COMPARISON:  Same-day CT head, carotid Doppler 07/17/2019 FINDINGS: CTA NECK FINDINGS Aortic arch: There is mild calcified plaque in the imaged aortic arch. The origins of the major branch vessels are patent. The subclavian arteries are patent to the level imaged. Right carotid system: The right common, internal, and external carotid arteries are patent, with mild plaque at the bifurcation but no hemodynamically significant stenosis or occlusion. There is no evidence of dissection or aneurysm. There is a medialized course of the right common and internal carotid arteries. Left carotid system: The left common, internal, and external carotid arteries are patent, with mild plaque of the bifurcation but no hemodynamically significant stenosis or occlusion. There is no evidence of dissection or aneurysm. There is a medialized course of the common and internal carotid arteries. Vertebral arteries: The vertebral arteries are patent,  without hemodynamically significant stenosis or occlusion. There is no evidence of dissection or aneurysm. Skeleton: There is no acute osseous abnormality or suspicious osseous lesion. There is no visible canal hematoma. Other neck: There are soft tissue nodules in both parotid glands measuring up 2 1.1 cm x 1.4 cm inferiorly on the right, 1.2 cm x 0.9 cm more superiorly on the right, and 1.9 cm x 1.4 cm on the left. 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 Upper chest: A calcified granuloma is noted in the right upper lobe. Review of the MIP images confirms the above findings CTA HEAD FINDINGS Anterior circulation: There is calcified plaque in the intracranial ICAs resulting in mild-to-moderate stenosis bilaterally. Note that the petrous segments are suboptimally evaluated due to streak artifact and bolus timing. The bilateral MCAs are patent, without proximal stenosis or occlusion. 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 There is short-segment moderate stenosis of the left A2 segment (7-66, 8-144). The ACAS are otherwise patent, without other proximal stenosis or occlusion. The anterior communicating artery is normal. There is no aneurysm or AVM. Posterior circulation: The bilateral V4 segments are patent with mild plaque but no significant stenosis or occlusion. The basilar artery is patent. The major cerebellar arteries appear patent. The bilateral PCAs are patent, without proximal stenosis or occlusion. Posterior communicating arteries are not identified. There is no aneurysm or AVM. Venous sinuses: Not well evaluated due to bolus timing. Anatomic variants: As above. Review of the MIP images confirms the above findings IMPRESSION: 1. No emergent vascular finding. 2. Mild plaque at both carotid bifurcations without significant stenosis or occlusion. 3. 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 4. 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 Findings communicated to Dr Wilford Corner at 10:08 am Electronically Signed   By: Lesia Hausen M.D.   On: 12/12/2022 10:12   CT HEAD CODE STROKE WO CONTRAST  Result Date: 12/12/2022 CLINICAL DATA:  Code stroke.  Neuro deficit EXAM: CT HEAD WITHOUT CONTRAST TECHNIQUE: Contiguous axial images were obtained from the base of the skull through the vertex without intravenous contrast. RADIATION DOSE REDUCTION: This exam was performed according to the departmental dose-optimization program which includes automated exposure control, adjustment of the mA and/or  kV according to patient size and/or use of iterative reconstruction technique. COMPARISON:  CT Head 12/18/19 FINDINGS: Limitations: Motion degraded exam Brain: No hemorrhage. No hydrocephalus. No extra-axial fluid collection. Sequela of severe chronic microvascular ischemic change. 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 Vascular: Apparent asymmetrically hyperdense appearance of the left MCA is likely artifactual. Skull: Normal. Negative for fracture or focal lesion. Sinuses/Orbits: 2 no middle ear or mastoid effusion. Paranasal sinuses are clear. Orbits are unremarkable. Other: None. ASPECTS [facility] Stroke Program Early CT Score): 10 when accounting for chronic findings. IMPRESSION: Motion degraded exam. Within this limitation, no hemorrhage or CT evidence of an acute cortical infarct. Aspects is 10 when accounting for chronic findings Findings were paged to Dr. Wilford Corner on 12/12/2022 at 9:49 a.m via [facility] paging system. Electronically Signed   By: Lorenza Cambridge M.D.   On: 12/12/2022 09:52    Pending Labs Unresulted Labs (From admission, onward)    None       Vitals/Pain Today's Vitals   12/12/22 1300 12/12/22 1343 12/12/22 1419 12/12/22 1430  BP: 137/66  (!) 145/72 (!) 147/75  Pulse: (!) 46   (!) 47 (!) 47  Resp: 18  16 15   Temp:  98 F (36.7 C) (!) 97.5 F (36.4 C)   TempSrc:  Oral Axillary   SpO2: 97%  93% 99%  Weight:      Height:      PainSc:        Isolation Precautions No active isolations  Medications Medications  sodium chloride flush (NS) 0.9 % injection 3 mL (3 mLs Intravenous Not Given 12/12/22 1057)  apixaban (ELIQUIS) tablet 5 mg (has no administration in time range)  atorvastatin (LIPITOR) tablet 40 mg (has no administration in time range)  magnesium oxide (MAG-OX) tablet 400 mg (has no administration in time range)  pantoprazole (PROTONIX) EC tablet 40 mg (has no administration in time range)  iohexol (OMNIPAQUE) 350 MG/ML injection 75 mL (75 mLs Intravenous Contrast Given 12/12/22 0958)    Mobility walks     Focused Assessments Neuro Assessment Handoff:  Swallow screen pass? Yes  Cardiac Rhythm: Sinus bradycardia NIH Stroke Scale  Interval: Initial Level of Consciousness (1a.)   : Not alert, requires repeated stimulation to attend, or is obtunded and requires strong or painful stimulation to make movements (not stereotyped) LOC Questions (1b. )   : Answers neither question correctly LOC Commands (1c. )   : Performs neither task correctly Best Gaze (2. )  : Partial gaze palsy Visual (3. )  : Complete hemianopia Facial Palsy (4. )    : Normal symmetrical movements Motor Arm, Left (5a. )   : Drift Motor Arm, Right (5b. ) : No effort against gravity Motor Leg, Left (6a. )  : Some effort against gravity Motor Leg, Right (6b. ) : Some effort against gravity Limb Ataxia (7. ): Absent Sensory (8. )  : Normal, no sensory loss Best Language (9. )  : Mute, global aphasia Dysarthria (10. ): Severe dysarthria, patient's speech is so slurred as to be unintelligible in the absence of or out of proportion to any dysphasia, or is mute/anarthric Extinction/Inattention (11.)   : No Abnormality Complete NIHSS TOTAL: 22 Last date known well: 12/12/22 Last time  known well: 0815 Neuro Assessment:   Neuro Checks:   Initial (12/12/22 0945)  Has TPA been given? No If patient is a Neuro Trauma and patient is going to OR before floor call report to [employer]  nurse: [phone] or [phone]   R Recommendations: See Admitting Provider Note  Report given to:   Additional Notes: speech still slightly slurred

## 2022-12-12 NOTE — ED Provider Notes (Signed)
EMERGENCY DEPARTMENT AT [facility] Provider Note   CSN: [id number] Arrival date & time: 12/12/22  1601  An emergency department physician performed an initial assessment on this suspected stroke patient at [phone].  History  Chief Complaint  Patient presents with   Code Stroke    Andrew Hunt is a 82 y.o. male.  HPI Patient presents as a code stroke.  Reportedly right leg weakness initially.  Then developed aphasia.  Now decreased responsiveness.  Not following commands.  Appears to be moving all extremities however.  Met by myself and Dr. Wilford Corner and the neurology team at the bridge upon arrival.  Reportedly last normal at 815.  Witnessed by family members.  Is on Eliquis and reportedly took it this morning.   Past Medical History:  Diagnosis Date   AAA (abdominal aortic aneurysm) (HCC)    Arthritis    GERD (gastroesophageal reflux disease)    Hyperlipidemia    Hypertension    Lung nodule    TIA (transient ischemic attack)     Home Medications Prior to Admission medications   Medication Sig Start Date End Date Taking? Authorizing Provider  acetaminophen (TYLENOL) 325 MG tablet Take 2 tablets (650 mg total) by mouth every 6 (six) hours as needed for mild pain, fever or headache (or Fever >/= 101). 04/09/18  Yes Emokpae, Courage, MD  amLODipine (NORVASC) 10 MG tablet Take 10 mg by mouth daily.   Yes [provider]  apixaban (ELIQUIS) 5 MG TABS tablet Take 1 tablet (5 mg total) by mouth 2 (two) times daily. Start on 01/272020 after completing the starter pack Patient taking differently: Take 5 mg by mouth at bedtime. 04/16/18  Yes Emokpae, Courage, MD  atorvastatin (LIPITOR) 40 MG tablet Take 40 mg by mouth daily.   Yes [provider]  furosemide (LASIX) 40 MG tablet Take 1 tablet (40 mg total) by mouth daily. 07/25/22  Yes Corky Crafts, MD  Glucosamine-Chondroit-Vit C-Mn (GLUCOSAMINE 1500 COMPLEX PO) Take 1,500 mg by mouth daily.    Yes [provider]  hydrochlorothiazide (HYDRODIURIL) 12.5 MG tablet Take 1 tablet (12.5 mg total) by mouth daily. Patient taking differently: Take 6.25 mg by mouth daily. 12/05/22 03/05/23 Yes Corky Crafts, MD  magnesium oxide (MAG-OX) 400 MG tablet Take 1 tablet (400 mg total) by mouth 2 (two) times daily. 08/25/22  Yes Corky Crafts, MD  nebivolol (BYSTOLIC) 5 MG tablet Take 1 tablet (5 mg total) by mouth daily. 10/30/22  Yes Corky Crafts, MD  omeprazole (PRILOSEC) 20 MG capsule Take 20 mg by mouth daily.   Yes [provider]  tamsulosin (FLOMAX) 0.4 MG CAPS capsule Take 0.4 mg by mouth 2 (two) times daily.   Yes [provider]  telmisartan (MICARDIS) 80 MG tablet Take 1 tablet (80 mg total) by mouth daily. 10/30/22  Yes Corky Crafts, MD  tiZANidine (ZANAFLEX) 4 MG tablet Take 2-4 mg by mouth at bedtime as needed. 01/01/22  Yes [provider]      Allergies    Hydrocodone, Oxycodone, and Vioxx [rofecoxib]    Review of Systems   Review of Systems  Physical Exam Updated Vital Signs BP 137/66   Pulse (!) 46   Temp 98 F (36.7 C) (Oral)   Resp 18   Ht 5\' 6"  (1.676 m)   Wt 77.8 kg   SpO2 97%   BMI 27.68 kg/m  Physical Exam Vitals reviewed.  Cardiovascular:  Rate and Rhythm: Regular rhythm.  Pulmonary:     Breath sounds: No wheezing.  Abdominal:     Tenderness: There is no abdominal tenderness.  Musculoskeletal:     Cervical back: Neck supple.  Neurological:     Comments: Complete NIH scoring done by neurology.  Moving all extremities.  Will look around.  However nonverbal.  Will not follow commands.  Breathing spontaneously.     ED Results / Procedures / Treatments   Labs (all labs ordered are listed, but only abnormal results are displayed) Labs Reviewed  APTT - Abnormal; Notable for the following components:      Result Value   aPTT 42 (*)    All other components within normal limits   COMPREHENSIVE METABOLIC PANEL - Abnormal; Notable for the following components:   Sodium 132 (*)    Glucose, Bld 122 (*)    Creatinine, Ser 1.29 (*)    GFR, Estimated 55 (*)    All other components within normal limits  URINALYSIS, W/ REFLEX TO CULTURE (INFECTION SUSPECTED) - Abnormal; Notable for the following components:   Hgb urine dipstick LARGE (*)    All other components within normal limits  I-STAT CHEM 8, ED - Abnormal; Notable for the following components:   BUN 28 (*)    Creatinine, Ser 1.30 (*)    Glucose, Bld 121 (*)    All other components within normal limits  CBG MONITORING, ED - Abnormal; Notable for the following components:   Glucose-Capillary 119 (*)    All other components within normal limits  I-STAT ARTERIAL BLOOD GAS, ED - Abnormal; Notable for the following components:   pO2, Arterial 62 (*)    Sodium 134 (*)    All other components within normal limits  PROTIME-INR  CBC  DIFFERENTIAL  ETHANOL  RAPID URINE DRUG SCREEN, HOSP PERFORMED  BLOOD GAS, ARTERIAL    EKG None  Radiology EEG adult  Result Date: 12/12/2022 Charlsie Quest, MD     12/12/2022 12:45 PM Patient Name: Andrew Hunt MRN: [id number] Epilepsy Attending: Charlsie Quest Referring Physician/Provider: Elmer Picker, NP Date: 12/12/2022 Duration: 24.28 mins Patient history: 82 y.o. male with a past medical history of DVT on eliquis, AAA, arthritis, GERD, HTN, HLD, and TIA presenting with aphasia and right sided weakness. EEG to evaluate for seizure Level of alertness: Awake, asleep AEDs during EEG study: None Technical aspects: This EEG study was done with scalp electrodes positioned according to the 10-20 International system of electrode placement. Electrical activity was reviewed with band pass filter of 1-70Hz , sensitivity of 7 uV/mm, display speed of 31mm/sec with a 60Hz  notched filter applied as appropriate. EEG data were recorded continuously and digitally stored.  Video monitoring was  available and reviewed as appropriate. Description: The posterior dominant rhythm consists of 8 Hz activity of moderate voltage (25-35 uV) seen predominantly in posterior head regions, symmetric and reactive to eye opening and eye closing. Sleep was characterized by vertex waves, sleep spindles (12 to 14 Hz), maximal frontocentral region. Hyperventilation and photic stimulation were not performed.   IMPRESSION: This study is within normal limits. No seizures or epileptiform discharges were seen throughout the recording. A normal interictal EEG does not exclude the diagnosis of epilepsy. Charlsie Quest   DG Chest Portable 1 View  Result Date: 12/12/2022 CLINICAL DATA:  Altered mental status. EXAM: PORTABLE CHEST 1 VIEW COMPARISON:  06/20/2022 FINDINGS: Stable mild-to-moderate cardiomegaly. Both lungs are clear. No evidence of pleural effusion. IMPRESSION: Cardiomegaly. No  active lung disease. Electronically Signed   By: Danae Orleans M.D.   On: 12/12/2022 11:29   MR BRAIN WO CONTRAST  Result Date: 12/12/2022 CLINICAL DATA:  Right-sided weakness and aphasia. EXAM: MRI HEAD WITHOUT CONTRAST TECHNIQUE: Multiplanar, multiecho pulse sequences of the brain and surrounding structures were obtained without intravenous contrast. COMPARISON:  Same-day head CT FINDINGS: A truncated protocol was performed. Axial and coronal DWI, axial FLAIR, and axial T2 images were obtained. The provided sequences are moderately motion degraded. Brain: There is no evidence of acute intracranial hemorrhage, extra-axial fluid collection, or acute infarct. Parenchymal volume is within expected limits. The ventricles are normal in size. Patchy FLAIR signal abnormality in the supratentorial white matter and pons likely reflects sequela of underlying chronic small-vessel ischemic change. Small remote infarcts are seen in the bilateral corona radiata, basal ganglia, and thalami. There is no solid mass lesion there is no mass effect or midline  shift. Vascular: Normal flow voids. Skull and upper cervical spine: Suboptimally assessed on the provided sequences. Sinuses/Orbits: The paranasal sinuses are clear. The globes and orbits are unremarkable. Other: The mastoid air cells and middle ear cavities are clear. IMPRESSION: No acute intracranial pathology. Electronically Signed   By: Lesia Hausen M.D.   On: 12/12/2022 10:31   CT ANGIO HEAD NECK W WO CM (CODE STROKE)  Result Date: 12/12/2022 CLINICAL DATA:  Code stroke EXAM: CT ANGIOGRAPHY HEAD AND NECK WITH AND WITHOUT CONTRAST TECHNIQUE: Multidetector CT imaging of the head and neck was performed using the standard protocol during bolus administration of intravenous contrast. Multiplanar CT image reconstructions and MIPs were obtained to evaluate the vascular anatomy. Carotid stenosis measurements (when applicable) are obtained utilizing NASCET criteria, using the distal internal carotid diameter as the denominator. RADIATION DOSE REDUCTION: This exam was performed according to the departmental dose-optimization program which includes automated exposure control, adjustment of the mA and/or kV according to patient size and/or use of iterative reconstruction technique. CONTRAST:  75mL OMNIPAQUE IOHEXOL 350 MG/ML SOLN COMPARISON:  Same-day CT head, carotid Doppler 07/17/2019 FINDINGS: CTA NECK FINDINGS Aortic arch: There is mild calcified plaque in the imaged aortic arch. The origins of the major branch vessels are patent. The subclavian arteries are patent to the level imaged. Right carotid system: The right common, internal, and external carotid arteries are patent, with mild plaque at the bifurcation but no hemodynamically significant stenosis or occlusion. There is no evidence of dissection or aneurysm. There is a medialized course of the right common and internal carotid arteries. Left carotid system: The left common, internal, and external carotid arteries are patent, with mild plaque of the  bifurcation but no hemodynamically significant stenosis or occlusion. There is no evidence of dissection or aneurysm. There is a medialized course of the common and internal carotid arteries. Vertebral arteries: The vertebral arteries are patent, without hemodynamically significant stenosis or occlusion. There is no evidence of dissection or aneurysm. Skeleton: There is no acute osseous abnormality or suspicious osseous lesion. There is no visible canal hematoma. Other neck: There are soft tissue nodules in both parotid glands measuring up 2 1.1 cm x 1.4 cm inferiorly on the right, 1.2 cm x 0.9 cm more superiorly on the right, and 1.9 cm x 1.4 cm on the left. 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 Upper chest: A calcified granuloma is noted in the right upper lobe. Review of the MIP images confirms the above findings CTA HEAD FINDINGS Anterior  circulation: There is calcified plaque in the intracranial ICAs resulting in mild-to-moderate stenosis bilaterally. Note that the petrous segments are suboptimally evaluated due to streak artifact and bolus timing. The bilateral MCAs are patent, without proximal stenosis or occlusion. 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 There is short-segment moderate stenosis of the left A2 segment (7-66, 8-144). The ACAS are otherwise patent, without other proximal stenosis or occlusion. The anterior communicating artery is normal. There is no aneurysm or AVM. Posterior circulation: The bilateral V4 segments are patent with mild plaque but no significant stenosis or occlusion. The basilar artery is patent. The major cerebellar arteries appear patent. The bilateral PCAs are patent, without proximal stenosis or occlusion. Posterior communicating arteries are not identified. There is no aneurysm or AVM. Venous sinuses: Not well evaluated due to bolus timing. Anatomic variants: As above. Review of the MIP images  confirms the above findings IMPRESSION: 1. No emergent vascular finding. 2. Mild plaque at both carotid bifurcations without significant stenosis or occlusion. 3. 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 4. 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 Findings communicated to Dr Wilford Corner at 10:08 am Electronically Signed   By: Lesia Hausen M.D.   On: 12/12/2022 10:12   CT HEAD CODE STROKE WO CONTRAST  Result Date: 12/12/2022 CLINICAL DATA:  Code stroke.  Neuro deficit EXAM: CT HEAD WITHOUT CONTRAST TECHNIQUE: Contiguous axial images were obtained from the base of the skull through the vertex without intravenous contrast. RADIATION DOSE REDUCTION: This exam was performed according to the departmental dose-optimization program which includes automated exposure control, adjustment of the mA and/or kV according to patient size and/or use of iterative reconstruction technique. COMPARISON:  CT Head 12/18/19 FINDINGS: Limitations: Motion degraded exam Brain: No hemorrhage. No hydrocephalus. No extra-axial fluid collection. Sequela of severe chronic microvascular ischemic change. 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 Vascular: Apparent asymmetrically hyperdense appearance of the left MCA is likely artifactual. Skull: Normal. Negative for fracture or focal lesion. Sinuses/Orbits: 2 no middle ear or mastoid effusion. Paranasal sinuses are clear. Orbits are unremarkable. Other: None. ASPECTS [facility] Stroke Program Early CT Score): 10 when accounting for chronic findings. IMPRESSION: Motion degraded exam. Within this limitation, no hemorrhage or CT evidence of an acute cortical infarct. Aspects is 10 when accounting for chronic findings Findings were paged to Dr. Wilford Corner on 12/12/2022 at 9:49 a.m via [facility] paging system. Electronically Signed   By: Lorenza Cambridge M.D.    On: 12/12/2022 09:52    Procedures Procedures    Medications Ordered in ED Medications  sodium chloride flush (NS) 0.9 % injection 3 mL (3 mLs Intravenous Not Given 12/12/22 1057)  iohexol (OMNIPAQUE) 350 MG/ML injection 75 mL (75 mLs Intravenous Contrast Given 12/12/22 1610)    ED Course/ Medical Decision Making/ A&P                                 Medical Decision Making Amount and/or Complexity of Data Reviewed Labs: ordered. Radiology: ordered.  Risk Decision regarding hospitalization.   Patient came as a code stroke.  Mental status change.  Initially lateralizing to right with aphasia.  Weakness has improved.  Still aphasic however.  Initial head CT and CTA reassuring.  Independently interpreted head CT without bleed.  Taken emergently to MRI by neurology.  Mental status  has improved somewhat.  Still confused.  Nonfocal however.  Not a TNK candidate.  Has reassuring labs.  Mild bradycardia.  Will admit to hospital for further workup.  Differential diagnosis does include various causes of encephalopathy.    12CRITICAL CARE Performed by: Benjiman Core Total critical care time: 30 for minutes Critical care time was exclusive of separately billable procedures and treating other patients. Critical care was necessary to treat or prevent imminent or life-threatening deterioration. Critical care was time spent personally by me on the following activities: development of treatment plan with patient and/or surrogate as well as nursing, discussions with consultants, evaluation of patient's response to treatment, examination of patient, obtaining history from patient or surrogate, ordering and performing treatments and interventions, ordering and review of laboratory studies, ordering and review of radiographic studies, pulse oximetry and re-evaluation of patient's condition.          Final Clinical Impression(s) / ED Diagnoses Final diagnoses:  Encephalopathy    Rx / DC  Orders ED Discharge Orders     None         Benjiman Core, MD 12/12/22 1356

## 2022-12-13 ENCOUNTER — Other Ambulatory Visit (HOSPITAL_COMMUNITY): Payer: Self-pay

## 2022-12-13 ENCOUNTER — Observation Stay (HOSPITAL_BASED_OUTPATIENT_CLINIC_OR_DEPARTMENT_OTHER): Payer: Medicare Other

## 2022-12-13 DIAGNOSIS — G459 Transient cerebral ischemic attack, unspecified: Secondary | ICD-10-CM | POA: Diagnosis not present

## 2022-12-13 DIAGNOSIS — G9340 Encephalopathy, unspecified: Secondary | ICD-10-CM | POA: Diagnosis not present

## 2022-12-13 DIAGNOSIS — R001 Bradycardia, unspecified: Secondary | ICD-10-CM

## 2022-12-13 LAB — ECHOCARDIOGRAM COMPLETE
AR max vel: 2.04 cm2
AV Area VTI: 2.02 cm2
AV Area mean vel: 1.97 cm2
AV Mean grad: 6 mmHg
AV Peak grad: 12.1 mmHg
Ao pk vel: 1.74 m/s
Area-P 1/2: 2.76 cm2
Height: 66 in
S' Lateral: 3.2 cm
Weight: 2712.54 [oz_av]

## 2022-12-13 LAB — BASIC METABOLIC PANEL
Anion gap: 10 (ref 5–15)
BUN: 27 mg/dL — ABNORMAL HIGH (ref 8–23)
CO2: 25 mmol/L (ref 22–32)
Calcium: 8.9 mg/dL (ref 8.9–10.3)
Chloride: 102 mmol/L (ref 98–111)
Creatinine, Ser: 1.61 mg/dL — ABNORMAL HIGH (ref 0.61–1.24)
GFR, Estimated: 42 mL/min — ABNORMAL LOW (ref >60)
Glucose, Bld: 128 mg/dL — ABNORMAL HIGH (ref 70–99)
Potassium: 3.5 mmol/L (ref 3.5–5.1)
Sodium: 137 mmol/L (ref 135–145)

## 2022-12-13 LAB — GLUCOSE, CAPILLARY: Glucose-Capillary: 138 mg/dL — ABNORMAL HIGH (ref 70–99)

## 2022-12-13 LAB — LIPID PANEL
Cholesterol: 209 mg/dL — ABNORMAL HIGH (ref 0–200)
HDL: 26 mg/dL — ABNORMAL LOW (ref >40)
LDL Cholesterol: 139 mg/dL — ABNORMAL HIGH (ref 0–99)
Total CHOL/HDL Ratio: 8 ratio
Triglycerides: 221 mg/dL — ABNORMAL HIGH (ref <150)
VLDL: 44 mg/dL — ABNORMAL HIGH (ref 0–40)

## 2022-12-13 LAB — HEMOGLOBIN A1C
Hgb A1c MFr Bld: 6.3 % — ABNORMAL HIGH (ref 4.8–5.6)
Mean Plasma Glucose: 134.11 mg/dL

## 2022-12-13 MED ORDER — ATORVASTATIN CALCIUM 80 MG PO TABS: 80.0000 mg | ORAL_TABLET | Freq: Every day | ORAL | Status: DC

## 2022-12-13 MED ORDER — ATORVASTATIN CALCIUM 80 MG PO TABS
80.0000 mg | ORAL_TABLET | Freq: Every day | ORAL | 0 refills | Status: AC
  Filled 2022-12-13: qty 30, 30d supply, fill #0

## 2022-12-13 MED ORDER — APIXABAN 5 MG PO TABS: 5.0000 mg | ORAL_TABLET | Freq: Two times a day (BID) | ORAL | Status: DC

## 2022-12-13 MED ORDER — APIXABAN 2.5 MG PO TABS
2.5000 mg | ORAL_TABLET | Freq: Two times a day (BID) | ORAL | 11 refills | Status: DC
  Filled 2022-12-13: qty 60, 30d supply, fill #0

## 2022-12-13 MED ORDER — STROKE: EARLY STAGES OF RECOVERY BOOK: Freq: Once | Status: DC

## 2022-12-13 NOTE — Progress Notes (Signed)
Patient sent off floor to vascular for MD ordered echocardiogram. Patient sent via bed escorted by transport staff. NAD. Respirations are even and unlabored. RA.

## 2022-12-13 NOTE — Plan of Care (Signed)

## 2022-12-13 NOTE — TOC CM/SW Note (Signed)
Transition of Care [facility]) - Inpatient Brief Assessment   Patient Details  Name: Andrew Hunt MRN: [id number] Date of Birth: 1940-05-23  [facility] CM/SW Contact:    Kermit Balo, RN Phone Number: 12/13/2022, 1:48 PM   Clinical Narrative: No follow up per PT.   Transition of Care Asessment: Insurance and Status: Insurance coverage has been reviewed Patient has primary care physician: Yes Home environment has been reviewed: home with spouse   Prior/Current Home Services: No current home services Social Determinants of Health Reivew: SDOH reviewed no interventions necessary Readmission risk has been reviewed: Yes Transition of care needs: no transition of care needs at this time

## 2022-12-13 NOTE — Discharge Instructions (Addendum)
Thank you for allowing [place] to be part of your care. You were hospitalized for encephalopathy which we believe was due to low blood flow to the brain. It is possible that you experienced a "mini-stroke" although that is not clear. You also have a low heart rate and have been under stress, which can contribute. Strokes, mini-strokes, and low blood flow are improved by healthy veins and arteries. So for you, it is important to keep blood cholesterol in check, because too much cholesterol can lead to strokes. Your statin (cholesterol-lowing medicine) is important. We will send you home on a different type that may have fewer side effects.  Please contact your primary care provider and your cardiologist to schedule hospital follow-up as soon as possible. Please also call your neurologist, Dr. Shon Millet, to get follow up.   [facility] [address], [address] [place], [place] [address] [phone]  Expect a phone call and/or mail delivery of a Holter monitor. This is a monitor that you will wear on the chest. It will evaluate your heart for about one month to see if you are having arrhythmias that can cause the symptoms you experienced.  Please note these changes made to your medications:   Please START taking:  Atorvastatin 80 mg per day - this is to lower cholesterol, if you have muscle pains please tell your primary care provider and cardiologist  Eliquis - now 2.5 mg twice daily  Please STOP taking:  Magnesium-Oxide until you discuss it with your doctor - we will stop for now which may improve diarrhea Bystolic/Nebivolol - this can make your heart rate low, please discuss with your cardiologist who may make further changes  Please discuss these changes with your doctors, as they know you well and will be able to make additional changes or offer other long-term solutions.

## 2022-12-13 NOTE — Plan of Care (Signed)
Per report - getting somewhat better over time. Had some bradycardia overngiht as well. Consider TIA work up.  OK to continue Eliquis - he is on 5mg  at bedtime - need to clarify why and put him back on appropriate dosing of 5mg  BID  Stroke team will follow  -- Milon Dikes, MD Neurologist [facility] Pager: [phone]

## 2022-12-13 NOTE — Evaluation (Signed)
Physical Therapy Evaluation and Discharge  Patient Details Name: Andrew Hunt MRN: [id number] DOB: 1940/11/10 Today's Date: 12/13/2022  History of Present Illness  Patient is a 82 year old male presenting as code stroke with R leg weakness, aphasia, and decreased responsiveness. History of  DVT/PE on eliquis, AAA, TIA, HTN, HLD. MRI of brain reports no acute intercranial pathology.  Clinical Impression  Patient is agreeable to PT evaluation. He is usually independent with mobility without assistive device. He lives with his spouse with supportive family members nearby.  The patient is Mod I for all activity today. He ambulated in hallway without assistive device with no loss of balance during dual tasking, varying gait speed, and head turns. No focal weakness is noted. The patient and son in the room feel that patient is back to his baseline. No apparent acute PT needs at this time.        If plan is discharge home, recommend the following:     Can travel by private vehicle        Equipment Recommendations None recommended by PT  Recommendations for Other Services       Functional Status Assessment Patient has not had a recent decline in their functional status     Precautions / Restrictions Precautions Precautions: None Restrictions Weight Bearing Restrictions: No      Mobility  Bed Mobility               General bed mobility comments: not assessed as patient sitting up on arrival and post session    Transfers Overall transfer level: Needs assistance Equipment used: None Transfers: Sit to/from Stand Sit to Stand: Modified independent (Device/Increase time)                Ambulation/Gait Ambulation/Gait assistance: Modified independent (Device/Increase time) Gait Distance (Feet): 250 Feet Assistive device: None Gait Pattern/deviations: WFL(Within Functional Limits), Step-through pattern   Gait velocity interpretation: >2.62 ft/sec, indicative of  community ambulatory   General Gait Details: no loss of balance with hallway ambulation, even with dual tasking, with varying speed, head turns. patient feels back to baseline  Stairs            Wheelchair Mobility     Tilt Bed    Modified Rankin (Stroke Patients Only)       Balance   Sitting-balance support: No upper extremity supported, Feet supported Sitting balance-Leahy Scale: Normal     Standing balance support: No upper extremity supported Standing balance-Leahy Scale: Good               High level balance activites: Direction changes, Turns, Sudden stops, Head turns High Level Balance Comments: no loss of balance with high level mobility tasks             Pertinent Vitals/Pain Pain Assessment Pain Assessment: Faces Faces Pain Scale: Hurts a little bit Pain Location: 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 Pain Descriptors / Indicators: Discomfort Pain Intervention(s): Limited activity within patient's tolerance, Monitored during session, Repositioned    Home Living Family/patient expects to be discharged to:: Private residence Living Arrangements: Spouse/significant other Available Help at Discharge: Family;Available 24 hours/day Type of Home: House Home Access: Ramped entrance         Home Equipment: Agricultural consultant (2 wheels);Cane - single point;Shower seat;Wheelchair - power Additional Comments: extensive DME in the home (spouse's)    Prior Function Prior Level of Function : Independent/Modified Independent;Driving  Mobility Comments: independent, drives tractors around his property ADLs Comments: independent     Extremity/Trunk Assessment   Upper Extremity Assessment Upper Extremity Assessment: Overall WFL for tasks assessed (5/5 shoulder flexion, elbow flexion/extension. strong and equal grip strength)    Lower Extremity Assessment Lower Extremity Assessment: Overall WFL for tasks assessed (5/5 shoulder flexion, elbow  flexion/extension. strong and equal grip strength)       Communication   Communication Communication: No apparent difficulties  Cognition Arousal: Alert Behavior During Therapy: WFL for tasks assessed/performed Overall Cognitive Status: Within Functional Limits for tasks assessed                                 General Comments: patient is able to follow all commands without difficulty. alert and oriented x 4        General Comments      Exercises     Assessment/Plan    PT Assessment Patient does not need any further PT services  PT Problem List         PT Treatment Interventions      PT Goals (Current goals can be found in the Care Plan section)  Acute Rehab PT Goals PT Goal Formulation: All assessment and education complete, DC therapy    Frequency       Co-evaluation               AM-PAC PT "6 Clicks" Mobility  Outcome Measure Help needed turning from your back to your side while in a flat bed without using bedrails?: None Help needed moving from lying on your back to sitting on the side of a flat bed without using bedrails?: None Help needed moving to and from a bed to a chair (including a wheelchair)?: None Help needed standing up from a chair using your arms (e.g., wheelchair or bedside chair)?: None Help needed to walk in hospital room?: None Help needed climbing 3-5 steps with a railing? : None 6 Click Score: 24    End of Session Equipment Utilized During Treatment: Gait belt Activity Tolerance: Patient tolerated treatment well Patient left:  (seated on bed, patient/family are requesting to leave bed alarm off) Nurse Communication: Mobility status PT Visit Diagnosis: Muscle weakness (generalized) (M62.81)    Time: 1610-9604 PT Time Calculation (min) (ACUTE ONLY): 16 min   Charges:   PT Evaluation $PT Eval Low Complexity: 1 Low   PT General Charges $$ ACUTE PT VISIT: 1 Visit         Andrew Hunt, PT, MPT  Andrew Hunt 12/13/2022, 10:02 AM

## 2022-12-13 NOTE — Evaluation (Signed)
Speech Language Pathology Evaluation Patient Details Name: DEVLYN PLACEK MRN: [id number] DOB: [birth year] Today's Date: 12/13/2022 Time: 6295-2841 SLP Time Calculation (min) (ACUTE ONLY): 14 min  Problem List:  Patient Active Problem List   Diagnosis Date Noted   Acute encephalopathy 12/12/2022   Pulmonary embolus (HCC) 04/07/2018   Lung nodule    Hypertension    Hyperlipidemia    GERD (gastroesophageal reflux disease)    Arthritis    TIA (transient ischemic attack)    Neck pain    SOB (shortness of breath)    History of colonic polyps    Encounter for screening colonoscopy 04/30/2014   Past Medical History:  Past Medical History:  Diagnosis Date   AAA (abdominal aortic aneurysm) (HCC)    Arthritis    GERD (gastroesophageal reflux disease)    Hyperlipidemia    Hypertension    Lung nodule    TIA (transient ischemic attack)    Past Surgical History:  Past Surgical History:  Procedure Laterality Date   APPENDECTOMY     BACK SURGERY     CHOLECYSTECTOMY     COLONOSCOPY N/A 05/05/2014   Procedure: COLONOSCOPY;  Surgeon: Corbin Ade, MD;  Location: [facility];  Service: Endoscopy;  Laterality: N/A;  100   MULTIPLE TOOTH EXTRACTIONS     HPI:  Patient is a 82 year old male presenting as code stroke with R leg weakness, aphasia, and decreased responsiveness. History of  DVT/PE on eliquis, AAA, TIA, HTN, HLD. MRI of brain reports no acute intercranial pathology.   Assessment / Plan / Recommendation Clinical Impression  Pt seen for cognitive-linguistic evaluation. Assessment completed via informal means. Pt demonstrated grossly intact primary language ability. Pt benefited from extra time and occasional repetition of stimuli which is likely due to hearing status. Pt's speech is fluent, appropriate, and without s/sx dysarthria. x1 instance of paucity of speech/wordfinding with indep repair. Pt A&Ox4 and verbalizes safe solutions to routine problems. Pt and son endorsed pt is  at cognitive-linguistic baseline. Pt may benefit from initial supervision with iADLs and clearance from MD prior to resuming driving. SLP to sign off as pt has no acute SLP needs.    SLP Assessment  SLP Recommendation/Assessment: Patient does not need any further Speech Lanaguage Pathology Services    Recommendations for follow up therapy are one component of a multi-disciplinary discharge planning process, led by the attending physician.  Recommendations may be updated based on patient status, additional functional criteria and insurance authorization.    Follow Up Recommendations  No SLP follow up    Assistance Recommended at Discharge  Intermittent Supervision/Assistance  Functional Status Assessment Patient has not had a recent decline in their functional status        SLP Evaluation Cognition  Overall Cognitive Status: Within Functional Limits for tasks assessed Orientation Level: Oriented X4 Attention:  [facility]) Problem Solving: Appears intact (verbal)       Comprehension  Auditory Comprehension Overall Auditory Comprehension: Appears within functional limits for tasks assessed Yes/No Questions: Within Functional Limits Commands: Within Functional Limits (extra time) Conversation: Complex [facility] with extra time and occasional repetition) Interfering Components: Hearing EffectiveTechniques: Extra processing time;Increased volume    Expression Expression Primary Mode of Expression: Verbal Verbal Expression Overall Verbal Expression: Appears within functional limits for tasks assessed Initiation: No impairment Automatic Speech: Name;Social Response Repetition: No impairment Naming: No impairment (confrontation) Pragmatics: No impairment Written Expression Written Expression: Not tested   Oral / Motor  Motor Speech Overall Motor Speech: Appears  within functional limits for tasks assessed Respiration: Within functional limits Phonation: Normal Resonance: Within functional  limits Articulation: Within functional limitis Intelligibility: Intelligible Motor Planning: Witnin functional limits           Clyde Canterbury, M.S., CCC-SLP Speech-Language Pathologist Secure Chat Preferred  O: [phone]   Woodroe Chen 12/13/2022, 10:44 AM

## 2022-12-13 NOTE — Progress Notes (Signed)
Reviewed discharge paperwork with pt and his son. Pt stated clear understanding of his instructions. All pt's items bagged and pt wheeled off unit by this RN. Will retrieve TOC meds on the way out.

## 2022-12-13 NOTE — Progress Notes (Signed)
Patient returned to room. 

## 2022-12-13 NOTE — Plan of Care (Signed)
  Problem: Pain Managment: Goal: General experience of comfort will improve Outcome: Progressing   Problem: Safety: Goal: Ability to remain free from injury will improve Outcome: Progressing   

## 2022-12-13 NOTE — Care Management Obs Status (Signed)
MEDICARE OBSERVATION STATUS NOTIFICATION   Patient Details  Name: MAHLIK HLADKY MRN: [id number] Date of Birth: [birth year]   Medicare Observation Status Notification Given:  Yes    Lawerance Sabal, RN 12/13/2022, 10:08 AM

## 2022-12-13 NOTE — Progress Notes (Signed)
OT Cancellation Note  Patient Details Name: Andrew Hunt MRN: [id number] DOB: 06-Dec-1940   Cancelled Treatment:    Reason Eval/Treat Not Completed: OT screened, no needs identified, will sign off Did well with PT and back to baseline. No formal OT eval needed at this time.  Lorre Munroe 12/13/2022, 10:23 AM

## 2022-12-13 NOTE — Discharge Summary (Signed)
Name: Andrew Hunt MRN: [id number] DOB: 08/28/1940 82 y.o. PCP: Eartha Inch, MD  Date of Admission: 12/12/2022  9:36 AM Date of Discharge:  12/13/22 Attending Physician: Dr. Cleda Daub  DISCHARGE DIAGNOSIS:  Primary Problem: Acute encephalopathy   Hospital Problems: Principal Problem:   Acute encephalopathy Active Problems:   Bradycardia    DISCHARGE MEDICATIONS:   Allergies as of 12/13/2022       Reactions   Hydrocodone Itching   Oxycodone Itching   Vioxx [rofecoxib] Swelling, Other (See Comments)   Caused mini-strokes        Medication List     STOP taking these medications    magnesium oxide 400 MG tablet Commonly known as: MAG-OX   nebivolol 5 MG tablet Commonly known as: BYSTOLIC       TAKE these medications    acetaminophen 325 MG tablet Commonly known as: TYLENOL Take 2 tablets (650 mg total) by mouth every 6 (six) hours as needed for mild pain, fever or headache (or Fever >/= 101).   amLODipine 10 MG tablet Commonly known as: NORVASC Take 10 mg by mouth daily.   atorvastatin 80 MG tablet Commonly known as: LIPITOR Take 1 tablet (80 mg total) by mouth daily. Start taking on: December 14, 2022 What changed:  medication strength how much to take   Eliquis 2.5 MG Tabs tablet Generic drug: apixaban Take 1 tablet (2.5 mg total) by mouth 2 (two) times daily. What changed:  medication strength how much to take additional instructions   furosemide 40 MG tablet Commonly known as: LASIX Take 1 tablet (40 mg total) by mouth daily.   GLUCOSAMINE 1500 COMPLEX PO Take 1,500 mg by mouth daily.   hydrochlorothiazide 12.5 MG tablet Commonly known as: HYDRODIURIL Take 1 tablet (12.5 mg total) by mouth daily. What changed: how much to take   omeprazole 20 MG capsule Commonly known as: PRILOSEC Take 20 mg by mouth daily.   tamsulosin 0.4 MG Caps capsule Commonly known as: FLOMAX Take 0.4 mg by mouth 2 (two) times daily.    telmisartan 80 MG tablet Commonly known as: MICARDIS Take 1 tablet (80 mg total) by mouth daily.   tiZANidine 4 MG tablet Commonly known as: ZANAFLEX Take 2-4 mg by mouth at bedtime as needed.        DISPOSITION AND FOLLOW-UP:  Andrew Hunt was discharged from [facility] in stable condition. At the hospital follow up visit please address:  Follow-up Recommendations: At [facility], please consider the following - Follow up on initiation of holter monitor which will be mailed to his home - Evaluating bradycarda. We will stop his beta blocker fully in context of encephalopathy, low HR, and possible global hypoperfusion. Understand that his team has worked on this as well with a recent reduction in his beta blocker dose. They will the best ones to evaluate the ongoing role of this medicine in this patient. - Evaluating statin adherence and lipids. LDL is elevated today and he has not taken his statin daily due to muscle aches and diarrhea. Will increase to Lipitor 80mg  per neurology. - Evaluating role of anticoagulation. 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 We will discharge him on the reduced eliquis dose according to his age and creatinine, to be evaluated further as outpatient. - Evaluate diarrhea in this patient taking magnesium  In hospital changes: Changed Lipitor 40 to Lipitor 80 per neurology Adjusted Eliquis to lower  dose per age/creatinine Stopped nebivolol due to bradycardia and encephalopathy Stopped magnesium due to diarrhea   Follow-up Information     Drema Dallas, DO. Schedule an appointment as soon as possible for a visit in 1 month(s).   Specialty: Neurology Contact information: [address]  [address] [place] [address] [phone]                 HOSPITAL COURSE:  Patient Summary: Andrew Hunt is an 82 yo M with PMH DVT/PE on eliquis, AAA, TIA, HTN, HLD who  presented to the [facility] ED on the morning of 9/3 as a code stroke with R hemiparesis, aphasia, and decreased responsiveness. He was felt to be normal early in the morning. Thereafter, his daughter noticed slurred speech and he complained of dizziness. Over the next 15 minutes, this progressed to aphasia and generalized weakness upon EMS arrival and he was not following commands. He was often bradycardic as low as 45. Stroke workup was negative. Furthermore, no structural abnormalities, metabolic derangements, signs of infection, or toxic insults were identified. The patient remained unresponsive for several hours in the ED but gradually his status improved. At time of admission, he was communicating and responding to commands with some lingering confusion and dysarthria. Echocardiography was completed. The patient returned to his full baseline on the morning after admission. He does not remember the events of yesterday morning. He communicated clearly, ambulated, and was able to manage ADLs. With full resolution of symptoms and no ongoing issues requiring hospital treatment, Andrew Hunt was ready for discharge. After a full workup, we are attributing his encephalopathy to an episode of global hypoperfusion vs TIA vs seizure. He has a history of TIAs and that may be in play here, though his symptoms were present for many hours, and he also has underlying bradycardia and recent stress related to his wife's chronic illness. Risk factor modification addressed with statin adherence, smoking discussion. He will be sent home with preparations made for a holter monitor that will be mailed to him. It was noted that he has not been taking his statin regularly and today's lipid panel reflects that there is room for improvement with regular adherence. His family believes this episode resembles previous episodes that they describe as mini strokes. In the past, they noted his confusion related to his medications, such as arthritis  pain medicines, that have since stopped. It is unclear if he still needs to take his Eliquis that was started due to DVT/PE during Covid illness in 2020. We will discharge him on the reduced dose of 2.5mg  BID according to his age and creatinine with recommendations for further evaluation by his PCP. We have asked him to make appointments with his cardiologist, neurologist, and PCP.  Acute Encephalopathy Pt presented with R-sided weakness, aphasia, and not following commands of sudden onset in context of two days of fatigue and dizziness. This improved over the course of an afternoon in the ED and at time of admission he was alert though remained disorented to year and had lingering dysarthria but could communicate. Stroke negative per imaging and further there were no structural findings that would explain his illness. No infection per UA, CXR, cranial imaging and he did not have leukocytosis. Metabolic panel unremarkable for acute electrolytes/glucose. Toxicology negative. He manages his own medications but I reviewed his pill carrier and it was well organized so I did not suspect a medication accident. EEG in ED not correlated to seizure although cannot definitively  rule out epilepsy. Worth noting this patient reports a history of TIAs and the neurologic insult resembles a TIA, but the duration of symptoms which lasted for many hours makes that diagnosis less favorable. CCM did not reveal issue beyond bradycardia. Mentation fully improved on morning of discharge. Leading theory at discharge is global hypoperfusion vs TIA vs seizure leading to encephalopathy.  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 This is chronic - review of office notes from the summer show HR as low as 40 and it appears that his team's goal has been managing the patients HTN/CHF (primary symptom LE edema) in the context of bradycardia, such as recent reduction in the beta blocker dose. ECHO in May of this year  revealed EF 60-65% with mild LA dilation and mild LV hypertrophy. Repeat today. During this admission he was always euvolemic on exam, no LE edema, no dyspnea, extremities are warm, mentation is improving, so not concerned for exacerbation or poor perfusion lasting beyond primary insult. Home antihypertensives held at admission but will restart at discharge, will however hold his beta blocker which can be restarted if appropriate according to evaluation by his ambulatory caregivers.   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 Pt remains on Eliquis at this time. He is prescribed 5mg  BID but appears to take 5mg  at bedtime. Unclear if provoked vs unprovoked and if he needs to be on Eliquis now. Will evaluate as patient's mentation improves.   HLD - continue home atorvastatin 40mg  GERD - continue home pantoprazole 40mg  BPH - hold home tamsulosin for now to maintain orthostatics as mentation improves Chronic pain - Tylenol prn - hold home tizanidine for now per mentation   DISCHARGE INSTRUCTIONS:   Discharge Instructions     (HEART FAILURE PATIENTS) Call MD:  Anytime you have any of the following symptoms: 1) 3 pound weight gain in 24 hours or 5 pounds in 1 week 2) shortness of breath, with or without a dry hacking cough 3) swelling in the hands, feet or stomach 4) if you have to sleep on extra pillows at night in order to breathe.   Complete by: As directed    Ambulatory referral to Neurology   Complete by: As directed    An appointment is requested in approximately: 2-4 weeks, prior Dr. Everlena Cooper patient, unexplained neurologic event and hospitalization follow   Call MD for:  difficulty breathing, headache or visual disturbances   Complete by: As directed    Call MD for:  extreme fatigue   Complete by: As directed    Call MD for:  persistant dizziness or light-headedness   Complete by: As directed    Call MD for:  persistant nausea and vomiting   Complete by:  As directed    Call MD for:  redness, tenderness, or signs of infection (pain, swelling, redness, odor or green/yellow discharge around incision site)   Complete by: As directed    Call MD for:  severe uncontrolled pain   Complete by: As directed    Call MD for:  temperature >100.4   Complete by: As directed    Diet - low sodium heart healthy   Complete by: As directed    Increase activity slowly   Complete by: As directed        SUBJECTIVE:  Pt feels very well. He has no concern, pain, confusion, weakness. Full return of strength. He is ready to return home. Discharge Vitals:  BP (!) 159/68 (BP Location: Right Arm)   Pulse (!) 55   Temp 97.6 F (36.4 C) (Oral)   Resp 18   Ht 5\' 6"  (1.676 m)   Wt 76.9 kg   SpO2 96%   BMI 27.36 kg/m   OBJECTIVE:  Physical Exam Constitutional:      Appearance: Normal appearance. He is not ill-appearing.  HENT:     Head: Normocephalic and atraumatic.  Eyes:     Pupils: Pupils are equal, round, and reactive to light.  Cardiovascular:     Rate and Rhythm: Normal rate and regular rhythm.     Pulses: Normal pulses.     Heart sounds: Normal heart sounds.  Pulmonary:     Effort: Pulmonary effort is normal. No respiratory distress.     Breath sounds: Normal breath sounds.  Abdominal:     General: Abdomen is flat.     Palpations: Abdomen is soft.     Tenderness: There is no abdominal tenderness.  Musculoskeletal:     Right lower leg: No edema.     Left lower leg: No edema.  Skin:    General: Skin is warm and dry.  Neurological:     General: No focal deficit present.     Mental Status: He is alert and oriented to person, place, and time. Mental status is at baseline.  Psychiatric:        Mood and Affect: Mood normal.        Behavior: Behavior normal.      Pertinent Labs, Studies, and Procedures:     Latest Ref Rng & Units 12/12/2022   10:41 AM 12/12/2022    9:43 AM 06/20/2022    1:14 PM  CBC  WBC 4.0 - 10.5 K/uL  8.9  8.8    Hemoglobin 13.0 - 17.0 g/dL 84.1  32.4    40.1  02.7   Hematocrit 39.0 - 52.0 % 42.0  43.1    44.0  34.0   Platelets 150 - 400 K/uL  180  174        Latest Ref Rng & Units 12/13/2022    7:19 AM 12/12/2022   10:41 AM 12/12/2022    9:43 AM  CMP  Glucose 70 - 99 mg/dL 253   664    403   BUN 8 - 23 mg/dL 27   22    28    Creatinine 0.61 - 1.24 mg/dL 4.74   2.59    5.63   Sodium 135 - 145 mmol/L 137  134  132    135   Potassium 3.5 - 5.1 mmol/L 3.5  3.8  4.2    4.3   Chloride 98 - 111 mmol/L 102   98    101   CO2 22 - 32 mmol/L 25   22   Calcium 8.9 - 10.3 mg/dL 8.9   9.6   Total Protein 6.5 - 8.1 g/dL   7.7   Total Bilirubin 0.3 - 1.2 mg/dL   0.9   Alkaline Phos 38 - 126 U/L   59   AST 15 - 41 U/L   19   ALT 0 - 44 U/L   17     EEG adult  Result Date: 12/12/2022 Charlsie Quest, MD     12/12/2022 12:45 PM Patient Name: TRIGGER KOSKIE MRN: [id number] Epilepsy Attending: Charlsie Quest Referring Physician/Provider: Elmer Picker, NP Date: 12/12/2022 Duration: 24.28 mins Patient history: 82 y.o. male with a past medical history of  DVT on eliquis, AAA, arthritis, GERD, HTN, HLD, and TIA presenting with aphasia and right sided weakness. EEG to evaluate for seizure Level of alertness: Awake, asleep AEDs during EEG study: None Technical aspects: This EEG study was done with scalp electrodes positioned according to the 10-20 International system of electrode placement. Electrical activity was reviewed with band pass filter of 1-70Hz , sensitivity of 7 uV/mm, display speed of 38mm/sec with a 60Hz  notched filter applied as appropriate. EEG data were recorded continuously and digitally stored.  Video monitoring was available and reviewed as appropriate. Description: The posterior dominant rhythm consists of 8 Hz activity of moderate voltage (25-35 uV) seen predominantly in posterior head regions, symmetric and reactive to eye opening and eye closing. Sleep was characterized by vertex waves, sleep  spindles (12 to 14 Hz), maximal frontocentral region. Hyperventilation and photic stimulation were not performed.   IMPRESSION: This study is within normal limits. No seizures or epileptiform discharges were seen throughout the recording. A normal interictal EEG does not exclude the diagnosis of epilepsy. Charlsie Quest   DG Chest Portable 1 View  Result Date: 12/12/2022 CLINICAL DATA:  Altered mental status. EXAM: PORTABLE CHEST 1 VIEW COMPARISON:  06/20/2022 FINDINGS: Stable mild-to-moderate cardiomegaly. Both lungs are clear. No evidence of pleural effusion. IMPRESSION: Cardiomegaly. No active lung disease. Electronically Signed   By: Danae Orleans M.D.   On: 12/12/2022 11:29   Andrew BRAIN WO CONTRAST  Result Date: 12/12/2022 CLINICAL DATA:  Right-sided weakness and aphasia. EXAM: MRI HEAD WITHOUT CONTRAST TECHNIQUE: Multiplanar, multiecho pulse sequences of the brain and surrounding structures were obtained without intravenous contrast. COMPARISON:  Same-day head CT FINDINGS: A truncated protocol was performed. Axial and coronal DWI, axial FLAIR, and axial T2 images were obtained. The provided sequences are moderately motion degraded. Brain: There is no evidence of acute intracranial hemorrhage, extra-axial fluid collection, or acute infarct. Parenchymal volume is within expected limits. The ventricles are normal in size. Patchy FLAIR signal abnormality in the supratentorial white matter and pons likely reflects sequela of underlying chronic small-vessel ischemic change. Small remote infarcts are seen in the bilateral corona radiata, basal ganglia, and thalami. There is no solid mass lesion there is no mass effect or midline shift. Vascular: Normal flow voids. Skull and upper cervical spine: Suboptimally assessed on the provided sequences. Sinuses/Orbits: The paranasal sinuses are clear. The globes and orbits are unremarkable. Other: The mastoid air cells and middle ear cavities are clear. IMPRESSION: No  acute intracranial pathology. Electronically Signed   By: Lesia Hausen M.D.   On: 12/12/2022 10:31   CT ANGIO HEAD NECK W WO CM (CODE STROKE)  Result Date: 12/12/2022 CLINICAL DATA:  Code stroke EXAM: CT ANGIOGRAPHY HEAD AND NECK WITH AND WITHOUT CONTRAST TECHNIQUE: Multidetector CT imaging of the head and neck was performed using the standard protocol during bolus administration of intravenous contrast. Multiplanar CT image reconstructions and MIPs were obtained to evaluate the vascular anatomy. Carotid stenosis measurements (when applicable) are obtained utilizing NASCET criteria, using the distal internal carotid diameter as the denominator. RADIATION DOSE REDUCTION: This exam was performed according to the departmental dose-optimization program which includes automated exposure control, adjustment of the mA and/or kV according to patient size and/or use of iterative reconstruction technique. CONTRAST:  75mL OMNIPAQUE IOHEXOL 350 MG/ML SOLN COMPARISON:  Same-day CT head, carotid Doppler 07/17/2019 FINDINGS: CTA NECK FINDINGS Aortic arch: There is mild calcified plaque in the imaged aortic arch. The origins of the major branch vessels are patent. The subclavian  arteries are patent to the level imaged. Right carotid system: The right common, internal, and external carotid arteries are patent, with mild plaque at the bifurcation but no hemodynamically significant stenosis or occlusion. There is no evidence of dissection or aneurysm. There is a medialized course of the right common and internal carotid arteries. Left carotid system: The left common, internal, and external carotid arteries are patent, with mild plaque of the bifurcation but no hemodynamically significant stenosis or occlusion. There is no evidence of dissection or aneurysm. There is a medialized course of the common and internal carotid arteries. Vertebral arteries: The vertebral arteries are patent, without hemodynamically significant stenosis or  occlusion. There is no evidence of dissection or aneurysm. Skeleton: There is no acute osseous abnormality or suspicious osseous lesion. There is no visible canal hematoma. Other neck: There are soft tissue nodules in both parotid glands measuring up 2 1.1 cm x 1.4 cm inferiorly on the right, 1.2 cm x 0.9 cm more superiorly on the right, and 1.9 cm x 1.4 cm on the left. 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 Upper chest: A calcified granuloma is noted in the right upper lobe. Review of the MIP images confirms the above findings CTA HEAD FINDINGS Anterior circulation: There is calcified plaque in the intracranial ICAs resulting in mild-to-moderate stenosis bilaterally. Note that the petrous segments are suboptimally evaluated due to streak artifact and bolus timing. The bilateral MCAs are patent, without proximal stenosis or occlusion. 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 There is short-segment moderate stenosis of the left A2 segment (7-66, 8-144). The ACAS are otherwise patent, without other proximal stenosis or occlusion. The anterior communicating artery is normal. There is no aneurysm or AVM. Posterior circulation: The bilateral V4 segments are patent with mild plaque but no significant stenosis or occlusion. The basilar artery is patent. The major cerebellar arteries appear patent. The bilateral PCAs are patent, without proximal stenosis or occlusion. Posterior communicating arteries are not identified. There is no aneurysm or AVM. Venous sinuses: Not well evaluated due to bolus timing. Anatomic variants: As above. Review of the MIP images confirms the above findings IMPRESSION: 1. No emergent vascular finding. 2. Mild plaque at both carotid bifurcations without significant stenosis or occlusion. 3. 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 4. 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 Findings communicated to Dr Wilford Corner at 10:08 am Electronically Signed   By: Lesia Hausen M.D.   On: 12/12/2022 10:12   CT HEAD CODE STROKE WO CONTRAST  Result Date: 12/12/2022 CLINICAL DATA:  Code stroke.  Neuro deficit EXAM: CT HEAD WITHOUT CONTRAST TECHNIQUE: Contiguous axial images were obtained from the base of the skull through the vertex without intravenous contrast. RADIATION DOSE REDUCTION: This exam was performed according to the departmental dose-optimization program which includes automated exposure control, adjustment of the mA and/or kV according to patient size and/or use of iterative reconstruction technique. COMPARISON:  CT Head 12/18/19 FINDINGS: Limitations: Motion degraded exam Brain: No hemorrhage. No hydrocephalus. No extra-axial fluid collection. Sequela of severe chronic microvascular ischemic change. 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 Vascular: Apparent asymmetrically hyperdense appearance of the left MCA is likely artifactual. Skull: Normal. Negative for fracture or focal lesion. Sinuses/Orbits: 2 no middle ear or mastoid effusion. Paranasal sinuses are clear. Orbits are unremarkable. Other: None. ASPECTS (  [place] Stroke Program Early CT Score): 10 when accounting for chronic findings. IMPRESSION: Motion degraded exam. Within this limitation, no hemorrhage or CT evidence of an acute cortical infarct. Aspects is 10 when accounting for chronic findings Findings were paged to Dr. Wilford Corner on 12/12/2022 at 9:49 a.m via [facility] paging system. Electronically Signed   By: Lorenza Cambridge M.D.   On: 12/12/2022 09:52    Signed: Katheran James, DO Internal Medicine Resident, PGY-1 [facility]  Pager: [phone] 3:13 PM, 12/13/2022

## 2022-12-13 NOTE — Progress Notes (Signed)
STROKE TEAM PROGRESS NOTE   SUBJECTIVE (INTERVAL HISTORY) His daughter is at the bedside.  Overall his condition is completely resolved. Pt stated that yesterday he woke up 5-6 am was normal but around 7-8 am when he tried to cook pasta he felt weak on walking, leaning toward to one side, wife got him sitting down. Daughter was called and came by found pt slurry speech, garbled words, not able to understand. He was sent to ED. Pt reported that he did not remember what happened after he sat down. He was still very drowsy sleepy in the ED, after in the afternoon he woke up around 2pm, he still had some slurry but over time, speech became normalized, which is around 3pm.   He stated that 2 days ago, he had episode of not walking straight but only lasted about 1-2 min and resolved. He stated that he had "mini stroke" in the past with vertigo. He takes eliquis for DVT, although he said he is taking bid dosing but family checked his pill box, seems only take eliquis at night.    OBJECTIVE Temp:  [97.6 F (36.4 C)-98.7 F (37.1 C)] 97.6 F (36.4 C) (09/04 1143) Pulse Rate:  [47-55] 55 (09/04 1143) Cardiac Rhythm: Heart block (09/04 0708) Resp:  [16-20] 18 (09/04 1143) BP: (128-159)/(49-78) 159/68 (09/04 1143) SpO2:  [93 %-97 %] 96 % (09/04 0713) Weight:  [76.9 kg] 76.9 kg (09/04 0500)  Recent Labs  Lab 12/12/22 0937 12/13/22 0629  GLUCAP 119* 138*   Recent Labs  Lab 12/12/22 0943 12/12/22 1041 12/13/22 0719  NA 132*  135 134* 137  K 4.2  4.3 3.8 3.5  CL 98  101  --  102  CO2 22  --  25  GLUCOSE 122*  121*  --  128*  BUN 22  28*  --  27*  CREATININE 1.29*  1.30*  --  1.61*  CALCIUM 9.6  --  8.9   Recent Labs  Lab 12/12/22 0943  AST 19  ALT 17  ALKPHOS 59  BILITOT 0.9  PROT 7.7  ALBUMIN 3.8   Recent Labs  Lab 12/12/22 0943 12/12/22 1041  WBC 8.9  --   NEUTROABS 6.0  --   HGB 14.6  15.0 14.3  HCT 43.1  44.0 42.0  MCV 92.5  --   PLT 180  --    No results for  input(s): "CKTOTAL", "CKMB", "CKMBINDEX", "TROPONINI" in the last 168 hours. Recent Labs    12/12/22 0943  LABPROT 13.6  INR 1.0   Recent Labs    12/12/22 1053  COLORURINE YELLOW  LABSPEC 1.014  PHURINE 6.0  GLUCOSEU NEGATIVE  HGBUR LARGE*  BILIRUBINUR NEGATIVE  KETONESUR NEGATIVE  PROTEINUR NEGATIVE  NITRITE NEGATIVE  LEUKOCYTESUR NEGATIVE       Component Value Date/Time   CHOL 209 (H) 12/13/2022 0748   CHOL 143 07/25/2022 0952   TRIG 221 (H) 12/13/2022 0748   HDL 26 (L) 12/13/2022 0748   HDL 32 (L) 07/25/2022 0952   CHOLHDL 8.0 12/13/2022 0748   VLDL 44 (H) 12/13/2022 0748   LDLCALC 139 (H) 12/13/2022 0748   LDLCALC 90 07/25/2022 0952   Lab Results  Component Value Date   HGBA1C 6.3 (H) 12/13/2022      Component Value Date/Time   LABOPIA NONE DETECTED 12/12/2022 1028   COCAINSCRNUR NONE DETECTED 12/12/2022 1028   LABBENZ NONE DETECTED 12/12/2022 1028   AMPHETMU NONE DETECTED 12/12/2022 1028   THCU NONE DETECTED 12/12/2022 1028  LABBARB NONE DETECTED 12/12/2022 1028    Recent Labs  Lab 12/12/22 0943  ETH <10    I have personally reviewed the radiological images below and agree with the radiology interpretations.  ECHOCARDIOGRAM COMPLETE  Result Date: 12/13/2022    ECHOCARDIOGRAM REPORT   Patient Name:   Andrew Hunt Date of Exam: 12/13/2022 Medical Rec #:  [id number]        Height:       66.0 in Accession #:    [id number]       Weight:       169.5 lb Date of Birth:  03/09/1941        BSA:          1.864 m Patient Age:    82 years         BP:           159/68 mmHg Patient Gender: M                HR:           56 bpm. Exam Location:  Inpatient Procedure: 2D Echo, Cardiac Doppler and Color Doppler Indications:    Stoke  History:        Patient has prior history of Echocardiogram examinations, most                 recent 08/24/2022. Risk Factors:Hypertension and Dyslipidemia.  Sonographer:    Melton Krebs RDCS, FE, PE Referring Phys: [id number] ASHISH ARORA  IMPRESSIONS  1. Left ventricular ejection fraction, by estimation, is 60 to 65%. The left ventricle has normal function. The left ventricle has no regional wall motion abnormalities. There is mild concentric left ventricular hypertrophy. Left ventricular diastolic parameters are consistent with Grade I diastolic dysfunction (impaired relaxation).  2. Right ventricular systolic function is normal. The right ventricular size is normal. There is normal pulmonary artery systolic pressure.  3. The mitral valve is normal in structure. No evidence of mitral valve regurgitation.  4. The aortic valve is tricuspid. There is mild calcification of the aortic valve. Aortic valve regurgitation is not visualized.  5. The inferior vena cava is normal in size with greater than 50% respiratory variability, suggesting right atrial pressure of 3 mmHg. FINDINGS  Left Ventricle: Left ventricular ejection fraction, by estimation, is 60 to 65%. The left ventricle has normal function. The left ventricle has no regional wall motion abnormalities. The left ventricular internal cavity size was normal in size. There is  mild concentric left ventricular hypertrophy. Left ventricular diastolic parameters are consistent with Grade I diastolic dysfunction (impaired relaxation). Right Ventricle: The right ventricular size is normal. Right ventricular systolic function is normal. There is normal pulmonary artery systolic pressure. The tricuspid regurgitant velocity is 2.30 m/s, and with an assumed right atrial pressure of 3 mmHg,  the estimated right ventricular systolic pressure is 24.2 mmHg. Left Atrium: Left atrial size was normal in size. Right Atrium: Right atrial size was normal in size. Pericardium: There is no evidence of pericardial effusion. Mitral Valve: The mitral valve is normal in structure. No evidence of mitral valve regurgitation. Tricuspid Valve: Tricuspid valve regurgitation is mild. Aortic Valve: The aortic valve is tricuspid.  There is mild calcification of the aortic valve. Aortic valve regurgitation is not visualized. Aortic valve mean gradient measures 6.0 mmHg. Aortic valve peak gradient measures 12.1 mmHg. Aortic valve area, by  VTI measures 2.02 cm. Pulmonic Valve: Pulmonic valve regurgitation is not visualized. Aorta: The aortic root and ascending aorta  are structurally normal, with no evidence of dilitation. Venous: The inferior vena cava is normal in size with greater than 50% respiratory variability, suggesting right atrial pressure of 3 mmHg. IAS/Shunts: No atrial level shunt detected by color flow Doppler.  LEFT VENTRICLE PLAX 2D LVIDd:         4.90 cm   Diastology LVIDs:         3.20 cm   LV e' medial:    5.22 cm/s LV PW:         1.10 cm   LV E/e' medial:  14.5 LV IVS:        1.10 cm   LV e' lateral:   4.03 cm/s LVOT diam:     2.00 cm   LV E/e' lateral: 18.7 LV SV:         78 LV SV Index:   42 LVOT Area:     3.14 cm  RIGHT VENTRICLE TAPSE (M-mode): 1.7 cm LEFT ATRIUM             Index        RIGHT ATRIUM           Index LA diam:        4.30 cm 2.31 cm/m   RA Area:     14.50 cm LA Vol (A2C):   31.1 ml 16.68 ml/m  RA Volume:   30.30 ml  16.25 ml/m LA Vol (A4C):   42.9 ml 23.01 ml/m LA Biplane Vol: 39.9 ml 21.40 ml/m  AORTIC VALVE AV Area (Vmax):    2.04 cm AV Area (Vmean):   1.97 cm AV Area (VTI):     2.02 cm AV Vmax:           174.00 cm/s AV Vmean:          113.000 cm/s AV VTI:            0.386 m AV Peak Grad:      12.1 mmHg AV Mean Grad:      6.0 mmHg LVOT Vmax:         113.00 cm/s LVOT Vmean:        70.700 cm/s LVOT VTI:          0.248 m LVOT/AV VTI ratio: 0.64  AORTA Ao Root diam: 3.20 cm MITRAL VALVE               TRICUSPID VALVE MV Area (PHT): 2.76 cm    TR Peak grad:   21.2 mmHg MV Decel Time: 275 msec    TR Vmax:        230.00 cm/s MV E velocity: 75.50 cm/s MV A velocity: 86.50 cm/s  SHUNTS MV E/A ratio:  0.87        Systemic VTI:  0.25 m                            Systemic Diam: 2.00 cm Carolan Clines  Electronically signed by Carolan Clines Signature Date/Time: 12/13/2022/2:36:48 PM    Final    EEG adult  Result Date: 12/12/2022 Charlsie Quest, MD     12/12/2022 12:45 PM Patient Name: Andrew Hunt MRN: [id number] Epilepsy Attending: Charlsie Quest Referring Physician/Provider: Elmer Picker, NP Date: 12/12/2022 Duration: 24.28 mins Patient history: 82 y.o. male with a past medical history of DVT on eliquis, AAA, arthritis, GERD, HTN, HLD, and TIA presenting with aphasia and right sided weakness. EEG to evaluate for seizure Level of alertness:  Awake, asleep AEDs during EEG study: None Technical aspects: This EEG study was done with scalp electrodes positioned according to the 10-20 International system of electrode placement. Electrical activity was reviewed with band pass filter of 1-70Hz , sensitivity of 7 uV/mm, display speed of 68mm/sec with a 60Hz  notched filter applied as appropriate. EEG data were recorded continuously and digitally stored.  Video monitoring was available and reviewed as appropriate. Description: The posterior dominant rhythm consists of 8 Hz activity of moderate voltage (25-35 uV) seen predominantly in posterior head regions, symmetric and reactive to eye opening and eye closing. Sleep was characterized by vertex waves, sleep spindles (12 to 14 Hz), maximal frontocentral region. Hyperventilation and photic stimulation were not performed.   IMPRESSION: This study is within normal limits. No seizures or epileptiform discharges were seen throughout the recording. A normal interictal EEG does not exclude the diagnosis of epilepsy. Charlsie Quest   DG Chest Portable 1 View  Result Date: 12/12/2022 CLINICAL DATA:  Altered mental status. EXAM: PORTABLE CHEST 1 VIEW COMPARISON:  06/20/2022 FINDINGS: Stable mild-to-moderate cardiomegaly. Both lungs are clear. No evidence of pleural effusion. IMPRESSION: Cardiomegaly. No active lung disease. Electronically Signed   By: Danae Orleans M.D.   On:  12/12/2022 11:29   MR BRAIN WO CONTRAST  Result Date: 12/12/2022 CLINICAL DATA:  Right-sided weakness and aphasia. EXAM: MRI HEAD WITHOUT CONTRAST TECHNIQUE: Multiplanar, multiecho pulse sequences of the brain and surrounding structures were obtained without intravenous contrast. COMPARISON:  Same-day head CT FINDINGS: A truncated protocol was performed. Axial and coronal DWI, axial FLAIR, and axial T2 images were obtained. The provided sequences are moderately motion degraded. Brain: There is no evidence of acute intracranial hemorrhage, extra-axial fluid collection, or acute infarct. Parenchymal volume is within expected limits. The ventricles are normal in size. Patchy FLAIR signal abnormality in the supratentorial white matter and pons likely reflects sequela of underlying chronic small-vessel ischemic change. Small remote infarcts are seen in the bilateral corona radiata, basal ganglia, and thalami. There is no solid mass lesion there is no mass effect or midline shift. Vascular: Normal flow voids. Skull and upper cervical spine: Suboptimally assessed on the provided sequences. Sinuses/Orbits: The paranasal sinuses are clear. The globes and orbits are unremarkable. Other: The mastoid air cells and middle ear cavities are clear. IMPRESSION: No acute intracranial pathology. Electronically Signed   By: Lesia Hausen M.D.   On: 12/12/2022 10:31   CT ANGIO HEAD NECK W WO CM (CODE STROKE)  Result Date: 12/12/2022 CLINICAL DATA:  Code stroke EXAM: CT ANGIOGRAPHY HEAD AND NECK WITH AND WITHOUT CONTRAST TECHNIQUE: Multidetector CT imaging of the head and neck was performed using the standard protocol during bolus administration of intravenous contrast. Multiplanar CT image reconstructions and MIPs were obtained to evaluate the vascular anatomy. Carotid stenosis measurements (when applicable) are obtained utilizing NASCET criteria, using the distal internal carotid diameter as the denominator. RADIATION DOSE  REDUCTION: This exam was performed according to the departmental dose-optimization program which includes automated exposure control, adjustment of the mA and/or kV according to patient size and/or use of iterative reconstruction technique. CONTRAST:  75mL OMNIPAQUE IOHEXOL 350 MG/ML SOLN COMPARISON:  Same-day CT head, carotid Doppler 07/17/2019 FINDINGS: CTA NECK FINDINGS Aortic arch: There is mild calcified plaque in the imaged aortic arch. The origins of the major branch vessels are patent. The subclavian arteries are patent to the level imaged. Right carotid system: The right common, internal, and external carotid arteries are patent, with mild plaque at the  bifurcation but no hemodynamically significant stenosis or occlusion. There is no evidence of dissection or aneurysm. There is a medialized course of the right common and internal carotid arteries. Left carotid system: The left common, internal, and external carotid arteries are patent, with mild plaque of the bifurcation but no hemodynamically significant stenosis or occlusion. There is no evidence of dissection or aneurysm. There is a medialized course of the common and internal carotid arteries. Vertebral arteries: The vertebral arteries are patent, without hemodynamically significant stenosis or occlusion. There is no evidence of dissection or aneurysm. Skeleton: There is no acute osseous abnormality or suspicious osseous lesion. There is no visible canal hematoma. Other neck: There are soft tissue nodules in both parotid glands measuring up 2 1.1 cm x 1.4 cm inferiorly on the right, 1.2 cm x 0.9 cm more superiorly on the right, and 1.9 cm x 1.4 cm on the left. 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 Upper chest: A calcified granuloma is noted in the right upper lobe. Review of the MIP images confirms the above findings CTA HEAD FINDINGS Anterior circulation: There is calcified plaque  in the intracranial ICAs resulting in mild-to-moderate stenosis bilaterally. Note that the petrous segments are suboptimally evaluated due to streak artifact and bolus timing. The bilateral MCAs are patent, without proximal stenosis or occlusion. 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 There is short-segment moderate stenosis of the left A2 segment (7-66, 8-144). The ACAS are otherwise patent, without other proximal stenosis or occlusion. The anterior communicating artery is normal. There is no aneurysm or AVM. Posterior circulation: The bilateral V4 segments are patent with mild plaque but no significant stenosis or occlusion. The basilar artery is patent. The major cerebellar arteries appear patent. The bilateral PCAs are patent, without proximal stenosis or occlusion. Posterior communicating arteries are not identified. There is no aneurysm or AVM. Venous sinuses: Not well evaluated due to bolus timing. Anatomic variants: As above. Review of the MIP images confirms the above findings IMPRESSION: 1. No emergent vascular finding. 2. Mild plaque at both carotid bifurcations without significant stenosis or occlusion. 3. 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 4. 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 Findings communicated to Dr Wilford Corner at 10:08 am Electronically Signed   By: Lesia Hausen M.D.   On: 12/12/2022 10:12   CT HEAD CODE STROKE WO CONTRAST  Result Date: 12/12/2022 CLINICAL DATA:  Code stroke.  Neuro deficit EXAM: CT HEAD WITHOUT CONTRAST TECHNIQUE: Contiguous axial images were obtained from the base of the skull through the vertex without intravenous contrast. RADIATION DOSE REDUCTION: This exam was performed according to the departmental dose-optimization program which includes automated exposure control, adjustment of the mA and/or kV according to patient  size and/or use of iterative reconstruction technique. COMPARISON:  CT Head 12/18/19 FINDINGS: Limitations: Motion degraded exam Brain: No hemorrhage. No hydrocephalus. No extra-axial fluid collection. Sequela of severe chronic microvascular ischemic change. 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 Vascular: Apparent asymmetrically hyperdense appearance of the left MCA is likely artifactual. Skull: Normal. Negative for fracture or focal lesion. Sinuses/Orbits: 2 no middle ear or mastoid effusion. Paranasal sinuses are clear. Orbits are unremarkable. Other: None. ASPECTS [facility] Stroke Program Early CT Score): 10 when accounting for chronic findings. IMPRESSION: Motion degraded exam. Within this limitation, no hemorrhage or CT evidence of  an acute cortical infarct. Aspects is 10 when accounting for chronic findings Findings were paged to Dr. Wilford Corner on 12/12/2022 at 9:49 a.m via [facility] paging system. Electronically Signed   By: Lorenza Cambridge M.D.   On: 12/12/2022 09:52     PHYSICAL EXAM  Temp:  [97.6 F (36.4 C)-98.7 F (37.1 C)] 97.6 F (36.4 C) (09/04 1143) Pulse Rate:  [47-55] 55 (09/04 1143) Resp:  [16-20] 18 (09/04 1143) BP: (128-159)/(49-78) 159/68 (09/04 1143) SpO2:  [93 %-97 %] 96 % (09/04 0713) Weight:  [76.9 kg] 76.9 kg (09/04 0500)  General - Well nourished, well developed, in no apparent distress.  Ophthalmologic - fundi not visualized due to noncooperation.  Cardiovascular - Regular rhythm and rate.  Mental Status -  Level of arousal and orientation to time, place, and person were intact. Language including expression, naming, repetition, comprehension was assessed and found intact. Fund of Knowledge was assessed and was intact.  Cranial Nerves II - XII - II - Visual field intact OU. III, IV, VI - Extraocular movements intact. V - Facial sensation intact bilaterally. VII - Facial movement intact bilaterally. VIII - Hearing & vestibular intact  bilaterally. X - Palate elevates symmetrically. XI - Chin turning & shoulder shrug intact bilaterally. XII - Tongue protrusion intact.  Motor Strength - The patient's strength was normal in all extremities and pronator drift was absent.  Bulk was normal and fasciculations were absent.   Motor Tone - Muscle tone was assessed at the neck and appendages and was normal.  Reflexes - The patient's reflexes were symmetrical in all extremities and he had no pathological reflexes.  Sensory - Light touch, temperature/pinprick were assessed and were symmetrical.    Coordination - The patient had normal movements in the hands and feet with no ataxia or dysmetria.  Tremor was absent.  Gait and Station - deferred.   ASSESSMENT/PLAN Andrew Hunt is a 82 y.o. male with history of DVT on eliquis, HTN, HLD, "mini" stroke admitted for slurry speech, aphasia, right sided weakness. No tPA given due to outside window.    TIA vs. Seizure  Atypical for TIA as symptoms lasted 6-8 hours and MRI negative. But pt does have risk factor for stroke Atypical for seizure as no shaking jerking and EEG neg. But pt did have non-recollection and post-ictal phenomena. CT no acute finding CTA head and neck - b/l siphon mild to mod stenosis, left A2 mod stenosis MRI  no acute infarct EEG normal range 2D Echo  EF 60-65% LDL 139 HgbA1c 6.3 SCDs for VTE prophylaxis Eliquis (apixaban) daily (pt said he is taking bid dosing but family checked his pill box, seems only take eliquis at night) prior to admission, now on Eliquis (apixaban) 5mg  bid dosing.  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 Please call with questions. Pt will follow up with Dr. Everlena Cooper at [facility] in about 4 weeks. Thanks for the consult.   Marvel Plan, MD PhD Stroke Neurology 12/13/2022 2:53 PM  To contact Stroke Continuity provider, please refer to [email or web address]. After hours, contact General Neurology

## 2022-12-15 ENCOUNTER — Encounter: Payer: Self-pay | Admitting: Neurology

## 2022-12-21 ENCOUNTER — Ambulatory Visit: Payer: Medicare Other | Attending: Family Medicine

## 2022-12-22 LAB — BASIC METABOLIC PANEL
BUN/Creatinine Ratio: 20 (ref 10–24)
BUN: 30 mg/dL — ABNORMAL HIGH (ref 8–27)
CO2: 23 mmol/L (ref 20–29)
Calcium: 9 mg/dL (ref 8.6–10.2)
Chloride: 101 mmol/L (ref 96–106)
Creatinine, Ser: 1.5 mg/dL — ABNORMAL HIGH (ref 0.76–1.27)
Glucose: 103 mg/dL — ABNORMAL HIGH (ref 70–99)
Potassium: 3.9 mmol/L (ref 3.5–5.2)
Sodium: 134 mmol/L (ref 134–144)
eGFR: 46 mL/min/{1.73_m2} — ABNORMAL LOW (ref >59)

## 2022-12-22 LAB — MAGNESIUM: Magnesium: 1.3 mg/dL — ABNORMAL LOW (ref 1.6–2.3)

## 2022-12-25 ENCOUNTER — Telehealth: Payer: Self-pay | Admitting: Pharmacist

## 2022-12-25 MED ORDER — MAGNESIUM 400 MG PO TABS: 400.0000 mg | ORAL_TABLET | Freq: Every day | ORAL | Status: AC

## 2022-12-25 MED ORDER — HYDROCHLOROTHIAZIDE 12.5 MG PO TABS: 12.5000 mg | ORAL_TABLET | Freq: Every day | ORAL | Status: DC

## 2022-12-25 MED ORDER — FUROSEMIDE 40 MG PO TABS: 40.0000 mg | ORAL_TABLET | Freq: Every day | ORAL | Status: DC | PRN

## 2022-12-25 NOTE — Telephone Encounter (Signed)
Result discussed with Daughter Bjorn Loser - BP ~113-120/60 range. Current medications for BP- Lasix 40 mg daily, HCTZ 12.5 mg daily, Micardis 80 mg daily and amlodipine 10 mg daily. Advised patient to start taking lasix 40 mg PRN and start taking magnesium oxide 400 mg daily. Continue taking Amlodipine 10 mg daily, Micardis 80 mg daily and hydrochlorothiazide 12.5 mg daily. Follow up with PharmD on 12/28/22

## 2022-12-28 ENCOUNTER — Ambulatory Visit: Payer: Medicare Other | Attending: Cardiovascular Disease | Admitting: Student

## 2022-12-28 ENCOUNTER — Encounter: Payer: Self-pay | Admitting: Student

## 2022-12-28 VITALS — BP 124/60 | HR 68

## 2022-12-28 DIAGNOSIS — I10 Essential (primary) hypertension: Secondary | ICD-10-CM | POA: Insufficient documentation

## 2022-12-28 NOTE — Patient Instructions (Signed)
Changes made by your pharmacist Carmela Hurt, PharmD at today's visit:    Instructions/Changes  (what do you need to do) Your Notes  (what you did and when you did it)  Continue taking hydrochlorothiazide 12.5 mg daily, Lasix 40 mg daily only when needed, amlodipine 10 mg daily, Micardis 80 mg daily    Cut down on number of cigarettes from 10 to 8 per day in 2 weeks and reduce by 2 cigarettes per week    Cut down on coffee intake     Bring all of your meds, your BP cuff and your record of home blood pressures to your next appointment.    HOW TO TAKE YOUR BLOOD PRESSURE AT HOME  Rest 5 minutes before taking your blood pressure.  Don't smoke or drink caffeinated beverages for at least 30 minutes before. Take your blood pressure before (not after) you eat. Sit comfortably with your back supported and both feet on the floor (don't cross your legs). Elevate your arm to heart level on a table or a desk. Use the proper sized cuff. It should fit smoothly and snugly around your bare upper arm. There should be enough room to slip a fingertip under the cuff. The bottom edge of the cuff should be 1 inch above the crease of the elbow. Ideally, take 3 measurements at one sitting and record the average.  Important lifestyle changes to control high blood pressure  Intervention  Effect on the BP  Lose extra pounds and watch your waistline Weight loss is one of the most effective lifestyle changes for controlling blood pressure. If you're overweight or obese, losing even a small amount of weight can help reduce blood pressure. Blood pressure might go down by about 1 millimeter of mercury (mm Hg) with each kilogram (about 2.2 pounds) of weight lost.  Exercise regularly As a general goal, aim for at least 30 minutes of moderate physical activity every day. Regular physical activity can lower high blood pressure by about 5 to 8 mm Hg.  Eat a healthy diet Eating a diet rich in whole grains, fruits,  vegetables, and low-fat dairy products and low in saturated fat and cholesterol. A healthy diet can lower high blood pressure by up to 11 mm Hg.  Reduce salt (sodium) in your diet Even a small reduction of sodium in the diet can improve heart health and reduce high blood pressure by about 5 to 6 mm Hg.  Limit alcohol One drink equals 12 ounces of beer, 5 ounces of wine, or 1.5 ounces of 80-proof liquor.  Limiting alcohol to less than one drink a day for women or two drinks a day for men can help lower blood pressure by about 4 mm Hg.   If you have any questions or concerns please use My Chart to send questions or call the office at [phone]

## 2022-12-28 NOTE — Assessment & Plan Note (Signed)
Assessment: BP is controlled in office BP 126/60  heart rate 68 (goal <130/80) Home BP cuff validated found out to be inaccurate  Tolerates current BP well without any side effects Have SOB when he is active but he smokes 10 cig per day  Denies palpitation, chest pain, headaches,or swelling Took lasix 40 mg 2-3 days in last 2 weeks  Drinks 12-15 cups of coffee per day  Leg cramps improved since taking magnesium    Plan:  Given at goal BP not making any medication changes today  Continue taking amlodipine 10mg  daily, furosemide 40mg  prn for swelling, Micardis 80 mg daily, hydrochlorothiazide 12.5 mg daily, magnesium 400 mg daily  Patient to buy new BP monitor and bring it at the next visit for validation  Patient to keep record of BP readings with heart rate and report to [place] at the next visit Patient to cut down on smoking ( from 10 cig per day to 6-7 cig per day) and cut down on caffeine intake (from 12-15 cups to 8-10 cups) in 2 weeks   Patient to see PharmD in 3 weeks for follow up  Follow up lab: magnesium level at next OV

## 2022-12-28 NOTE — Progress Notes (Signed)
Patient ID: Andrew Hunt                 DOB: June 05, 1940                      MRN: [id number]     HPI: Andrew Hunt is a 82 y.o. male referred by Dr. Eldridge Dace to HTN clinic. PMH is significant for CHF, HTN, HLD, coronary artery calcifications noted on prior CT scan, DVT/PE, AAA, TIA, pre-diabetes, and tobacco use. Last seen by Dr Eldridge Dace in April after ER visit for SOB and cough. Furosemide was increased to 40mg  daily and updated echo was checked on 08/24/22 showing normal LV/RV function, EF of 60-65%.  BP was well controlled at 5/16 PharmD appt. Pt reported cramping, Mg was found to be low at 1.2 and he was started on supplementation. BP well controlled at 6/6 PharmD follow up however home cuff brought to that appt was measuring 20-45mmHg higher than clinic cuff. He was advised to purchase new cuff and presents today for follow up.  Last PharmD visit 10/30/22 pt reported taking all his BP meds in the morning around 7-8am. Rarely taking a second Lasix dose and experiencing some cramping when he does, but tolerating 1 Lasix daily well. Smoking about 8 cigarettes a day still. His wife was in the hospital and improving but understandably still causing him stress likely contributing to elevated BP (162/64). Some home BP readings at goal and some were still elevated. Pt had recently purchased new BP cuff he plans to bring to next visit for accuracy testing. Losartan 100 mg was switched to telmisartan 80 mg for better BP lowering. Bystolic was decreased from 10 mg to 5 mg due to some home heart rates in the 40s. Patient went to ED on 12/12/2022 his heart rate was too low so Bystolic was stopped and lasix 40 mg were changed from daily to prn.  After adding hydrochlorothiazide low dose his BMP was WNL but magnesium was low so magnesium supplement was started on 12/25/2022.   Today patient presented with his daughter for hypertension follow up. He brought in his home BP monitor for validation found out to  be inaccurate. He was having leg cramps, after starting magnesium supplement he no longer have cramps. He drinks 12-15 cups of coffee per day and still smokes 10 cigarettes per day. He is motivated to stop smoking and cut down on caffeine intake . He will be going to PCP to get Chantix re-prescribe. Quit successfully in the past with Chantix. He eats home cooked no salt added food and does not eat any salty snacks. Lately he has been drinking enough water and will continue with it. He has used lasix 2-3 times in past week.  He gets SOB when he is active but he thinks that is from him smoking. After ED visit he has not had any dizzy spells. Denies swelling,palpitation or chest pain. His wife is going through chemotherapy and it is stressful for him. However, his 2 daughters are helping them out and lately his stress level has somewhat went down.    Current HTN meds:  Amlodipine 10 mg daily, Micardis 80 mg daily and hydrochlorothiazide 12.5 mg daily Lasix 40 mg PRN.  BP goal: <130/60mmHg  Family History: Diabetes Mellitus II in his mother; Kidney disease in his mother; Lung cancer in his sister.   Social History:   Tobacco Smoking: 10 cigarettes per day  Alcohol: none  Recreational drug use:  none  Diet: does not eat can food and does not add salt to food, mainly eats home cooked meal   Exercise: Stays active with yard work, takes dog to walks 20 min at time 2-3 times per day   Home BP readings: varies but home BP monitor was inaccurate will be buying new one   Labs:  08/24/22: SCr 1.21, Na 140, K 4.2  Will check lab today after switching losartan to telmisartan   Wt Readings from Last 3 Encounters:  12/13/22 169 lb 8.5 oz (76.9 kg)  11/27/22 173 lb 9.6 oz (78.7 kg)  07/25/22 171 lb (77.6 kg)   BP Readings from Last 3 Encounters:  12/28/22 (!) 122/58  12/13/22 (!) 165/64  11/27/22 (!) 142/60   Pulse Readings from Last 3 Encounters:  12/28/22 68  12/13/22 (!) 57  11/27/22 (!) 50     Renal function: CrCl cannot be calculated (Unknown ideal weight.).  Past Medical History:  Diagnosis Date   AAA (abdominal aortic aneurysm) (HCC)    Arthritis    GERD (gastroesophageal reflux disease)    Hyperlipidemia    Hypertension    Lung nodule    TIA (transient ischemic attack)     Current Outpatient Medications on File Prior to Visit  Medication Sig Dispense Refill   acetaminophen (TYLENOL) 325 MG tablet Take 2 tablets (650 mg total) by mouth every 6 (six) hours as needed for mild pain, fever or headache (or Fever >/= 101). 30 tablet 1   amLODipine (NORVASC) 10 MG tablet Take 10 mg by mouth daily.     apixaban (ELIQUIS) 2.5 MG TABS tablet Take 1 tablet (2.5 mg total) by mouth 2 (two) times daily. 60 tablet 11   atorvastatin (LIPITOR) 80 MG tablet Take 1 tablet (80 mg total) by mouth daily. 30 tablet 0   furosemide (LASIX) 40 MG tablet Take 1 tablet (40 mg total) by mouth daily as needed.     Glucosamine-Chondroit-Vit C-Mn (GLUCOSAMINE 1500 COMPLEX PO) Take 1,500 mg by mouth daily.     hydrochlorothiazide (HYDRODIURIL) 12.5 MG tablet Take 1 tablet (12.5 mg total) by mouth daily.     Magnesium 400 MG TABS Take 400 mg by mouth daily.     omeprazole (PRILOSEC) 20 MG capsule Take 20 mg by mouth daily.     tamsulosin (FLOMAX) 0.4 MG CAPS capsule Take 0.4 mg by mouth 2 (two) times daily.     telmisartan (MICARDIS) 80 MG tablet Take 1 tablet (80 mg total) by mouth daily. 90 tablet 3   tiZANidine (ZANAFLEX) 4 MG tablet Take 2-4 mg by mouth at bedtime as needed.     No current facility-administered medications on file prior to visit.    Allergies  Allergen Reactions   Hydrocodone Itching   Oxycodone Itching   Vioxx [Rofecoxib] Swelling and Other (See Comments)    Caused mini-strokes     Assessment/Plan:  1. Hypertension -   Assessment: BP is controlled in office BP 126/60  heart rate 68 (goal <130/80) Home BP cuff validated found out to be inaccurate  Tolerates  current BP well without any side effects Have SOB when he is active but he smokes 10 cig per day  Denies palpitation, chest pain, headaches,or swelling Took lasix 40 mg 2-3 days in last 2 weeks  Drinks 12-15 cups of coffee per day  Leg cramps improved since taking magnesium      Plan:  Given at goal BP not making any medication changes today  Continue  taking amlodipine 10mg  daily, furosemide 40mg  prn for swelling, Micardis 80 mg daily, hydrochlorothiazide 12.5 mg daily, magnesium 400 mg daily  Patient to buy new BP monitor and bring it at the next visit for validation  Patient to keep record of BP readings with heart rate and report to [place] at the next visit Patient to cut down on smoking ( from 10 cig per day to 6-7 cig per day) and cut down on caffeine intake (from 12-15 cups to 8-10 cups) in 2 weeks   Patient to see PharmD in 3 weeks for follow up  Follow up lab: magnesium level at next OV

## 2023-01-05 ENCOUNTER — Other Ambulatory Visit: Payer: Self-pay | Admitting: *Deleted

## 2023-01-05 DIAGNOSIS — I714 Abdominal aortic aneurysm, without rupture, unspecified: Secondary | ICD-10-CM

## 2023-01-08 NOTE — Progress Notes (Unsigned)
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  The aphasia and questionable weakness likely due to his confusion and poor ability to follow commands rather than true aphasia or hemiplegia.  Regardless, he does have history of TIA and should be managed for secondary stroke prevention.    Secondary stroke prevention as managed by PCP/cardiology: Eliquis Normotensive blood pressure Statin.  LDL goal less than 70 Hgb N6E goal less than 7 Follow up as needed.  Total time spent reviewing chart, imaging, test results and face to face with patient and his daughter discussing diagnosis:  46 minutes.   Subjective:  Andrew Hunt is an 82 year old right-handed male with HTN, HLD, AAA, COPD, and history of PE/DVT on Eliquis and TIA who presents for encephalopathy.  History supplemented by his accompanying daughter and hospital records.     On 9/3, he started feeling dizzy, off balance and exhibited slurred speech.  He sat down and doesn't remember anything until the following morning.  He did not actually lose consciousness.  His daughter came to the house and found him sitting in the chair.  She told him she was calling 911 and he responded not to call.  EMS was called.  He was not following commands.  He was bradycardic as low as 45.  He recently had several medication changes over the previous few weeks and thinks he may have taken two of his Bystolic.  He didn't yet eat breakfast that morning.  On arrival in the ED, he appeared globally aphasic, unable to speak and not following commands.  He also exhibited inconsistent blink to threat and had questionable trace weakness worse on the right side but no facial  droop.  ABG revealed hypoxia with pO2 of 62.  CXR negative for PNA.  No evidence of infection based on CBC and UA.  Urine drug screen negative.  CMP revealed mildly low Na 132, glucose 122 and baseline kidney insufficiency (Cr 1.29 and GFR 55) but otherwise unremarkable.  Hgb A1c and LDL were 6.3 and 139 respectively.  CT head revealed no acute intracranial abnormality.  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  EEG was normal.  2D echo showed EF 60-65% with no cardiac source of emboli.  It was discovered that he was mistakenly only taking Eliquis once a day instead of twice a day.  He was instructed to take as prescribed.  Lipitor was increased from 40mg  to 80mg  daily.  Due to bradycardia, Bystolic was decreased from 10mg  to 5mg  daily.  PAST MEDICAL HISTORY: Past Medical History:  Diagnosis Date   AAA (abdominal aortic aneurysm) (HCC)    Arthritis    GERD (gastroesophageal reflux disease)    Hyperlipidemia    Hypertension    Lung nodule    TIA (transient ischemic attack)     PAST SURGICAL HISTORY: Past Surgical History:  Procedure Laterality Date   APPENDECTOMY     BACK SURGERY  CHOLECYSTECTOMY     COLONOSCOPY N/A 05/05/2014   Procedure: COLONOSCOPY;  Surgeon: Corbin Ade, MD;  Location: [facility];  Service: Endoscopy;  Laterality: N/A;  100   MULTIPLE TOOTH EXTRACTIONS      MEDICATIONS: Current Outpatient Medications on File Prior to Visit  Medication Sig Dispense Refill   acetaminophen (TYLENOL) 325 MG tablet Take 2 tablets (650 mg total) by mouth every 6 (six) hours as needed for mild pain, fever or headache (or Fever >/=  101). 30 tablet 1   amLODipine (NORVASC) 10 MG tablet Take 10 mg by mouth daily.     apixaban (ELIQUIS) 2.5 MG TABS tablet Take 1 tablet (2.5 mg total) by mouth 2 (two) times daily. 60 tablet 11   atorvastatin (LIPITOR) 80 MG tablet Take 1 tablet (80 mg total) by mouth daily. 30 tablet 0   furosemide (LASIX) 40 MG tablet Take 1 tablet (40 mg total) by mouth daily as needed.     Glucosamine-Chondroit-Vit C-Mn (GLUCOSAMINE 1500 COMPLEX PO) Take 1,500 mg by mouth daily.     hydrochlorothiazide (HYDRODIURIL) 12.5 MG tablet Take 1 tablet (12.5 mg total) by mouth daily.     Magnesium 400 MG TABS Take 400 mg by mouth daily.     omeprazole (PRILOSEC) 20 MG capsule Take 20 mg by mouth daily.     tamsulosin (FLOMAX) 0.4 MG CAPS capsule Take 0.4 mg by mouth 2 (two) times daily.     telmisartan (MICARDIS) 80 MG tablet Take 1 tablet (80 mg total) by mouth daily. 90 tablet 3   tiZANidine (ZANAFLEX) 4 MG tablet Take 2-4 mg by mouth at bedtime as needed.     No current facility-administered medications on file prior to visit.    ALLERGIES: Allergies  Allergen Reactions   Hydrocodone Itching   Oxycodone Itching   Vioxx [Rofecoxib] Swelling and Other (See Comments)    Caused mini-strokes    FAMILY HISTORY: Family History  Problem Relation Age of Onset   Diabetes Mellitus II Mother    Kidney disease Mother    Lung cancer Sister     Objective:  Blood pressure 131/70, pulse 71, height 5\' 6"  (1.676 m), weight 171 lb 12.8 oz (77.9 kg), SpO2 96%. General: No acute distress.  Patient appears well-groomed.   Head:  Normocephalic/atraumatic Eyes:  fundi examined but not visualized Heart: regular rate and rhythm Neurological Exam: Mental status: alert and oriented to person, place, and time, speech fluent and not dysarthric, language intact. Cranial nerves: CN I: not tested CN II: pupils equal, round and reactive to light, visual fields intact CN III, IV, VI:  full range of motion, no nystagmus, no  ptosis CN V: facial sensation intact. CN VII: upper and lower face symmetric CN VIII: hearing intact CN IX, X: gag intact, uvula midline CN XI: sternocleidomastoid and trapezius muscles intact CN XII: tongue midline Bulk & Tone: normal, no fasciculations. Motor:  muscle strength 5/5 throughout Sensation:  Pinprick and vibratory sensation intact. Deep Tendon Reflexes:  1+ throughout,  toes downgoing.   Finger to nose testing:  Without dysmetria.    Gait:  Normal station and stride.  Romberg negative.    Thank you for allowing me to take part in the care of this patient.  Shon Millet, DO  CC: Antony Haste, MD

## 2023-01-09 ENCOUNTER — Ambulatory Visit (INDEPENDENT_AMBULATORY_CARE_PROVIDER_SITE_OTHER): Payer: Medicare Other | Admitting: Neurology

## 2023-01-09 ENCOUNTER — Encounter: Payer: Self-pay | Admitting: Neurology

## 2023-01-09 VITALS — BP 131/70 | HR 71 | Ht 66.0 in | Wt 171.8 lb

## 2023-01-09 DIAGNOSIS — G931 Anoxic brain damage, not elsewhere classified: Secondary | ICD-10-CM

## 2023-01-09 NOTE — Patient Instructions (Signed)
I think the confusion was due to decreased oxygen/blood flow to the brain due to potentially taking too much blood pressure medication.  I don't suspect mini stroke, seizure or other possibility

## 2023-01-12 ENCOUNTER — Other Ambulatory Visit (HOSPITAL_COMMUNITY): Payer: Medicare Other

## 2023-01-12 ENCOUNTER — Ambulatory Visit: Payer: Medicare Other

## 2023-01-23 ENCOUNTER — Other Ambulatory Visit (HOSPITAL_COMMUNITY): Payer: Self-pay

## 2023-01-23 ENCOUNTER — Encounter: Payer: Self-pay | Admitting: Student

## 2023-01-23 ENCOUNTER — Ambulatory Visit: Payer: Medicare Other | Attending: Cardiology | Admitting: Student

## 2023-01-23 VITALS — BP 124/69 | HR 76 | Wt 162.2 lb

## 2023-01-23 DIAGNOSIS — I10 Essential (primary) hypertension: Secondary | ICD-10-CM | POA: Diagnosis present

## 2023-01-23 NOTE — Assessment & Plan Note (Signed)
Assessment: BP is controlled in office BP 124/69  heart rate 76 (goal <130/80) Home BP cuff validated found out to be accurate , most home BP readings are at goal except some readings are above goal due to stress  Tolerates current BP well without any side effects Cut dow on cigarette from 10 per day to 8 per day  Denies palpitation, chest pain, headaches,or swelling Caffeine intake is down to 4 cups per day from 12-15 cups per day     Plan:  Given at goal BP not making any medication changes today  Continue taking amlodipine 10mg  daily, furosemide 40mg  prn for swelling, Micardis 80 mg daily, hydrochlorothiazide 12.5 mg daily, magnesium 400 mg daily  Patient to keep record of BP readings with heart rate and report to [place] at the next visit Patient to cut down on smoking ( from 8 cig per day to 5 cig per day) and cut down on caffeine intake (from 4 cups to 1-2 cups) in 6 weeks   Patient to see PharmD in 6 weeks for follow up  Follow up lab: magnesium level today

## 2023-01-23 NOTE — Progress Notes (Signed)
Patient ID: Andrew Hunt                 DOB: 01/13/1941                      MRN: [id number]     HPI: Andrew Hunt is a 82 y.o. male referred by Dr. Eldridge Dace to HTN clinic. PMH is significant for CHF, HTN, HLD, coronary artery calcifications noted on prior CT scan, DVT/PE, AAA, TIA, pre-diabetes, and tobacco use. Last seen by Dr Eldridge Dace in April after ER visit for SOB and cough. Furosemide was increased to 40mg  daily and updated echo was checked on 08/24/22 showing normal LV/RV function, EF of 60-65%.  BP was well controlled at 5/16 PharmD appt. Pt reported cramping, Mg was found to be low at 1.2 and he was started on supplementation. BP well controlled at 6/6 PharmD follow up however home cuff brought to that appt was measuring 20-63mmHg higher than clinic cuff. PharmD visit 10/30/22 pt reported taking all his BP meds in the morning around 7-8am. Rarely taking a second Lasix dose and experiencing some cramping when he does, but tolerating 1 Lasix daily well. Smoking about 8 cigarettes a day still. His wife was in the hospital and improving but understandably still causing him stress likely contributing to elevated BP (162/64). Some home BP readings at goal and some were still elevated. Pt had recently purchased new BP cuff he plans to bring to next visit for accuracy testing. Losartan 100 mg was switched to telmisartan 80 mg for better BP lowering. Bystolic was decreased from 10 mg to 5 mg due to some home heart rates in the 40s. Patient went to ED on 12/12/2022 his heart rate was too low so Bystolic was stopped and lasix 40 mg were changed from daily to prn.  After adding hydrochlorothiazide low dose his BMP was WNL but magnesium was low so magnesium supplement was started on 12/25/2022.  Patient saw me on 9/19, no medication changed, patient to cut down on smoking and caffeine intake and suggested to get new home BP monitor. We will draw magnesium - hx of hypomagnesemia and cramps.    Today  patient presented with his new home BP monitor and reports his home BP varies highest 154/82 heart rate 70 and lowest 127/66 heart rate 70. He has cut down on number of cigarettes from 10 pre day to 8 per day and he drinks now half decaf and half regular blend and only 4 cups per day (3 in AM and 1 in PM). Denies dizziness, SOB, palpitation, swelling or chest pain. Takes magnesium supplement regularly and no more lower legs cramps. We validated home monitor it reads within 10 points. 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 Patient is in agreement to lower caffeine intake and cut down on number of cigarettes to 5/day.    Current HTN meds:  Amlodipine 10 mg daily, Micardis 80 mg daily and hydrochlorothiazide 12.5 mg daily Lasix 40 mg PRN.  BP goal: <130/21mmHg  Family History: Diabetes Mellitus II in his mother; Kidney disease in his mother; Lung cancer in his sister.   Social History:  Tobacco Smoking: 8 cigarettes per day  Alcohol: none  Recreational drug use: none  Diet: does not eat can food and does not add salt to food, mainly eats home cooked meal   Exercise: Stays active with yard work, takes dog to walks  20 min at time 2-3 times per day   Home BP readings:    Labs: we will get magnesium lab today   Wt Readings from Last 3 Encounters:  01/23/23 162 lb 3.2 oz (73.6 kg)  01/09/23 171 lb 12.8 oz (77.9 kg)  12/13/22 169 lb 8.5 oz (76.9 kg)   BP Readings from Last 3 Encounters:  01/23/23 124/69  01/09/23 131/70  12/28/22 124/60   Pulse Readings from Last 3 Encounters:  01/23/23 76  01/09/23 71  12/28/22 68    Renal function: CrCl cannot be calculated (Patient's most recent lab result is older than the maximum 21 days allowed.).  Past Medical History:  Diagnosis Date   AAA (abdominal aortic aneurysm) (HCC)    Arthritis    GERD (gastroesophageal reflux disease)    Hyperlipidemia    Hypertension    Lung nodule    TIA  (transient ischemic attack)     Current Outpatient Medications on File Prior to Visit  Medication Sig Dispense Refill   acetaminophen (TYLENOL) 325 MG tablet Take 2 tablets (650 mg total) by mouth every 6 (six) hours as needed for mild pain, fever or headache (or Fever >/= 101). 30 tablet 1   amLODipine (NORVASC) 10 MG tablet Take 10 mg by mouth daily.     apixaban (ELIQUIS) 2.5 MG TABS tablet Take 1 tablet (2.5 mg total) by mouth 2 (two) times daily. 60 tablet 11   atorvastatin (LIPITOR) 80 MG tablet Take 1 tablet (80 mg total) by mouth daily. 30 tablet 0   furosemide (LASIX) 40 MG tablet Take 1 tablet (40 mg total) by mouth daily as needed.     Glucosamine-Chondroit-Vit C-Mn (GLUCOSAMINE 1500 COMPLEX PO) Take 1,500 mg by mouth daily.     hydrochlorothiazide (HYDRODIURIL) 12.5 MG tablet Take 1 tablet (12.5 mg total) by mouth daily.     Magnesium 400 MG TABS Take 400 mg by mouth daily.     omeprazole (PRILOSEC) 20 MG capsule Take 20 mg by mouth daily.     tamsulosin (FLOMAX) 0.4 MG CAPS capsule Take 0.4 mg by mouth 2 (two) times daily.     telmisartan (MICARDIS) 80 MG tablet Take 1 tablet (80 mg total) by mouth daily. 90 tablet 3   tiZANidine (ZANAFLEX) 4 MG tablet Take 2-4 mg by mouth at bedtime as needed.     No current facility-administered medications on file prior to visit.    Allergies  Allergen Reactions   Hydrocodone Itching   Oxycodone Itching   Vioxx [Rofecoxib] Swelling and Other (See Comments)    Caused mini-strokes     Assessment/Plan:  1. Hypertension -   Assessment: BP is controlled in office BP 124/69  heart rate 76 (goal <130/80) Home BP cuff validated found out to be accurate , most home BP readings are at goal except some readings are above goal due to stress  Tolerates current BP well without any side effects Cut dow on cigarette from 10 per day to 8 per day  Denies palpitation, chest pain, headaches,or swelling Caffeine intake is down to 4 cups per day  from 12-15 cups per day        Plan:  Given at goal BP not making any medication changes today  Continue taking amlodipine 10mg  daily, furosemide 40mg  prn for swelling, Micardis 80 mg daily, hydrochlorothiazide 12.5 mg daily, magnesium 400 mg daily  Patient to keep record of BP readings with heart rate and report to [place] at the next visit Patient  to cut down on smoking ( from 8 cig per day to 5 cig per day) and cut down on caffeine intake (from 4 cups to 1-2 cups) in 6 weeks   Patient to see PharmD in 6 weeks for follow up  Follow up lab: magnesium level today

## 2023-01-23 NOTE — Patient Instructions (Signed)
No Changes to your blood pressure medications made by your pharmacist Carmela Hurt, PharmD at today's visit:    Instructions/Changes  (what do you need to do) Your Notes  (what you did and when you did it)  Cut down caffeine intake from 4 cups to 2 cups per day    Cut down on number of cigarettes from 8 per day to 5 per day in 6 weeks    Continue with low salt diet     Bring all of your meds, your BP cuff and your record of home blood pressures to your next appointment.    HOW TO TAKE YOUR BLOOD PRESSURE AT HOME  Rest 5 minutes before taking your blood pressure.  Don't smoke or drink caffeinated beverages for at least 30 minutes before. Take your blood pressure before (not after) you eat. Sit comfortably with your back supported and both feet on the floor (don't cross your legs). Elevate your arm to heart level on a table or a desk. Use the proper sized cuff. It should fit smoothly and snugly around your bare upper arm. There should be enough room to slip a fingertip under the cuff. The bottom edge of the cuff should be 1 inch above the crease of the elbow. Ideally, take 3 measurements at one sitting and record the average.  Important lifestyle changes to control high blood pressure  Intervention  Effect on the BP  Lose extra pounds and watch your waistline Weight loss is one of the most effective lifestyle changes for controlling blood pressure. If you're overweight or obese, losing even a small amount of weight can help reduce blood pressure. Blood pressure might go down by about 1 millimeter of mercury (mm Hg) with each kilogram (about 2.2 pounds) of weight lost.  Exercise regularly As a general goal, aim for at least 30 minutes of moderate physical activity every day. Regular physical activity can lower high blood pressure by about 5 to 8 mm Hg.  Eat a healthy diet Eating a diet rich in whole grains, fruits, vegetables, and low-fat dairy products and low in saturated fat and  cholesterol. A healthy diet can lower high blood pressure by up to 11 mm Hg.  Reduce salt (sodium) in your diet Even a small reduction of sodium in the diet can improve heart health and reduce high blood pressure by about 5 to 6 mm Hg.  Limit alcohol One drink equals 12 ounces of beer, 5 ounces of wine, or 1.5 ounces of 80-proof liquor.  Limiting alcohol to less than one drink a day for women or two drinks a day for men can help lower blood pressure by about 4 mm Hg.   If you have any questions or concerns please use My Chart to send questions or call the office at [phone]

## 2023-01-24 LAB — MAGNESIUM: Magnesium: 1.8 mg/dL (ref 1.6–2.3)

## 2023-02-16 ENCOUNTER — Ambulatory Visit (HOSPITAL_COMMUNITY)
Admission: RE | Admit: 2023-02-16 | Discharge: 2023-02-16 | Disposition: A | Discharge status: Home / Self Care | Payer: Medicare Other | Source: Ambulatory Visit | Attending: Physician Assistant

## 2023-02-16 ENCOUNTER — Ambulatory Visit (INDEPENDENT_AMBULATORY_CARE_PROVIDER_SITE_OTHER): Payer: Medicare Other | Admitting: Physician Assistant

## 2023-02-16 VITALS — BP 154/75 | HR 70 | Temp 97.2°F | Ht 66.0 in | Wt 169.1 lb

## 2023-02-16 DIAGNOSIS — I714 Abdominal aortic aneurysm, without rupture, unspecified: Secondary | ICD-10-CM

## 2023-02-16 DIAGNOSIS — F17200 Nicotine dependence, unspecified, uncomplicated: Secondary | ICD-10-CM

## 2023-02-16 NOTE — Progress Notes (Signed)
Office Note     CC:  follow up Requesting Provider:  Eartha Inch, MD  HPI: Andrew Hunt is a 82 y.o. ([birth year]) male who presents for surveillance of AAA.  He was last seen in the office in October of last year.  At that time AAA measured 4.8 cm in largest diameter.  He has chronic back pain however he has not experienced any new or changing abdominal or back pain since last office visit.  He also denies any tissue changes of bilateral lower extremities.  He has had no popliteal aneurysms identified on duplex.  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  He smokes about half pack a day.   Past Medical History:  Diagnosis Date   AAA (abdominal aortic aneurysm) (HCC)    Arthritis    GERD (gastroesophageal reflux disease)    Hyperlipidemia    Hypertension    Lung nodule    TIA (transient ischemic attack)     Past Surgical History:  Procedure Laterality Date   APPENDECTOMY     BACK SURGERY     CHOLECYSTECTOMY     COLONOSCOPY N/A 05/05/2014   Procedure: COLONOSCOPY;  Surgeon: Corbin Ade, MD;  Location: [facility];  Service: Endoscopy;  Laterality: N/A;  100   MULTIPLE TOOTH EXTRACTIONS      Social History   Socioeconomic History   Marital status: Married    Spouse name: Not on file   Number of children: Not on file   Years of education: Not on file   Highest education level: Not on file  Occupational History   Not on file  Tobacco Use   Smoking status: Every Day    Current packs/day: 0.25    Average packs/day: 0.3 packs/day for 62.0 years (15.5 ttl pk-yrs)    Types: Cigarettes   Smokeless tobacco: Never   Tobacco comments:    Approximately 1/3 pack per day currently  Vaping Use   Vaping status: Never Used  Substance and Sexual Activity   Alcohol use: No    Alcohol/week: 0.0 standard drinks of alcohol   Drug use: No   Sexual activity: Not Currently  Other Topics Concern   Not on file  Social History Narrative    Right handed one level home   Are you right handed or left handed? Right   Are you currently employed ? N   What is your current occupation? Retired   Do you live at home alone? N   Who lives with you?  Wife   What type of home do you live in: 1 story or 2 story? 1       Social Determinants of Health   Financial Resource Strain: Low Risk  (07/27/2022)   Received from [facility], [facility]   Overall Financial Resource Strain (CARDIA)    Difficulty of Paying Living Expenses: Not very hard  Food Insecurity: No Food Insecurity (12/12/2022)   Hunger Vital Sign    Worried About Running Out of Food in the Last Year: Never true    Ran Out of Food in the Last Year: Never true  Transportation Needs: No Transportation Needs (12/12/2022)   PRAPARE - Administrator, Civil Service (Medical): No    Lack of Transportation (Non-Medical): No  Physical Activity: Sufficiently Active (07/27/2022)   Received from [facility], [facility]   Exercise Vital Sign    Days of Exercise per Week: 5  days    Minutes of Exercise per Session: 60 min  Stress: No Stress Concern Present (07/27/2022)   Received from [facility], [facility] - Occupational Stress Questionnaire    Feeling of Stress : Not at all  Social Connections: Socially Integrated (07/27/2022)   Received from [facility], [facility]   Social Network    How would you rate your social network (family, work, friends)?: Good participation with social networks  Intimate Partner Violence: Not At Risk (12/12/2022)   Humiliation, Afraid, Rape, and Kick questionnaire    Fear of Current or Ex-Partner: No    Emotionally Abused: No    Physically Abused: No    Sexually Abused: No    Family History  Problem Relation Age of Onset   Seizures Mother    Diabetes Mellitus II Mother    Kidney disease Mother    Lung cancer Sister     Current Outpatient Medications  Medication Sig Dispense  Refill   acetaminophen (TYLENOL) 325 MG tablet Take 2 tablets (650 mg total) by mouth every 6 (six) hours as needed for mild pain, fever or headache (or Fever >/= 101). 30 tablet 1   amLODipine (NORVASC) 10 MG tablet Take 10 mg by mouth daily.     apixaban (ELIQUIS) 2.5 MG TABS tablet Take 1 tablet (2.5 mg total) by mouth 2 (two) times daily. 60 tablet 11   atorvastatin (LIPITOR) 80 MG tablet Take 1 tablet (80 mg total) by mouth daily. 30 tablet 0   furosemide (LASIX) 40 MG tablet Take 1 tablet (40 mg total) by mouth daily as needed.     Glucosamine-Chondroit-Vit C-Mn (GLUCOSAMINE 1500 COMPLEX PO) Take 1,500 mg by mouth daily.     hydrochlorothiazide (HYDRODIURIL) 12.5 MG tablet Take 1 tablet (12.5 mg total) by mouth daily.     Magnesium 400 MG TABS Take 400 mg by mouth daily.     omeprazole (PRILOSEC) 20 MG capsule Take 20 mg by mouth daily.     tamsulosin (FLOMAX) 0.4 MG CAPS capsule Take 0.4 mg by mouth 2 (two) times daily.     telmisartan (MICARDIS) 80 MG tablet Take 1 tablet (80 mg total) by mouth daily. 90 tablet 3   tiZANidine (ZANAFLEX) 4 MG tablet Take 2-4 mg by mouth at bedtime as needed. (Patient not taking: Reported on 02/16/2023)     No current facility-administered medications for this visit.    Allergies  Allergen Reactions   Hydrocodone Itching   Oxycodone Itching   Vioxx [Rofecoxib] Swelling and Other (See Comments)    Caused mini-strokes     REVIEW OF SYSTEMS:   [X]  denotes positive finding, [ ]  denotes negative finding Cardiac  Comments:  Chest pain or chest pressure:    Shortness of breath upon exertion:    Short of breath when lying flat:    Irregular heart rhythm:        Vascular    Pain in calf, thigh, or hip brought on by ambulation:    Pain in feet at night that wakes you up from your sleep:     Blood clot in your veins:    Leg swelling:         Pulmonary    Oxygen at home:    Productive cough:     Wheezing:         Neurologic    Sudden weakness  in arms or legs:     Sudden numbness in arms or  legs:     Sudden onset of difficulty speaking or slurred speech:    Temporary loss of vision in one eye:     Problems with dizziness:         Gastrointestinal    Blood in stool:     Vomited blood:         Genitourinary    Burning when urinating:     Blood in urine:        Psychiatric    Major depression:         Hematologic    Bleeding problems:    Problems with blood clotting too easily:        Skin    Rashes or ulcers:        Constitutional    Fever or chills:      PHYSICAL EXAMINATION:  Vitals:   02/16/23 1430  BP: (!) 154/75  Pulse: 70  Temp: (!) 97.2 F (36.2 C)  SpO2: 95%  Weight: 169 lb 1.6 oz (76.7 kg)  Height: 5\' 6"  (1.676 m)    General:  WDWN in NAD; vital signs documented above Gait: Not observed HENT: WNL, normocephalic Pulmonary: normal non-labored breathing , without Rales, rhonchi,  wheezing Cardiac: regular HR Abdomen: soft, NT, no masses Skin: without rashes Vascular Exam/Pulses: Symmetrical radial and PT pulses Extremities: without ischemic changes, without Gangrene , without cellulitis; without open wounds;  Musculoskeletal: no muscle wasting or atrophy  Neurologic: A&O X 3 Psychiatric:  The pt has Normal affect.   Non-Invasive Vascular Imaging:   4.7 cm AAA at largest diameter    ASSESSMENT/PLAN:: 82 y.o. male here for surveillance of AAA  -AAA stable measuring 4.7 at largest diameter.  AAA measured 4.5 cm by CT in December 2020.  He has been screened for popliteal aneurysms in the past which were negative.  Bilateral lower extremities are well-perfused with palpable PT pulses.  No indication to repair AAA currently.  We will repeat duplex in 1 year.  Smoking cessation was encouraged.  Patient knows to call/return to office sooner with any questions or concerns.   Emilie Rutter, PA-C [facility] [phone]  Clinic MD:   Hetty Blend on call

## 2023-03-02 ENCOUNTER — Other Ambulatory Visit: Payer: Self-pay

## 2023-03-02 DIAGNOSIS — I714 Abdominal aortic aneurysm, without rupture, unspecified: Secondary | ICD-10-CM

## 2023-03-06 ENCOUNTER — Ambulatory Visit: Payer: Medicare Other | Attending: Cardiovascular Disease | Admitting: Pharmacist

## 2023-03-06 VITALS — BP 137/70 | HR 64

## 2023-03-06 DIAGNOSIS — I10 Essential (primary) hypertension: Secondary | ICD-10-CM | POA: Diagnosis present

## 2023-03-06 NOTE — Progress Notes (Signed)
Patient ID: Andrew Hunt                 DOB: 05-11-1940                      MRN: [id number]     HPI: Andrew Hunt is a 82 y.o. male referred by Dr. Eldridge Dace to HTN clinic. PMH is significant for CHF, HTN, HLD, coronary artery calcifications noted on prior CT scan, DVT/PE, AAA, TIA, pre-diabetes, and tobacco use. Seen by Dr Eldridge Dace in April after ER visit for SOB and cough. Furosemide was increased to 40mg  daily and updated echo was checked on 08/24/22 showing normal LV/RV function, EF of 60-65%.  Patients BP has been controlled at clinic for the past 2 months. Has a history of hypomagnesemia that required supplementation multiple times. PharmD visit 10/30/22 pt reported taking all his BP meds in the morning around 7-8am. Rarely taking a second Lasix dose and experiencing some cramping when he does, but tolerating 1 Lasix daily well. Patients wife has been sick since mid-2024 which is ikely contributing to elevated BP. At 7/22 visit, losartan 100 mg was switched to telmisartan 80 mg for better BP lowering. Nebivolol was decreased from 10 mg to 5 mg due to some home heart rates in the 40s. Patient went to ED on 12/12/2022 his heart rate was low so Nebivolol was stopped and lasix 40 mg were changed from daily to prn. At 8/19 visit, hydrochlorothiazide 12.5mg  daily was added and  After adding hydrochlorothiazide 12.5mg  daily his BMP was WNL. At 9/19 visit, magnesium was low so magnesium supplement was started. Patient reported smoking 8 cigarettes/d, patient advised to cut down on smoking and caffeine intake.  Patient was last seen in clinic on 10/15. BP in office was controlled <130/80. Reported BP at home ranges from 127/66 - 154/82. Patient bought new home BP cuff that was shown to be accurate. Reported taking care of his sick wife and believes this could be contributing to high BP. Denied dizziness, SOB, palpitation, swelling or chest pain. Takes magnesium supplement regularly and no more lower  legs cramps. Labs on 10/15 showed magnesium improved from 1.3 mg/dL to 1.8 mg/dL. Patient agreed to lower caffeine intake and cut down on number of cigarettes to 5/day.   Patient presents to clinic today for follow-up. Patient had appointment on 11/08 to assess AAA. AAA is stable and measuring 4.7 at largest diameter. No indication to repair. Patient BP in clinic today is 137/70 and 136/68 with a HR of 64. Patient did not bring any home readings with him, but his BP this AM was 137/67 and he says they are usually around that or lower. I am hesitant to increase hydrochlorothiazide d/t hx of electrolyte abnormalities. Patient stated he has had some swelling in past, but not lately and has not had to use his PRN Lasix. Denied SOB, light headedness, or dizziness. Patient communicated he is experiencing hip pain and believes it is the Eliquis or atorvastatin. Patient stated he stopped his Eliquis 2.5mg  BID ~3 weeks ago and restarted it this morning. Patient was slightly confused if he stopped the atorvastatin 80mg  daily or not, he stated later in the visit he was taking it every night. Patients daughter is taking care of his medications with a pill box. Patient states he does not miss any doses except 2-3 times in the past year. Patient has cut back tremendously on smoking 8 cigarettes/d to 0.5 packs/week. Patient has cut  back his coffee intake to 3 cups/d and it still cutting with decaf. Patient is staying active walking to a family members house and continues to do yard and house work.   Current HTN meds:  amlodipine 10 mg daily, telmisartan 80 mg daily and hydrochlorothiazide 12.5 mg daily Lasix 40 mg PRN. Previously tried: Losartan (switched to telmisartan for better BP control), nebivolol (low HR; 40's),   BP goal: <140/64mmHg  Family History: Diabetes Mellitus II in his mother; Kidney disease in his mother; Lung cancer in his sister.   Social History:  Tobacco Smoking: 8 cigarettes per day  Alcohol:  none  Recreational drug use: none   Diet: does not eat can food and does not add salt to food, mainly eats home cooked meal   Exercise: Stays active with yard work, walks to family members house and to pond 2-3 times per week.    Home BP readings: Reading this AM 137/67. Patient reports they stay around that number a little lower   Labs:   Wt Readings from Last 3 Encounters:  02/16/23 169 lb 1.6 oz (76.7 kg)  01/23/23 162 lb 3.2 oz (73.6 kg)  01/09/23 171 lb 12.8 oz (77.9 kg)   BP Readings from Last 3 Encounters:  03/06/23 137/70  02/16/23 (!) 154/75  01/23/23 124/69   Pulse Readings from Last 3 Encounters:  03/06/23 64  02/16/23 70  01/23/23 76    Renal function: CrCl cannot be calculated (Patient's most recent lab result is older than the maximum 21 days allowed.).  Past Medical History:  Diagnosis Date   AAA (abdominal aortic aneurysm) (HCC)    Arthritis    GERD (gastroesophageal reflux disease)    Hyperlipidemia    Hypertension    Lung nodule    TIA (transient ischemic attack)     Current Outpatient Medications on File Prior to Visit  Medication Sig Dispense Refill   acetaminophen (TYLENOL) 325 MG tablet Take 2 tablets (650 mg total) by mouth every 6 (six) hours as needed for mild pain, fever or headache (or Fever >/= 101). 30 tablet 1   amLODipine (NORVASC) 10 MG tablet Take 10 mg by mouth daily.     apixaban (ELIQUIS) 2.5 MG TABS tablet Take 1 tablet (2.5 mg total) by mouth 2 (two) times daily. 60 tablet 11   atorvastatin (LIPITOR) 80 MG tablet Take 1 tablet (80 mg total) by mouth daily. 30 tablet 0   furosemide (LASIX) 40 MG tablet Take 1 tablet (40 mg total) by mouth daily as needed.     Glucosamine-Chondroit-Vit C-Mn (GLUCOSAMINE 1500 COMPLEX PO) Take 1,500 mg by mouth daily.     hydrochlorothiazide (HYDRODIURIL) 12.5 MG tablet Take 1 tablet (12.5 mg total) by mouth daily.     Magnesium 400 MG TABS Take 400 mg by mouth daily.     omeprazole (PRILOSEC) 20  MG capsule Take 20 mg by mouth daily.     tamsulosin (FLOMAX) 0.4 MG CAPS capsule Take 0.4 mg by mouth 2 (two) times daily.     telmisartan (MICARDIS) 80 MG tablet Take 1 tablet (80 mg total) by mouth daily. 90 tablet 3   tiZANidine (ZANAFLEX) 4 MG tablet Take 2-4 mg by mouth at bedtime as needed. (Patient not taking: Reported on 02/16/2023)     No current facility-administered medications on file prior to visit.    Allergies  Allergen Reactions   Hydrocodone Itching   Oxycodone Itching   Vioxx [Rofecoxib] Swelling and Other (See Comments)  Caused mini-strokes    Assessment/Plan:     1. Hypertension -  Hypertension Assessment: Patients BP in office is 134/70 and 136/68 with a HR of 64. Patient states home BP says around the same at home and a little lower.  Currently on amlodipine 10 mg daily, telmisartan 80 mg daily and hydrochlorothiazide 12.5 mg daily Lasix 40 mg PRN.  Stated he has had some swelling in past, but not lately. Not had to use PRN Lasix. Denied SOB, light headedness, or dizziness. I am hesitant to increase hydrochlorothiazide d/t hx of electrolyte abnormalities Discussed having a less stringent BP goal with systolic <140  Patient has decreased the amount of cigarettes from 8 cigarettes/d to 0.5 packs/week. Also, cut back coffee intake to 3 cups/d and it still cutting with decaf. Encouraged patient to continue cutting back smoking. Reported stopping Eliquis 2.5mg  BID 3 weeks ago and restarted this AM. Patient slightly confused if he stopped atorvastatin or not. Later stated he takes it every night. PCP is aware he stopped Eliquis. Was on for PE/DVT in the setting of COVID 4 years ago. Patients daughter takes care of weekly pill box.   Plan:  Continue taking amlodipine 10 mg daily, telmisartan 80 mg daily and hydrochlorothiazide 12.5 mg daily Lasix 40 mg PRN. Cut down more on smoking and caffeine Patient is to call if he has any questions and if systolic pressure is  consistently >140.     Thank you,  Minette Brine, Student Pharmacist  Olene Floss, Pharm.D, BCACP, BCPS, CPP [facility] A Division of [facility] [address] [place], [place] [address]  Phone: [phone]; Fax: [phone]

## 2023-03-06 NOTE — Patient Instructions (Addendum)
Your blood pressure goal is < 130/59mmHg   Continue taking amlodipine 10 mg daily, telmisartan 80 mg daily and hydrochlorothiazide 12.5 mg daily and furosemide 40 mg as needed.  Please let us know if you cannot tolerate your cholesterol medication  Call us if needed [phone]   Important lifestyle changes to control high blood pressure  Intervention  Effect on the BP   Weight loss Weight loss is one of the most effective lifestyle changes for controlling blood pressure. If you're overweight or obese, losing even a small amount of weight can help reduce blood pressure.    Blood pressure can decrease by 1 millimeter of mercury (mmHg) with each kilogram (about 2.2 pounds) of weight lost.   Exercise regularly As a general goal, aim for 30 minutes of moderate physical activity every day.    Regular physical activity can lower blood pressure by 5 - 8 mmHg.   Eat a healthy diet Eat a diet rich in whole grains, fruits, vegetables, lean meat, and low-fat dairy products. Limit processed foods, saturated fat, and sweets.    A heart-healthy diet can lower high blood pressure by 10 mmHg.   Reduce salt (sodium) in your diet Aim for 000mg  of sodium each day. Avoid deli meats, canned food, and frozen microwave meals which are high in sodium.     Limiting sodium can reduce blood pressure by 5 mmHg.   Limit alcohol One drink equals 12 ounces of beer, 5 ounces of wine, or 1.5 ounces of 80-proof liquor.    Limiting alcohol to < 1 drink a day for women or < 2 drinks a day for men can help lower blood pressure by about 4 mmHg.   To check your pressure at home you will need to:   Sit up in a chair, with feet flat on the floor and back supported. Do not cross your ankles or legs. Rest your left arm so that the cuff is about heart level. If the cuff goes on your upper arm, then just relax your arm on the table, arm of the chair, or your lap. If you have a wrist cuff, hold your wrist against  your chest at heart level. Place the cuff snugly around your arm, about 1 inch above the crease of your elbow. The cords should be inside the groove of your elbow.  Sit quietly, with the cuff in place, for about 5 minutes. Then press the power button to start a reading. Do not talk or move while the reading is taking place.  Record your readings on a sheet of paper. Although most cuffs have a memory, it is often easier to see a pattern developing when the numbers are all in front of you.  You can repeat the reading after 1-3 minutes if it is recommended.   Make sure your bladder is empty and you have not had caffeine or tobacco within the last 30 minutes   Always bring your blood pressure log with you to your appointments. If you have not brought your monitor in to be double checked for accuracy, please bring it to your next appointment.   You can find a list of validated (accurate) blood pressure cuffs at: validatebp.org

## 2023-03-06 NOTE — Assessment & Plan Note (Addendum)
Assessment: Patients BP in office is 134/70 and 136/68 with a HR of 64. Patient states home BP says around the same at home and a little lower.  Currently on amlodipine 10 mg daily, telmisartan 80 mg daily and hydrochlorothiazide 12.5 mg daily Lasix 40 mg PRN.  Stated he has had some swelling in past, but not lately. Not had to use PRN Lasix. Denied SOB, light headedness, or dizziness. I am hesitant to increase hydrochlorothiazide d/t hx of electrolyte abnormalities Discussed having a less stringent BP goal with systolic <140  Patient has decreased the amount of cigarettes from 8 cigarettes/d to 0.5 packs/week. Also, cut back coffee intake to 3 cups/d and it still cutting with decaf. Encouraged patient to continue cutting back smoking. Reported stopping Eliquis 2.5mg  BID 3 weeks ago and restarted this AM. Patient slightly confused if he stopped atorvastatin or not. Later stated he takes it every night. PCP is aware he stopped Eliquis. Was on for PE/DVT in the setting of COVID 4 years ago. Patients daughter takes care of weekly pill box.   Plan:  Continue taking amlodipine 10 mg daily, telmisartan 80 mg daily and hydrochlorothiazide 12.5 mg daily Lasix 40 mg PRN. Cut down more on smoking and caffeine Patient is to call if he has any questions and if systolic pressure is consistently >140.

## 2023-04-26 ENCOUNTER — Other Ambulatory Visit: Payer: Self-pay | Admitting: Interventional Cardiology

## 2023-07-17 ENCOUNTER — Encounter (HOSPITAL_BASED_OUTPATIENT_CLINIC_OR_DEPARTMENT_OTHER): Payer: Self-pay

## 2023-07-17 ENCOUNTER — Other Ambulatory Visit: Payer: Self-pay

## 2023-07-17 ENCOUNTER — Emergency Department (HOSPITAL_BASED_OUTPATIENT_CLINIC_OR_DEPARTMENT_OTHER)
Admission: EM | Admit: 2023-07-17 | Discharge: 2023-07-17 | Disposition: A | Discharge status: Home / Self Care | Attending: Emergency Medicine | Admitting: Emergency Medicine

## 2023-07-17 DIAGNOSIS — R799 Abnormal finding of blood chemistry, unspecified: Secondary | ICD-10-CM | POA: Diagnosis present

## 2023-07-17 DIAGNOSIS — Z7901 Long term (current) use of anticoagulants: Secondary | ICD-10-CM | POA: Insufficient documentation

## 2023-07-17 DIAGNOSIS — E876 Hypokalemia: Secondary | ICD-10-CM | POA: Insufficient documentation

## 2023-07-17 LAB — CBC WITH DIFFERENTIAL/PLATELET
Abs Immature Granulocytes: 0.06 10*3/uL (ref 0.00–0.07)
Basophils Absolute: 0 10*3/uL (ref 0.0–0.1)
Basophils Relative: 0 %
Eosinophils Absolute: 0 10*3/uL (ref 0.0–0.5)
Eosinophils Relative: 0 %
HCT: 32.8 % — ABNORMAL LOW (ref 39.0–52.0)
Hemoglobin: 11.6 g/dL — ABNORMAL LOW (ref 13.0–17.0)
Immature Granulocytes: 1 %
Lymphocytes Relative: 5 %
Lymphs Abs: 0.7 10*3/uL (ref 0.7–4.0)
MCH: 31.5 pg (ref 26.0–34.0)
MCHC: 35.4 g/dL (ref 30.0–36.0)
MCV: 89.1 fL (ref 80.0–100.0)
Monocytes Absolute: 0.3 10*3/uL (ref 0.1–1.0)
Monocytes Relative: 2 %
Neutro Abs: 12.1 10*3/uL — ABNORMAL HIGH (ref 1.7–7.7)
Neutrophils Relative %: 92 %
Platelets: 231 10*3/uL (ref 150–400)
RBC: 3.68 MIL/uL — ABNORMAL LOW (ref 4.22–5.81)
RDW: 13.2 % (ref 11.5–15.5)
WBC: 13.3 10*3/uL — ABNORMAL HIGH (ref 4.0–10.5)
nRBC: 0 % (ref 0.0–0.2)

## 2023-07-17 LAB — COMPREHENSIVE METABOLIC PANEL WITH GFR
ALT: 12 U/L (ref 0–44)
AST: 15 U/L (ref 15–41)
Albumin: 3.8 g/dL (ref 3.5–5.0)
Alkaline Phosphatase: 50 U/L (ref 38–126)
Anion gap: 15 (ref 5–15)
BUN: 37 mg/dL — ABNORMAL HIGH (ref 8–23)
CO2: 23 mmol/L (ref 22–32)
Calcium: 6.7 mg/dL — ABNORMAL LOW (ref 8.9–10.3)
Chloride: 96 mmol/L — ABNORMAL LOW (ref 98–111)
Creatinine, Ser: 1.64 mg/dL — ABNORMAL HIGH (ref 0.61–1.24)
GFR, Estimated: 41 mL/min — ABNORMAL LOW (ref >60)
Glucose, Bld: 188 mg/dL — ABNORMAL HIGH (ref 70–99)
Potassium: 3 mmol/L — ABNORMAL LOW (ref 3.5–5.1)
Sodium: 134 mmol/L — ABNORMAL LOW (ref 135–145)
Total Bilirubin: 0.4 mg/dL (ref 0.0–1.2)
Total Protein: 7.5 g/dL (ref 6.5–8.1)

## 2023-07-17 MED ORDER — SODIUM CHLORIDE 0.9 % IV BOLUS
1000.0000 mL | Freq: Once | INTRAVENOUS | Status: AC
  Administered 2023-07-17: 1000 mL via INTRAVENOUS

## 2023-07-17 MED ORDER — POTASSIUM CHLORIDE CRYS ER 20 MEQ PO TBCR: 20.0000 meq | EXTENDED_RELEASE_TABLET | Freq: Two times a day (BID) | ORAL | 0 refills | Status: AC

## 2023-07-17 MED ORDER — POTASSIUM CHLORIDE CRYS ER 20 MEQ PO TBCR
40.0000 meq | EXTENDED_RELEASE_TABLET | Freq: Once | ORAL | Status: AC
  Administered 2023-07-17: 40 meq via ORAL
  Filled 2023-07-17: qty 2

## 2023-07-17 MED ORDER — CALCIUM GLUCONATE-NACL 2-0.675 GM/100ML-% IV SOLN
2.0000 g | Freq: Once | INTRAVENOUS | Status: DC
Start: 2023-07-17 — End: 2023-07-17

## 2023-07-17 MED ORDER — CALCIUM GLUCONATE-NACL 1-0.675 GM/50ML-% IV SOLN
1.0000 g | Freq: Once | INTRAVENOUS | Status: AC
  Administered 2023-07-17: 1000 mg via INTRAVENOUS
  Filled 2023-07-17: qty 50

## 2023-07-17 MED ORDER — POTASSIUM CHLORIDE 10 MEQ/100ML IV SOLN
10.0000 meq | Freq: Once | INTRAVENOUS | Status: AC
  Administered 2023-07-17: 10 meq via INTRAVENOUS
  Filled 2023-07-17: qty 100

## 2023-07-17 NOTE — Discharge Instructions (Signed)
 Double your calcium supplementation for the next 3 days and discontinue her Lasix for 3 days and follow-up with your primary care doctor to recheck your labs.  I have also added some potassium for you to take the next few days as well.  Please return if you develop muscle spasms, seizure-like activity other concerning symptoms.  My hope is that with IV repletion today of these electrolytes think should start going in the right direction.

## 2023-07-17 NOTE — ED Triage Notes (Addendum)
 Patient arrives ambulatory to the ED with complaints of low calcium level. Patient had his labs drawn at his PCP and they called him this morning with the critical labs. Also reports fatigue.

## 2023-07-17 NOTE — ED Notes (Signed)
 Pt given discharge instructions and reviewed prescriptions. Opportunities given for questions. Pt verbalizes understanding. PIV removed x1. Jillyn Hidden, RN

## 2023-07-17 NOTE — ED Provider Notes (Signed)
 [facility] AT [facility] Provider Note   CSN: [id number] Arrival date & time: 07/17/23  4098     History  Chief Complaint  Patient presents with   Abnormal Lab    Andrew Hunt is a 83 y.o. male.  Patient here for low calcium level.  He is chronically on calcium.  Was seen by his primary care doctor for right hand pain that he is been dealing with he was given steroids and is feeling much better.  He is on hydrochlorothiazide.  Denies any seizures muscle spasms.  Denies any abdominal pain nausea vomiting diarrhea.  He is overall asymptomatic.  The history is provided by the patient.       Home Medications Prior to Admission medications   Medication Sig Start Date End Date Taking? Authorizing Provider  magnesium oxide (MAG-OX) 400 MG tablet Take 400 mg by mouth daily. 12/25/22  Yes [provider]  methylPREDNISolone (MEDROL DOSEPAK) 4 MG TBPK tablet See admin instructions. 07/16/23  Yes [provider]  potassium chloride SA (KLOR-CON M) 20 MEQ tablet Take 1 tablet (20 mEq total) by mouth 2 (two) times daily for 3 days. 07/17/23 07/20/23 Yes Krisy Dix, DO  traMADol (ULTRAM) 50 MG tablet Take 50 mg by mouth every 8 (eight) hours as needed. 07/14/23 07/19/23 Yes [provider]  varenicline (CHANTIX) 1 MG tablet Take 1 mg by mouth 2 (two) times daily. 07/16/23  Yes [provider]  acetaminophen (TYLENOL) 325 MG tablet Take 2 tablets (650 mg total) by mouth every 6 (six) hours as needed for mild pain, fever or headache (or Fever >/= 101). 04/09/18   Shon Hale, MD  amLODipine (NORVASC) 10 MG tablet Take 10 mg by mouth daily.    [provider]  apixaban (ELIQUIS) 2.5 MG TABS tablet Take 1 tablet (2.5 mg total) by mouth 2 (two) times daily. 12/13/22   Rocky Morel, DO  atorvastatin (LIPITOR) 80 MG tablet Take 1 tablet (80 mg total) by mouth daily. 12/14/22   Rocky Morel, DO  furosemide (LASIX) 40 MG  tablet TAKE 1 TABLET BY MOUTH EVERY DAY 04/26/23   Malena Peer D, RPH-CPP  Glucosamine-Chondroit-Vit C-Mn (GLUCOSAMINE 1500 COMPLEX PO) Take 1,500 mg by mouth daily.    [provider]  hydrochlorothiazide (HYDRODIURIL) 12.5 MG tablet Take 1 tablet (12.5 mg total) by mouth daily. 12/25/22   Corky Crafts, MD  Magnesium 400 MG TABS Take 400 mg by mouth daily. 12/25/22   Corky Crafts, MD  omeprazole (PRILOSEC) 20 MG capsule Take 20 mg by mouth daily.    [provider]  tamsulosin (FLOMAX) 0.4 MG CAPS capsule Take 0.4 mg by mouth 2 (two) times daily.    [provider]  telmisartan (MICARDIS) 80 MG tablet Take 1 tablet (80 mg total) by mouth daily. 10/30/22   Corky Crafts, MD  tiZANidine (ZANAFLEX) 4 MG tablet Take 2-4 mg by mouth at bedtime as needed. Patient not taking: Reported on 02/16/2023 01/01/22   [provider]      Allergies    Hydrocodone, Oxycodone, and Vioxx [rofecoxib]    Review of Systems   Review of Systems  Physical Exam Updated Vital Signs BP 126/64   Pulse 69   Temp (!) 97.5 F (36.4 C) (Oral)   Resp 20   Ht 5\' 6"  (1.676 m)   Wt 77.1 kg   SpO2 98%   BMI 27.44 kg/m  Physical Exam Vitals and nursing note reviewed.  Constitutional:      General: He is not in acute distress.    Appearance: He is well-developed. He is not ill-appearing.  HENT:     Head: Normocephalic and atraumatic.     Nose: Nose normal.     Mouth/Throat:     Mouth: Mucous membranes are moist.  Eyes:     Extraocular Movements: Extraocular movements intact.     Conjunctiva/sclera: Conjunctivae normal.     Pupils: Pupils are equal, round, and reactive to light.  Cardiovascular:     Rate and Rhythm: Normal rate and regular rhythm.     Pulses: Normal pulses.     Heart sounds: Normal heart sounds. No murmur heard. Pulmonary:     Effort: Pulmonary effort is normal. No respiratory distress.     Breath sounds: Normal breath sounds.   Abdominal:     General: Abdomen is flat.     Palpations: Abdomen is soft.     Tenderness: There is no abdominal tenderness.  Musculoskeletal:        General: No swelling. Normal range of motion.     Cervical back: Normal range of motion and neck supple.  Skin:    General: Skin is warm and dry.     Capillary Refill: Capillary refill takes less than 2 seconds.  Neurological:     General: No focal deficit present.     Mental Status: He is alert.  Psychiatric:        Mood and Affect: Mood normal.     ED Results / Procedures / Treatments   Labs (all labs ordered are listed, but only abnormal results are displayed) Labs Reviewed  CBC WITH DIFFERENTIAL/PLATELET - Abnormal; Notable for the following components:      Result Value   WBC 13.3 (*)    RBC 3.68 (*)    Hemoglobin 11.6 (*)    HCT 32.8 (*)    Neutro Abs 12.1 (*)    All other components within normal limits  COMPREHENSIVE METABOLIC PANEL WITH GFR - Abnormal; Notable for the following components:   Sodium 134 (*)    Potassium 3.0 (*)    Chloride 96 (*)    Glucose, Bld 188 (*)    BUN 37 (*)    Creatinine, Ser 1.64 (*)    Calcium 6.7 (*)    GFR, Estimated 41 (*)    All other components within normal limits    EKG EKG Interpretation Date/Time:  Tuesday July 17 2023 09:46:03 EDT Ventricular Rate:  70 PR Interval:  196 QRS Duration:  108 QT Interval:  431 QTC Calculation: 466 R Axis:   30  Text Interpretation: Sinus rhythm Confirmed by Virgina Norfolk [id number]) on 07/17/2023 10:01:12 AM  Radiology No results found.  Procedures Procedures    Medications Ordered in ED Medications  calcium gluconate 1 g/ 50 mL sodium chloride IVPB (1,000 mg Intravenous New Bag/Given 07/17/23 1142)    And  calcium gluconate 1 g/ 50 mL sodium chloride IVPB (0 mg Intravenous Stopped 07/17/23 1142)  sodium chloride 0.9 % bolus 1,000 mL (0 mLs Intravenous Stopped 07/17/23 1115)  potassium chloride 10 mEq in 100 mL IVPB (0 mEq Intravenous  Stopped 07/17/23 1142)  potassium chloride SA (KLOR-CON M) CR tablet 40 mEq (40 mEq Oral Given 07/17/23 1032)    ED Course/ Medical Decision Making/ A&P  Medical Decision Making Amount and/or Complexity of Data Reviewed Labs: ordered.  Risk Prescription drug management.   Andrew Hunt is here due to abnormal lab.  Has a history of low calcium was found to have low calcium incidentally on lab work yesterday by his primary care doctor.  He has been dealing with right hand inflammation recently that is gotten much better after dose of steroids yesterday.  He is on a Medrol Dosepak.  Clinically his right hand looks normal.  I do suspect likely some arthritic process.  He had a calcium of 6.8 yesterday looks like corrected is probably accurate.  Recheck today and 6.7.  He has on supplemental calcium already for this.  He also takes Lasix and hydrochlorothiazide.  He has a history of hypertension AAA reflux.  He is overall asymptomatic.  Is not having any spasm seizures or other neurologic compromise.  His EKG shows sinus rhythm.  No ischemic changes.  His potassium was 3.  Lab work was otherwise unremarkable per my review interpretation.  Mild elevation in his kidney function.  Ultimately we will give 2 g IV calcium, p.o. and IV potassium.  Talk with Dr. Katrinka Blazing with hospitalist service.  Who agrees with this plan and I will increase his oral calcium to double the dose for the next 3 days will hold his Lasix the next 3 days to see if we can get the calcium little bit more regulated.  He will follow-up with his primary care doctor to recheck his labs.  At this time I do not think we need to do any admission but he understands return precautions.  Patient discharged in good condition.  This chart was dictated using voice recognition software.  Despite best efforts to proofread,  errors can occur which can change the documentation meaning.         Final Clinical  Impression(s) / ED Diagnoses Final diagnoses:  Hypocalcemia  Hypokalemia    Rx / DC Orders ED Discharge Orders          Ordered    potassium chloride SA (KLOR-CON M) 20 MEQ tablet  2 times daily        07/17/23 1120              [place], DO 07/17/23 1238

## 2023-08-31 ENCOUNTER — Ambulatory Visit: Attending: Cardiology | Admitting: Cardiology

## 2023-08-31 ENCOUNTER — Other Ambulatory Visit (HOSPITAL_COMMUNITY): Payer: Self-pay

## 2023-08-31 ENCOUNTER — Encounter: Payer: Self-pay | Admitting: Cardiology

## 2023-08-31 VITALS — BP 116/68 | HR 71 | Ht 66.0 in | Wt 168.4 lb

## 2023-08-31 DIAGNOSIS — I5032 Chronic diastolic (congestive) heart failure: Secondary | ICD-10-CM | POA: Insufficient documentation

## 2023-08-31 DIAGNOSIS — I10 Essential (primary) hypertension: Secondary | ICD-10-CM | POA: Insufficient documentation

## 2023-08-31 DIAGNOSIS — I7142 Juxtarenal abdominal aortic aneurysm, without rupture: Secondary | ICD-10-CM | POA: Insufficient documentation

## 2023-08-31 DIAGNOSIS — Z720 Tobacco use: Secondary | ICD-10-CM | POA: Diagnosis present

## 2023-08-31 MED ORDER — NICOTINE 14 MG/24HR TD PT24: 14.0000 mg | MEDICATED_PATCH | Freq: Every day | TRANSDERMAL | 1 refills | Status: DC

## 2023-08-31 NOTE — Patient Instructions (Addendum)
 Medication Instructions:  Your physician has recommended you make the following change in your medication: Start nicotine  14 mg patches.  Apply to skin daily. Change daily  *If you need a refill on your cardiac medications before your next appointment, please call your pharmacy*  Lab Work: Have lab work checked today on the first floor--PTH and TSH If you have labs (blood work) drawn today and your tests are completely normal, you will receive your results only by: MyChart Message (if you have MyChart) OR A paper copy in the mail If you have any lab test that is abnormal or we need to change your treatment, we will call you to review the results.  Testing/Procedures: none  Follow-Up: At [facility], you and your health needs are our priority.  As part of our continuing mission to provide you with exceptional heart care, our providers are all part of one team.  This team includes your primary Cardiologist (physician) and Advanced Practice Providers or APPs (Physician Assistants and Nurse Practitioners) who all work together to provide you with the care you need, when you need it.  Your next appointment:   12 month(s)  Provider:   Knox Perl, MD    We recommend signing up for the patient portal called "MyChart".  Sign up information is provided on this After Visit Summary.  MyChart is used to connect with patients for Virtual Visits (Telemedicine).  Patients are able to view lab/test results, encounter notes, upcoming appointments, etc.  Non-urgent messages can be sent to your provider as well.   To learn more about what you can do with MyChart, go to [email or web address].   Other Instructions

## 2023-08-31 NOTE — Progress Notes (Signed)
 Cardiology Office Note:  .   Date:  08/31/2023  ID:  Andrew Hunt, DOB 21-Feb-1941, MRN [id number] PCP: Beam, Conway Dennis, MD  [facility] Providers Cardiologist:  Andrew Perl, MD   History of Present Illness: .   Andrew Hunt is a 83 y.o. 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  There is also three-vessel and coronary atherosclerosis and started on Eliquis .   Discussed the use of AI scribe software for clinical note transcription with the patient, who gave verbal consent to proceed.  History of Present Illness Andrew Hunt is an 83 year old male with an abdominal aortic aneurysm and emphysema who presents for cardiovascular evaluation and smoking cessation support. He is accompanied by his daughter, Andrew Hunt. He has an abdominal aortic aneurysm, last evaluated on February 16, 2023, and found to be stable.   He has emphysema with chronic shortness of breath and cannot walk at a fast pace due to hip tightness. He denies chest pain, chest tightness, or heaviness during activities. He had a pulmonary embolism in 2019 and is on Eliquis . He reports no current chest pain.  He is actively trying to quit smoking and started Chantix  two months ago, reducing his smoking to one pack every three days. He does not smoke inside the house. He experiences discomfort in his hips and back, especially when walking, and frequently changes positions due to discomfort from prolonged sitting.  Labs   Lab Results  Component Value Date   CHOL 209 (H) 12/13/2022   HDL 26 (L) 12/13/2022   LDLCALC 139 (H) 12/13/2022   TRIG 221 (H) 12/13/2022   CHOLHDL 8.0 12/13/2022   Lab Results  Component Value Date   NA 134 (L)  07/17/2023   K 3.0 (L) 07/17/2023   CO2 23 07/17/2023   GLUCOSE 188 (H) 07/17/2023   BUN 37 (H) 07/17/2023   CREATININE 1.64 (H) 07/17/2023   CALCIUM  6.7 (L) 07/17/2023   EGFR 46 (L) 12/21/2022   GFRNONAA 41 (L) 07/17/2023      Latest Ref Rng & Units 07/17/2023    9:29 AM 12/21/2022   10:32 AM 12/13/2022    7:19 AM  BMP  Glucose 70 - 99 mg/dL 409  811  914   BUN 8 - 23 mg/dL 37  30  27   Creatinine 0.61 - 1.24 mg/dL 7.82  9.56  2.13   BUN/Creat Ratio 10 - 24  20    Sodium 135 - 145 mmol/L 134  134  137   Potassium 3.5 - 5.1 mmol/L 3.0  3.9  3.5   Chloride 98 - 111 mmol/L 96  101  102   CO2 22 - 32 mmol/L 23  23  25    Calcium  8.9 - 10.3 mg/dL 6.7  9.0  8.9       Latest Ref Rng & Units 07/17/2023    9:29 AM 12/12/2022   10:41 AM 12/12/2022    9:43 AM  CBC  WBC 4.0 - 10.5 K/uL 13.3   8.9   Hemoglobin 13.0 - 17.0 g/dL 08.6  57.8  46.9    62.9   Hematocrit 39.0 - 52.0 % 32.8  42.0  43.1    44.0   Platelets 150 - 400 K/uL 231   180  Lab Results  Component Value Date   HGBA1C 6.3 (H) 12/13/2022    No results found for: "TSH"  External Labs:  Care everywhere labs 14 2025: Vitamin D 43.5.  Serum glucose 88 mg, BUN 22, creatinine 1.13, EGFR 64 mL, potassium 4.1, calcium  8.0.  Phosphorus 2.2, reduced.  Magnesium  1.0, reduced.  Hb 12.5/HCT 37.0, platelets 279.  A1c 6.4%.  Labs 08/07/2022:  Total cholesterol 118, triglycerides 152, HDL 32, LDL 60.  ROS  Review of Systems  Cardiovascular:  Positive for dyspnea on exertion (chronic and stable). Negative for chest pain and leg swelling.  Musculoskeletal:  Positive for arthritis and back pain.    Physical Exam:   VS:  BP 116/68   Pulse 71   Ht 5\' 6"  (1.676 m)   Wt 168 lb 6.4 oz (76.4 kg)   SpO2 95%   BMI 27.18 kg/m    Wt Readings from Last 3 Encounters:  08/31/23 168 lb 6.4 oz (76.4 kg)  07/17/23 170 lb (77.1 kg)  02/16/23 169 lb 1.6 oz (76.7 kg)    Physical Exam Constitutional:      Appearance: He is obese.   Neck:     Vascular: No carotid bruit or JVD.  Cardiovascular:     Rate and Rhythm: Normal rate and regular rhythm.     Pulses: Intact distal pulses.     Heart sounds: Normal heart sounds. No murmur heard.    No gallop.     Comments: Absent right PT otherwise completely normal vascular examination. Pulmonary:     Effort: Pulmonary effort is normal.     Breath sounds: Normal breath sounds.  Abdominal:     General: Bowel sounds are normal.     Palpations: Abdomen is soft.     Hernia: A hernia is present. Hernia is present in the ventral area.  Musculoskeletal:     Right lower leg: No edema.     Left lower leg: No edema.    Studies Reviewed: Andrew Hunt    ECHOCARDIOGRAM COMPLETE 12/13/2022  1. Left ventricular ejection fraction, by estimation, is 60 to 65%. The left ventricle has normal function. The left ventricle has no regional wall motion abnormalities. There is mild concentric left ventricular hypertrophy. Left ventricular diastolic parameters are consistent with Grade I diastolic dysfunction (impaired relaxation). 2. Right ventricular systolic function is normal. The right ventricular size is normal. There is normal pulmonary artery systolic pressure. 3. The mitral valve is normal in structure. No evidence of mitral valve regurgitation. 4. The aortic valve is tricuspid. There is mild calcification of the aortic valve. Aortic valve regurgitation is not visualized. 5. The inferior vena cava is normal in size with greater than 50% respiratory variability, suggesting right atrial pressure of 3 mmHg.  CT angiogram of the chest and abdomen 03/16/2019: 1. Enlarged, poorly enhancing left kidney with surrounding inflammatory/edematous change suggesting pyelonephritis. 2. 4.5 cm fusiform infrarenal abdominal aortic aneurysm (previously 3.8 cm on 11/02/2011 without complicating features.   Abdominal Aortic Duplex 02/16/2023 Abdominal Aorta: There is evidence of abnormal dilatation of the mid and   distal Abdominal aorta measuring 4.49 x 4.7 cm. The largest aortic diameter remains essentially unchanged compared to prior exam. Previous diameter measurement was  obtained on 01/09/22: 4.5 x 4.8 cm.    EKG:    EKG Interpretation Date/Time:  Friday Aug 31 2023 09:24:08 EDT Ventricular Rate:  71 PR Interval:  190 QRS Duration:  104 QT Interval:  398 QTC Calculation: 432 R Axis:   34  Text Interpretation: EKG 08/31/2023: Normal sinus rhythm with rate of 71 bpm, normal axis, incomplete right bundle branch block.  Single PVC.  Compared to 07/17/2023, nonspecific ST depressions in the lateral leads not present. Confirmed by Adonis Yim, Jagadeesh ([id number]) on 08/31/2023 9:32:10 AM    Medications and allergies    Allergies  Allergen Reactions   Hydrocodone  Itching   Oxycodone  Itching   Vioxx [Rofecoxib] Swelling and Other (See Comments)    Caused mini-strokes     Current Outpatient Medications:    acetaminophen  (TYLENOL ) 325 MG tablet, Take 2 tablets (650 mg total) by mouth every 6 (six) hours as needed for mild pain, fever or headache (or Fever >/= 101)., Disp: 30 tablet, Rfl: 1   amLODipine  (NORVASC ) 10 MG tablet, Take 10 mg by mouth daily., Disp: , Rfl:    apixaban  (ELIQUIS ) 2.5 MG TABS tablet, Take 1 tablet (2.5 mg total) by mouth 2 (two) times daily., Disp: 60 tablet, Rfl: 11   atorvastatin  (LIPITOR) 80 MG tablet, Take 1 tablet (80 mg total) by mouth daily., Disp: 30 tablet, Rfl: 0   furosemide  (LASIX ) 40 MG tablet, TAKE 1 TABLET BY MOUTH EVERY DAY, Disp: 90 tablet, Rfl: 3   Glucosamine-Chondroit-Vit C-Mn (GLUCOSAMINE 1500 COMPLEX PO), Take 1,500 mg by mouth daily., Disp: , Rfl:    hydrochlorothiazide  (HYDRODIURIL ) 12.5 MG tablet, Take 1 tablet (12.5 mg total) by mouth daily., Disp: , Rfl:    Magnesium  400 MG TABS, Take 400 mg by mouth daily., Disp: , Rfl:    nicotine (NICODERM CQ - DOSED IN MG/24 HOURS) 14 mg/24hr patch, Place 1 patch (14 mg total) onto the skin daily., Disp: 30 patch, Rfl:  1   omeprazole (PRILOSEC) 20 MG capsule, Take 20 mg by mouth daily., Disp: , Rfl:    tamsulosin  (FLOMAX ) 0.4 MG CAPS capsule, Take 0.4 mg by mouth 2 (two) times daily., Disp: , Rfl:    telmisartan  (MICARDIS ) 80 MG tablet, Take 1 tablet (80 mg total) by mouth daily., Disp: 90 tablet, Rfl: 3   varenicline  (CHANTIX ) 1 MG tablet, Take 1 mg by mouth 2 (two) times daily., Disp: , Rfl:    potassium chloride  SA (KLOR-CON  M) 20 MEQ tablet, Take 1 tablet (20 mEq total) by mouth 2 (two) times daily for 3 days., Disp: 6 tablet, Rfl: 0   Meds ordered this encounter  Medications   nicotine (NICODERM CQ - DOSED IN MG/24 HOURS) 14 mg/24hr patch    Sig: Place 1 patch (14 mg total) onto the skin daily.    Dispense:  30 patch    Refill:  1     Medications Discontinued During This Encounter  Medication Reason   magnesium  oxide (MAG-OX) 400 MG tablet Discontinued by provider   methylPREDNISolone (MEDROL DOSEPAK) 4 MG TBPK tablet Discontinued by provider   tiZANidine (ZANAFLEX) 4 MG tablet Discontinued by provider     ASSESSMENT AND PLAN: .      ICD-10-CM   1. Chronic diastolic congestive heart failure (HCC)  I50.32 EKG 12-Lead    TSH    2. Primary hypertension  I10 TSH    3. 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 Tobacco abuse  Z72.0     5. Hypocalcemia  E83.51 PTH, Intact and Calcium     TSH      Assessment and Plan Assessment & Plan Abdominal aortic aneurysm Well-managed abdominal aortic aneurysm with no changes since the scan on February 16, 2023. Risk of rupture  discussed, especially if severe abdominal or back pain occurs. He follows vascular surgery. - Repeat abdominal scan in one year - Instruct to seek emergency care if experiencing severe abdominal or back pain  Chronic diastolic heart failure Mild chronic diastolic heart failure. Advised to avoid high sodium foods to manage condition and prevent fluid retention. - Advise to avoid high sodium  foods, including fast foods and canned soups  Chronic obstructive pulmonary disease (COPD) Chronic shortness of breath due to COPD, exacerbated by long-term smoking history. Currently on Chantix  and has reduced smoking significantly. Smoking cessation is crucial to prevent further deterioration of lung function. - Prescribe nicotine patches to aid smoking cessation - Encourage continued smoking cessation efforts  Tobacco use disorder Significant reduction in smoking with the aid of Chantix . Encouraged to set a quit date and continue efforts to quit smoking entirely. Smoking cessation is critical due to multiple health issues including COPD and risk of aneurysm rupture. - Prescribe nicotine patches - Encourage setting a quit date, potentially by July 4th  Pulmonary embolism on anticoagulation Pulmonary embolism managed with Eliquis  for anticoagulation. - Continue Eliquis  for anticoagulation  Hypocalcemia Low calcium  levels noted. Plan to investigate further with specific blood tests, PTH will be ordered.  Along with his TSH will be ordered.  I did extensive research on his charts and I do not see these tests ordered.  Advised him from cardiac standpoint I will see him back in a year and if he remains stable on a as needed basis.  Although he complained of hip pain, vascular examination fortunately in spite of heavy smoking is completely normal with excellent bounding pulses.  The only pulse absent was right PT. - Order blood test to investigate cause of hypocalcemia     Signed,  Andrew Perl, MD, [facility] 08/31/2023, 9:55 AM [facility] [address] [place], [place] [address] Phone: [phone]. Fax:  [phone]

## 2023-09-01 ENCOUNTER — Ambulatory Visit: Payer: Self-pay | Admitting: Cardiology

## 2023-09-01 LAB — PTH, INTACT AND CALCIUM
Calcium: 8.4 mg/dL — ABNORMAL LOW (ref 8.6–10.2)
PTH: 43 pg/mL (ref 15–65)

## 2023-09-01 LAB — TSH: TSH: 2.83 u[IU]/mL (ref 0.450–4.500)

## 2023-09-01 NOTE — Progress Notes (Signed)
 Let patient know PTH is normal and I still do not know reason for low calcium , will refer to endocrinology.

## 2023-10-03 ENCOUNTER — Other Ambulatory Visit: Payer: Self-pay | Admitting: Cardiology

## 2023-10-08 ENCOUNTER — Other Ambulatory Visit: Payer: Self-pay | Admitting: Interventional Cardiology

## 2023-10-27 ENCOUNTER — Other Ambulatory Visit: Payer: Self-pay | Admitting: Interventional Cardiology

## 2023-11-14 ENCOUNTER — Other Ambulatory Visit: Payer: Self-pay | Admitting: Cardiology

## 2023-12-11 ENCOUNTER — Telehealth: Payer: Self-pay | Admitting: Cardiology

## 2023-12-11 DIAGNOSIS — Z720 Tobacco use: Secondary | ICD-10-CM

## 2023-12-11 MED ORDER — NICOTINE 14 MG/24HR TD PT24: 14.0000 mg | MEDICATED_PATCH | Freq: Every day | TRANSDERMAL | 1 refills | Status: AC

## 2023-12-11 NOTE — Telephone Encounter (Signed)
 Pt c/o medication issue:  1. Name of Medication:   nicotine  (GNP NICOTINE ) 7 mg/24hr patch    2. How are you currently taking this medication (dosage and times per day)?   PLACE 1 PATCH ONTO THE SKIN DAILY    3. Are you having a reaction (difficulty breathing--STAT)? No  4. What is your medication issue? Patients daughter states her father has not been doing well with 7 mg patch. Would like to discuss a higher dosage. Please advise

## 2023-12-11 NOTE — Telephone Encounter (Signed)
 ICD-10-CM   1. Tobacco abuse  Z72.0 nicotine  (NICODERM CQ  - DOSED IN MG/24 HOURS) 14 mg/24hr patch     No orders of the defined types were placed in this encounter.   Meds ordered this encounter  Medications   nicotine  (NICODERM CQ  - DOSED IN MG/24 HOURS) 14 mg/24hr patch    Sig: Place 1 patch (14 mg total) onto the skin daily.    Dispense:  28 patch    Refill:  1   Medications Discontinued During This Encounter  Medication Reason   nicotine  (GNP NICOTINE ) 7 mg/24hr patch Dose change

## 2023-12-11 NOTE — Telephone Encounter (Signed)
 Patient's daughter Cherlyn reports patient is not doing well on 7 mg nicotine  patch. She states he is reporting more cravings and has started smoking again.  Lorie states patient did much better on 14 mg dosage and is asking if the dose can be increased. If so, Rx to [facility].  Will forward to Dr. Ladona to review and advise.

## 2023-12-14 ENCOUNTER — Other Ambulatory Visit: Payer: Self-pay | Admitting: Student

## 2023-12-17 ENCOUNTER — Other Ambulatory Visit: Payer: Self-pay | Admitting: Student

## 2023-12-17 NOTE — Telephone Encounter (Signed)
 Not [facility] patient

## 2024-01-08 ENCOUNTER — Other Ambulatory Visit: Payer: Self-pay | Admitting: Pharmacist

## 2024-01-08 ENCOUNTER — Other Ambulatory Visit: Payer: Self-pay | Admitting: Cardiology

## 2024-01-08 DIAGNOSIS — I2693 Single subsegmental pulmonary embolism without acute cor pulmonale: Secondary | ICD-10-CM

## 2024-01-08 MED ORDER — APIXABAN 2.5 MG PO TABS: 2.5000 mg | ORAL_TABLET | Freq: Two times a day (BID) | ORAL | 11 refills | Status: AC

## 2024-01-08 NOTE — Telephone Encounter (Signed)
 Prescription refill request for Eliquis  received. Indication:  unprovoked PE Last office visit:  08/31/2023 Scr:  1.64  last lab 07/17/2023 Age: 83 yrs. Weight: 76.4 kg  Referring to pharmD to make sure pt is on correct dose for unprovoked PE. Please advise

## 2024-01-08 NOTE — Telephone Encounter (Signed)
*  STAT* If patient is at the pharmacy, call can be transferred to refill team.   1. Which medications need to be refilled? (please list name of each medication and dose if known) apixaban  (ELIQUIS ) 2.5 MG TABS tablet    2. Would you like to learn more about the convenience, safety, & potential cost savings by using the [facility]?      3. Are you open to using the [facility] (Type [facility]. ).   4. Which pharmacy/location (including street and city if local pharmacy) is medication to be sent to? [facility] - [place], [place] - [address]    5. Do they need a 30 day or 90 day supply? 90 day

## 2024-01-24 ENCOUNTER — Other Ambulatory Visit: Payer: Self-pay | Admitting: Cardiology

## 2024-02-18 ENCOUNTER — Ambulatory Visit (INDEPENDENT_AMBULATORY_CARE_PROVIDER_SITE_OTHER): Admitting: "Endocrinology

## 2024-02-18 ENCOUNTER — Encounter: Payer: Self-pay | Admitting: "Endocrinology

## 2024-02-18 DIAGNOSIS — E876 Hypokalemia: Secondary | ICD-10-CM | POA: Diagnosis not present

## 2024-02-18 LAB — COMPREHENSIVE METABOLIC PANEL WITH GFR
AG Ratio: 1.4 (calc) (ref 1.0–2.5)
ALT: 14 U/L (ref 9–46)
AST: 14 U/L (ref 10–35)
Albumin: 4.2 g/dL (ref 3.6–5.1)
Alkaline phosphatase (APISO): 54 U/L (ref 35–144)
BUN/Creatinine Ratio: 17 (calc) (ref 6–22)
BUN: 27 mg/dL — ABNORMAL HIGH (ref 7–25)
CO2: 28 mmol/L (ref 20–32)
Calcium: 9.5 mg/dL (ref 8.6–10.3)
Chloride: 98 mmol/L (ref 98–110)
Creat: 1.55 mg/dL — ABNORMAL HIGH (ref 0.70–1.22)
Globulin: 3 g/dL (ref 1.9–3.7)
Glucose, Bld: 103 mg/dL — ABNORMAL HIGH (ref 65–99)
Potassium: 4.2 mmol/L (ref 3.5–5.3)
Sodium: 134 mmol/L — ABNORMAL LOW (ref 135–146)
Total Bilirubin: 0.5 mg/dL (ref 0.2–1.2)
Total Protein: 7.2 g/dL (ref 6.1–8.1)
eGFR: 44 mL/min/1.73m2 — ABNORMAL LOW (ref >=60)

## 2024-02-18 LAB — MAGNESIUM: Magnesium: 1.6 mg/dL (ref 1.5–2.5)

## 2024-02-18 NOTE — Progress Notes (Signed)
 Outpatient Endocrinology Note Andrew Birmingham, MD    Andrew Hunt October 16, 1940 [id number]  Referring Provider: Ladona Heinz, MD Primary Care Provider: Beam, Lamar POUR, MD Reason for consultation: Subjective   Assessment & Plan  Diagnoses and all orders for this visit:  Hypocalcemia -     Comprehensive metabolic panel with GFR -     Magnesium  -     Ambulatory referral to Nephrology  Hypomagnesemia -     Comprehensive metabolic panel with GFR -     Magnesium  -     Ambulatory referral to Nephrology  Hypokalemia -     Ambulatory referral to Nephrology  Dropping GFR/CKD 3, low magnesium  (0.8 at one point), low calcium  (6.9 corrected), low potassium (3), on lasix + hydrochlorothiazide  All labs were lab normal but suspect diuretics induced low levels of above Referred to nephrology He is taking magnesium  oxide 400mg  1 tab bid He was taking calcium  OTC, not anymore PTH/Vit D WNL checked recently Ordered lab today  Return for labs today.   I have reviewed current medications, nurse's notes, allergies, vital signs, past medical and surgical history, family medical history, and social history for this encounter. Counseled patient on symptoms, examination findings, lab findings, imaging results, treatment decisions and monitoring and prognosis. The patient understood the recommendations and agrees with the treatment plan. All questions regarding treatment plan were fully answered.  Andrew Birmingham, MD  02/18/24   History of Present Illness HPI  Andrew Hunt is a 83 y.o. year old male who presents for evaluation of hypocalcemia.  When calcium  was low, he reported having numbness in hand and feet (no perioral/per-ungual numbness), also had cramps in legs and feet. They have improved but not got away.  No seizures He is taking magnesium  oxide 400mg  1 tab bid He was taking calcium  OTC, not anymore No known low calcium  history but mother had history of bad cramps He is on  hydrochlorothiazide  12.5mg  and furosemide  40mg  every day   Reviewed [facility] labs: 10/26/2023 25 Vit D 67.6 PTH 5 2 Magnesium  0.8  11/09/2023 Mg 1.4 Calcium  9.4 GFR 55  01/10/2024 Calcium  9.3 Albumin 4 GFR 45  Physical Exam  BP 120/86   Pulse 86   Ht 5' 6 (1.676 m)   Wt 176 lb (79.8 kg)   SpO2 99%   BMI 28.41 kg/m    Constitutional: well developed, well nourished Head: normocephalic, atraumatic Eyes: sclera anicteric, no redness Neck: supple Lungs: normal respiratory effort Neurology: alert and oriented Skin: dry, no appreciable rashes Musculoskeletal: no appreciable defects Psychiatric: normal mood and affect   Current Medications Patient's Medications  New Prescriptions   No medications on file  Previous Medications   ACETAMINOPHEN  (TYLENOL ) 325 MG TABLET    Take 2 tablets (650 mg total) by mouth every 6 (six) hours as needed for mild pain, fever or headache (or Fever >/= 101).   ALLOPURINOL (ZYLOPRIM) 100 MG TABLET    Take 100 mg by mouth daily.   AMLODIPINE  (NORVASC ) 10 MG TABLET    Take 10 mg by mouth daily.   APIXABAN  (ELIQUIS ) 2.5 MG TABS TABLET    Take 1 tablet (2.5 mg total) by mouth 2 (two) times daily.   ATORVASTATIN  (LIPITOR) 80 MG TABLET    Take 1 tablet (80 mg total) by mouth daily.   FUROSEMIDE  (LASIX ) 40 MG TABLET    TAKE 1 TABLET BY MOUTH EVERY DAY   GLUCOSAMINE-CHONDROIT-VIT C-MN (GLUCOSAMINE 1500 COMPLEX PO)    Take 1,500  mg by mouth daily.   HYDROCHLOROTHIAZIDE  (HYDRODIURIL ) 12.5 MG TABLET    TAKE 1 TABLET BY MOUTH EVERY DAY   MAGNESIUM  400 MG TABS    Take 400 mg by mouth daily.   NICOTINE  (NICODERM CQ  - DOSED IN MG/24 HOURS) 14 MG/24HR PATCH    Place 1 patch (14 mg total) onto the skin daily.   OMEPRAZOLE (PRILOSEC) 20 MG CAPSULE    Take 20 mg by mouth daily.   POTASSIUM CHLORIDE  SA (KLOR-CON  M) 20 MEQ TABLET    Take 1 tablet (20 mEq total) by mouth 2 (two) times daily for 3 days.   TAMSULOSIN  (FLOMAX ) 0.4 MG CAPS CAPSULE    Take 0.4 mg by mouth  2 (two) times daily.   TELMISARTAN  (MICARDIS ) 80 MG TABLET    TAKE 1 TABLET BY MOUTH EVERY DAY   VARENICLINE  (CHANTIX ) 1 MG TABLET    Take 1 mg by mouth 2 (two) times daily.  Modified Medications   No medications on file  Discontinued Medications   No medications on file    Allergies Allergies  Allergen Reactions   Hydrocodone  Itching   Oxycodone  Itching   Vioxx [Rofecoxib] Swelling and Other (See Comments)    Caused mini-strokes    Past Medical History Past Medical History:  Diagnosis Date   AAA (abdominal aortic aneurysm)    Arthritis    GERD (gastroesophageal reflux disease)    Hyperlipidemia    Hypertension    Lung nodule    TIA (transient ischemic attack)     Past Surgical History Past Surgical History:  Procedure Laterality Date   APPENDECTOMY     BACK SURGERY     CHOLECYSTECTOMY     COLONOSCOPY N/A 05/05/2014   Procedure: COLONOSCOPY;  Surgeon: Lamar CHRISTELLA Hollingshead, MD;  [facility];  Service: Endoscopy;  Laterality: N/A;  100   MULTIPLE TOOTH EXTRACTIONS      Family History family history includes Diabetes Mellitus II in his mother; Kidney disease in his mother; Lung cancer in his sister; Seizures in his mother.  Social History Social History   Socioeconomic History   Marital status: Married    Spouse name: Not on file   Number of children: Not on file   Years of education: Not on file   Highest education level: Not on file  Occupational History   Not on file  Tobacco Use   Smoking status: Every Day    Current packs/day: 0.25    Average packs/day: 0.3 packs/day for 62.0 years (15.5 ttl pk-yrs)    Types: Cigarettes   Smokeless tobacco: Never   Tobacco comments:    Approximately 1/3 pack per day currently  Vaping Use   Vaping status: Never Used  Substance and Sexual Activity   Alcohol use: No    Alcohol/week: 0.0 standard drinks of alcohol   Drug use: No   Sexual activity: Not Currently  Other Topics Concern   Not on file  Social  History Narrative   Right handed one level home   Are you right handed or left handed? Right   Are you currently employed ? N   What is your current occupation? Retired   Do you live at home alone? N   Who lives with you?  Wife   What type of home do you live in: 1 story or 2 story? 1       Social Drivers of Corporate Investment Banker Strain: Low Risk  (06/21/2023)   Received from  [facility]   Overall Financial Resource Strain (CARDIA)    Difficulty of Paying Living Expenses: Not hard at all  Food Insecurity: No Food Insecurity (06/21/2023)   Received from [facility]   Hunger Vital Sign    Within the past 12 months, you worried that your food would run out before you got the money to buy more.: Never true    Within the past 12 months, the food you bought just didn't last and you didn't have money to get more.: Never true  Transportation Needs: No Transportation Needs (06/21/2023)   Received from [facility] - Transportation    Lack of Transportation (Medical): No    Lack of Transportation (Non-Medical): No  Physical Activity: Sufficiently Active (07/27/2022)   Received from [facility]   Exercise Vital Sign    On average, how many days per week do you engage in moderate to strenuous exercise (like a brisk walk)?: 5 days    On average, how many minutes do you engage in exercise at this level?: 60 min  Stress: No Stress Concern Present (02/19/2023)   Received from [facility] - Occupational Stress Questionnaire    Feeling of Stress : Not at all  Social Connections: Socially Integrated (02/19/2023)   Received from [facility]   Social Network    How would you rate your social network (family, work, friends)?: Good participation with social networks  Intimate Partner Violence: Not At Risk (02/19/2023)   Received from [facility]   HITS    Over the last 12 months how often did your partner physically hurt you?:  Never    Over the last 12 months how often did your partner insult you or talk down to you?: Never    Over the last 12 months how often did your partner threaten you with physical harm?: Never    Over the last 12 months how often did your partner scream or curse at you?: Never    Lab Results  Component Value Date   CHOL 209 (H) 12/13/2022   Lab Results  Component Value Date   HDL 26 (L) 12/13/2022   Lab Results  Component Value Date   LDLCALC 139 (H) 12/13/2022   Lab Results  Component Value Date   TRIG 221 (H) 12/13/2022   Lab Results  Component Value Date   CHOLHDL 8.0 12/13/2022   Lab Results  Component Value Date   CREATININE 1.64 (H) 07/17/2023   No results found for: GFR    Component Value Date/Time   NA 134 (L) 07/17/2023 0929   NA 134 12/21/2022 1032   K 3.0 (L) 07/17/2023 0929   CL 96 (L) 07/17/2023 0929   CO2 23 07/17/2023 0929   GLUCOSE 188 (H) 07/17/2023 0929   BUN 37 (H) 07/17/2023 0929   BUN 30 (H) 12/21/2022 1032   CREATININE 1.64 (H) 07/17/2023 0929   CALCIUM  8.4 (L) 08/31/2023 1027   PROT 7.5 07/17/2023 0929   ALBUMIN 3.8 07/17/2023 0929   AST 15 07/17/2023 0929   ALT 12 07/17/2023 0929   ALKPHOS 50 07/17/2023 0929   BILITOT 0.4 07/17/2023 0929   GFRNONAA 41 (L) 07/17/2023 0929   GFRAA >60 05/20/2019 1435      Latest Ref Rng & Units 08/31/2023   10:27 AM 07/17/2023    9:29 AM 12/21/2022   10:32 AM  BMP  Glucose 70 - 99 mg/dL  811  896   BUN  8 - 23 mg/dL  37  30   Creatinine 9.38 - 1.24 mg/dL  8.35  8.49   BUN/Creat Ratio 10 - 24   20   Sodium 135 - 145 mmol/L  134  134   Potassium 3.5 - 5.1 mmol/L  3.0  3.9   Chloride 98 - 111 mmol/L  96  101   CO2 22 - 32 mmol/L  23  23   Calcium  8.6 - 10.2 mg/dL 8.4  6.7  9.0        Component Value Date/Time   WBC 13.3 (H) 07/17/2023 0929   RBC 3.68 (L) 07/17/2023 0929   HGB 11.6 (L) 07/17/2023 0929   HCT 32.8 (L) 07/17/2023 0929   PLT 231 07/17/2023 0929   MCV 89.1 07/17/2023 0929   MCH  31.5 07/17/2023 0929   MCHC 35.4 07/17/2023 0929   RDW 13.2 07/17/2023 0929   LYMPHSABS 0.7 07/17/2023 0929   MONOABS 0.3 07/17/2023 0929   EOSABS 0.0 07/17/2023 0929   BASOSABS 0.0 07/17/2023 0929   Lab Results  Component Value Date   TSH 2.830 08/31/2023         Parts of this note may have been dictated using voice recognition software. There may be variances in spelling and vocabulary which are unintentional. Not all errors are proofread. Please notify the dino if any discrepancies are noted or if the meaning of any statement is not clear.

## 2024-03-19 ENCOUNTER — Other Ambulatory Visit: Payer: Self-pay | Admitting: Interventional Cardiology

## 2024-03-24 ENCOUNTER — Other Ambulatory Visit: Payer: Self-pay | Admitting: Nephrology

## 2024-03-24 DIAGNOSIS — N1832 Chronic kidney disease, stage 3b: Secondary | ICD-10-CM

## 2024-04-07 ENCOUNTER — Ambulatory Visit
Admission: RE | Admit: 2024-04-07 | Discharge: 2024-04-07 | Disposition: A | Discharge status: Home / Self Care | Source: Ambulatory Visit | Attending: Nephrology | Admitting: Nephrology

## 2024-04-07 DIAGNOSIS — N1832 Chronic kidney disease, stage 3b: Secondary | ICD-10-CM

## 2024-04-24 ENCOUNTER — Other Ambulatory Visit: Payer: Self-pay | Admitting: *Deleted

## 2024-04-24 DIAGNOSIS — I714 Abdominal aortic aneurysm, without rupture, unspecified: Secondary | ICD-10-CM

## 2024-05-14 NOTE — Progress Notes (Unsigned)
 " HISTORY AND PHYSICAL     CC:  follow up. Requesting Provider:  Beam, Lamar POUR, MD  HPI: This is a 84 y.o. male who is here today for follow up for AAA.  He was initially evaluated by Dr. Gerlean in 2012 for 3.5 cm abdominal aortic aneurysm. He had a CT scan in December 2020 which showed the aneurysm diameter had increased in size to 4.5 cm.   He is on Eliquis  for a DVT and pulmonary embolus that occurred in 2020 while he had COVID-19. He is on a statin. He does have a half brother with a family history of popliteal aneurysms.  He had BLE duplex in 2022 and he did not have popliteal aneurysms.   Pt was last seen 02/16/2023 and at that time, he was not having any new abdominal or back pain.  He was smoking about 1/2 ppd. The AAA measured 4.7cm.   The pt returns today for follow up.  ***  The pt is on a statin for cholesterol management.    The pt is not on an aspirin .    Other AC:  Eliquis  The pt is on CCB, diuretic, ARB for hypertension.  The pt is not on medication diabetes. Tobacco hx:  ***  Pt does *** have family hx of AAA.  Past Medical History:  Diagnosis Date   AAA (abdominal aortic aneurysm)    Arthritis    GERD (gastroesophageal reflux disease)    Hyperlipidemia    Hypertension    Lung nodule    TIA (transient ischemic attack)     Past Surgical History:  Procedure Laterality Date   APPENDECTOMY     BACK SURGERY     CHOLECYSTECTOMY     COLONOSCOPY N/A 05/05/2014   Procedure: COLONOSCOPY;  Surgeon: Lamar CHRISTELLA Hollingshead, MD;  [facility];  Service: Endoscopy;  Laterality: N/A;  100   MULTIPLE TOOTH EXTRACTIONS      Allergies[1]  Current Outpatient Medications  Medication Sig Dispense Refill   acetaminophen  (TYLENOL ) 325 MG tablet Take 2 tablets (650 mg total) by mouth every 6 (six) hours as needed for mild pain, fever or headache (or Fever >/= 101). 30 tablet 1   allopurinol (ZYLOPRIM) 100 MG tablet Take 100 mg by mouth daily.     amLODipine  (NORVASC ) 10 MG  tablet Take 10 mg by mouth daily.     apixaban  (ELIQUIS ) 2.5 MG TABS tablet Take 1 tablet (2.5 mg total) by mouth 2 (two) times daily. 60 tablet 11   atorvastatin  (LIPITOR) 80 MG tablet Take 1 tablet (80 mg total) by mouth daily. 30 tablet 0   furosemide  (LASIX ) 40 MG tablet TAKE 1 TABLET BY MOUTH EVERY DAY 90 tablet 3   Glucosamine-Chondroit-Vit C-Mn (GLUCOSAMINE 1500 COMPLEX PO) Take 1,500 mg by mouth daily.     hydrochlorothiazide  (HYDRODIURIL ) 12.5 MG tablet TAKE 1 TABLET BY MOUTH EVERY DAY 90 tablet 1   Magnesium  400 MG TABS Take 400 mg by mouth daily.     nicotine  (NICODERM CQ  - DOSED IN MG/24 HOURS) 14 mg/24hr patch Place 1 patch (14 mg total) onto the skin daily. 28 patch 1   omeprazole (PRILOSEC) 20 MG capsule Take 20 mg by mouth daily.     potassium chloride  SA (KLOR-CON  M) 20 MEQ tablet Take 1 tablet (20 mEq total) by mouth 2 (two) times daily for 3 days. 6 tablet 0   tamsulosin  (FLOMAX ) 0.4 MG CAPS capsule Take 0.4 mg by mouth 2 (two) times daily.  telmisartan  (MICARDIS ) 80 MG tablet TAKE 1 TABLET BY MOUTH EVERY DAY 90 tablet 3   varenicline  (CHANTIX ) 1 MG tablet Take 1 mg by mouth 2 (two) times daily.     No current facility-administered medications for this visit.    Family History  Problem Relation Age of Onset   Seizures Mother    Diabetes Mellitus II Mother    Kidney disease Mother    Lung cancer Sister     Social History   Socioeconomic History   Marital status: Married    Spouse name: Not on file   Number of children: Not on file   Years of education: Not on file   Highest education level: Not on file  Occupational History   Not on file  Tobacco Use   Smoking status: Every Day    Current packs/day: 0.25    Average packs/day: 0.3 packs/day for 62.0 years (15.5 ttl pk-yrs)    Types: Cigarettes   Smokeless tobacco: Never   Tobacco comments:    Approximately 1/3 pack per day currently  Vaping Use   Vaping status: Never Used  Substance and Sexual Activity    Alcohol use: No    Alcohol/week: 0.0 standard drinks of alcohol   Drug use: No   Sexual activity: Not Currently  Other Topics Concern   Not on file  Social History Narrative   Right handed one level home   Are you right handed or left handed? Right   Are you currently employed ? N   What is your current occupation? Retired   Do you live at home alone? N   Who lives with you?  Wife   What type of home do you live in: 1 story or 2 story? 1       Social Drivers of Health   Tobacco Use: High Risk (04/17/2024)   Received from [facility]   Patient History    Smoking Tobacco Use: Some Days    Smokeless Tobacco Use: Never    Passive Exposure: Past  Financial Resource Strain: Low Risk (04/17/2024)   Received from [facility]   Overall Financial Resource Strain (CARDIA)    How hard is it for you to pay for the very basics like food, housing, medical care, and heating?: Not hard at all  Food Insecurity: No Food Insecurity (04/17/2024)   Received from [facility]   Epic    Within the past 12 months, you worried that your food would run out before you got the money to buy more.: Never true    Within the past 12 months, the food you bought just didn't last and you didn't have money to get more.: Never true  Transportation Needs: No Transportation Needs (04/17/2024)   Received from [facility]    In the past 12 months, has lack of transportation kept you from medical appointments or from getting medications?: No    In the past 12 months, has lack of transportation kept you from meetings, work, or from getting things needed for daily living?: No  Physical Activity: Inactive (04/17/2024)   Received from [facility]   Exercise Vital Sign    On average, how many days per week do you engage in moderate to strenuous exercise (like a brisk walk)?: 0 days    Minutes of Exercise per Session: Not on file  Stress: No Stress Concern Present (02/19/2023)   Received from [facility] of Occupational Health - Occupational  Stress Questionnaire    Feeling of Stress : Not at all  Social Connections: Socially Integrated (02/19/2023)   Received from [facility]   Social Network    How would you rate your social network (family, work, friends)?: Good participation with social networks  Intimate Partner Violence: Not At Risk (02/19/2023)   Received from [facility]   HITS    Over the last 12 months how often did your partner physically hurt you?: Never    Over the last 12 months how often did your partner insult you or talk down to you?: Never    Over the last 12 months how often did your partner threaten you with physical harm?: Never    Over the last 12 months how often did your partner scream or curse at you?: Never  Depression (PHQ2-9): Not on file  Alcohol Screen: Not on file  Housing: Low Risk (04/17/2024)   Received from [facility]    In the last 12 months, was there a time when you were not able to pay the mortgage or rent on time?: No    In the past 12 months, how many times have you moved where you were living?: 0    At any time in the past 12 months, were you homeless or living in a shelter (including now)?: No  Utilities: Not At Risk (04/17/2024)   Received from [facility]    In the past 12 months has the electric, gas, oil, or water  company threatened to shut off services in your home?: No  Health Literacy: Not on file     REVIEW OF SYSTEMS:  *** [X]  denotes positive finding, [ ]  denotes negative finding Cardiac  Comments:  Chest pain or chest pressure:    Shortness of breath upon exertion:    Short of breath when lying flat:    Irregular heart rhythm:        Vascular    Pain in calf, thigh, or hip brought on by ambulation:    Pain in feet at night that wakes you up from your sleep:     Blood clot in your veins:    Leg swelling:         Pulmonary    Oxygen at home:    Productive cough:     Wheezing:          Neurologic    Sudden weakness in arms or legs:     Sudden numbness in arms or legs:     Sudden onset of difficulty speaking or slurred speech:    Temporary loss of vision in one eye:     Problems with dizziness:         Gastrointestinal    Blood in stool:     Vomited blood:         Genitourinary    Burning when urinating:     Blood in urine:        Psychiatric    Major depression:         Hematologic    Bleeding problems:    Problems with blood clotting too easily:        Skin    Rashes or ulcers:        Constitutional    Fever or chills:      PHYSICAL EXAMINATION:  ***  General:  WDWN in NAD; vital signs documented above Gait: Not observed HENT: WNL, normocephalic Pulmonary: normal non-labored breathing  Cardiac: {Desc;  regular/irreg:14544} HR, {With/Without:20273} carotid bruit*** Abdomen: soft, NT; aortic pulse is *** palpable Skin: {With/Without:20273} rashes Vascular Exam/Pulses:  Right Left  Radial {Exam; arterial pulse strength 0-4:30167} {Exam; arterial pulse strength 0-4:30167}  Femoral {Exam; arterial pulse strength 0-4:30167} {Exam; arterial pulse strength 0-4:30167}  Popliteal {Exam; arterial pulse strength 0-4:30167} {Exam; arterial pulse strength 0-4:30167}  DP {Exam; arterial pulse strength 0-4:30167} {Exam; arterial pulse strength 0-4:30167}  PT {Exam; arterial pulse strength 0-4:30167} {Exam; arterial pulse strength 0-4:30167}   Extremities: {With/Without:20273} ischemic changes, {With/Without:20273} Gangrene , {With/Without:20273} cellulitis; {With/Without:20273} open wounds Musculoskeletal: no muscle wasting or atrophy  Neurologic: A&O X 3;  No focal weakness or paresthesias are detected Psychiatric:  The pt has {Desc; normal/abnormal:11317::Normal} affect.   Non-Invasive Vascular Imaging:   AAA Arterial duplex on 05/15/2024: ***  Previous AAA arterial duplex on 02/16/2023: Abdominal Aorta Findings:   +-----------+-------+----------+----------+--------+--------+--------+  Location  AP (cm)Trans (cm)PSV (cm/s)WaveformThrombusComments  +-----------+-------+----------+----------+--------+--------+--------+  Proximal  2.66   2.57      88                                  +-----------+-------+----------+----------+--------+--------+--------+  Mid       3.54   3.92                                          +-----------+-------+----------+----------+--------+--------+--------+  Distal    4.49   4.71      53                                  +-----------+-------+----------+----------+--------+--------+--------+  RT CIA Prox1.0    1.4       145       biphasic                  +-----------+-------+----------+----------+--------+--------+--------+  LT CIA Prox0.9    1.2       174       biphasic                  +-----------+-------+----------+----------+--------+--------+--------+   Summary:  Abdominal Aorta: There is evidence of abnormal dilatation of the mid and distal Abdominal aorta. The largest aortic diameter remains essentially unchanged compared to prior exam. Previous diameter measurement was obtained on 01/09/22: 4.5 x 4.8 cm    ASSESSMENT/PLAN:: 84 y.o. male here for follow up for AAA  -*** -continue statin.  Pt on Eliquis  for hx of PE -discussed with pt if they develop sudden severe abdominal or back pain, they should call 911 -pt will f/u in *** with ***.   Lucie Apt, [facility] [phone]  Clinic MD:   [place]    [1]  Allergies Allergen Reactions   Hydrocodone  Itching   Oxycodone  Itching   Vioxx [Rofecoxib] Swelling and Other (See Comments)    Caused mini-strokes   "

## 2024-05-15 ENCOUNTER — Ambulatory Visit (INDEPENDENT_AMBULATORY_CARE_PROVIDER_SITE_OTHER): Admitting: Physician Assistant

## 2024-05-15 ENCOUNTER — Encounter: Payer: Self-pay | Admitting: Physician Assistant

## 2024-05-15 ENCOUNTER — Ambulatory Visit (HOSPITAL_COMMUNITY)
Admission: RE | Admit: 2024-05-15 | Discharge: 2024-05-15 | Disposition: A | Source: Ambulatory Visit | Attending: Vascular Surgery

## 2024-05-15 VITALS — BP 131/70 | HR 57 | Ht 66.0 in | Wt 172.1 lb

## 2024-05-15 DIAGNOSIS — I714 Abdominal aortic aneurysm, without rupture, unspecified: Secondary | ICD-10-CM | POA: Diagnosis not present

## 2024-05-15 DIAGNOSIS — F17200 Nicotine dependence, unspecified, uncomplicated: Secondary | ICD-10-CM
